# Patient Record
Sex: Male | Born: 1945 | Race: White | Hispanic: No | Marital: Married | Smoking: Never smoker
Health system: Southern US, Community
[De-identification: ages and names within clinical notes are randomized; demographics above are authoritative.]

## PROBLEM LIST (undated history)

## (undated) DIAGNOSIS — E119 Type 2 diabetes mellitus without complications: Secondary | ICD-10-CM

## (undated) DIAGNOSIS — I10 Essential (primary) hypertension: Secondary | ICD-10-CM

## (undated) DIAGNOSIS — E785 Hyperlipidemia, unspecified: Secondary | ICD-10-CM

---

## 2013-01-03 ENCOUNTER — Inpatient Hospital Stay: Payer: Self-pay | Admitting: Internal Medicine

## 2013-01-03 LAB — CK TOTAL AND CKMB (NOT AT ARMC): CK-MB: 2.5 ng/mL (ref 0.5–3.6)

## 2013-01-03 LAB — COMPREHENSIVE METABOLIC PANEL
Alkaline Phosphatase: 123 U/L (ref 50–136)
Bilirubin,Total: 0.4 mg/dL (ref 0.2–1.0)
Calcium, Total: 9.3 mg/dL (ref 8.5–10.1)
Chloride: 102 mmol/L (ref 98–107)
Co2: 26 mmol/L (ref 21–32)
EGFR (Non-African Amer.): >60
Osmolality: 295 (ref 275–301)
SGOT(AST): 15 U/L (ref 15–37)
SGPT (ALT): 22 U/L (ref 12–78)

## 2013-01-03 LAB — CBC
HCT: 49.2 % (ref 40.0–52.0)
HGB: 16.3 g/dL (ref 13.0–18.0)
MCH: 29.5 pg (ref 26.0–34.0)
MCHC: 33.1 g/dL (ref 32.0–36.0)
RBC: 5.53 10*6/uL (ref 4.40–5.90)
RDW: 13.7 % (ref 11.5–14.5)
WBC: 13.1 10*3/uL — ABNORMAL HIGH (ref 3.8–10.6)

## 2013-01-03 LAB — APTT: Activated PTT: <23 secs — ABNORMAL LOW (ref 23.6–35.9)

## 2013-01-03 LAB — LIPASE, BLOOD: Lipase: 116 U/L (ref 73–393)

## 2013-01-03 LAB — TROPONIN I: Troponin-I: <0.02 ng/mL

## 2013-01-03 LAB — MAGNESIUM: Magnesium: 2 mg/dL

## 2013-01-03 LAB — PROTIME-INR: INR: 0.9

## 2013-01-08 LAB — CULTURE, BLOOD (SINGLE)

## 2015-04-06 NOTE — Discharge Summary (Signed)
PATIENT NAME:  Jacob White, Jacob White MR#:  [id number] DATE OF BIRTH:  1946-05-19  DATE OF ADMISSION:  01/03/2013  DATE OF DISCHARGE:  01/03/2013  PRESENTING COMPLAINT: Shortness of breath.   DISCHARGE DIAGNOSES: 1.  Pleuritic chest pain suspected due to bibasilar early pneumonia.  2.  Hypertension.  3.  Diabetes.   SATURATIONS ON DISCHARGE:  96% on room air.   MEDICATIONS:   1.  Metformin 1000 mg b.i.d.  2.  Hydrochlorothiazide/losartan 12.5/50, 1 tablet daily.  3.  Aspirin 325 mg p.o. daily.  4.  Levaquin 750 mg p.o. daily for 5 more days.   DIET: Low sodium, carbohydrate controlled diet.   DISCHARGE INSTRUCTIONS:  Follow up with your primary care physician in [place] in 1 week.   LABORATORY DATA AT DISCHARGE:   1.  Blood cultures negative in 18 to 24 hours.  2.  Troponin x 3 negative.  3.  White count is 13.3, platelet count 146, H and H is normal. D-dimer is 1.86. Lipase is 116. Magnesium 2.0. PT-INR within normal limits. Electrolytes within normal limits except glucose of 386.   IMAGING STUDIES AT DISCHARGE: CT of the chest shows bibasilar atelectasis and/or pneumonia. No PE coronary or CAD disease.   BRIEF SUMMARY OF HOSPITAL COURSE: The patient is a pleasant 69 year old Caucasian gentleman with past medical history of hypertension and diabetes, comes to the Emergency Room with:  1.  Pleuritic chest pain and shortness of breath. The patient was worked up in the Emergency Room and was found to have bibasilar atelectasis versus early pneumonia. His white count was 13K.  He had low-grade fever at home. The patient's blood cultures remained negative. His sats were 96% on room air. He was started on Levaquin, which was changed to p.o. 750 mg. The patient will take 5 more days of Levaquin. He ambulated without any difficulty. Did have some mild shortness of breath.  2.  Hypertension. Continue losartan/hydrochlorothiazide.  3.  Type 2 diabetes. The patient was kept on his metformin. The  patient felt later on in the day well other than mild weakness. He ambulated without any difficulty. He is being discharged to home with followup as outpatient with his primary care physician in [place].    TIME SPENT: 40 minutes.     ____________________________ Wylie HailSona A. Allena KatzPatel, MD sap:dm D: 01/04/2013 07:53:00 ET T: 01/04/2013 09:47:37 ET JOB#: [id number]  cc: Advika Mclelland A. Allena KatzPatel, MD, <Dictator> Willow OraSONA A Marius Betts MD ELECTRONICALLY SIGNED 01/07/2013 21:32

## 2015-04-06 NOTE — H&P (Signed)
PATIENT NAME:  Jacob White, Jacob White MR#:  [id number] DATE OF BIRTH:  [birth year]  DATE OF ADMISSION:  01/03/2013  PRIMARY CARE PHYSICIAN: Does not recall her name, but the office is in [place], 6 miles from here.   REFERRING PHYSICIAN: Dr. Malachy Moan.   CHIEF COMPLAINT: Midsternal chest pain and shortness of breath.   HISTORY OF PRESENT ILLNESS: Jacob White is a 69 year old Caucasian male with a history of hypertension and diabetes mellitus. He was in his usual state of health until last evening around 9:00 p.m. he complained of shortness of breath associated with a sharp stabbing pain on the midsternal area, pleuritic in nature. It comes for just a second or two, and then goes away. Exacerbated by taking a deep breath. The pain radiates to the left neck and left shoulder. During the initial evaluation, he was found to have decreased O2 saturation reaching 87 on room air. The patient was transported by EMS to the hospital. En route, he received nitroglycerin and aspirin; however, the nitroglycerin did not change his chest pain pattern. Evaluation here in the Emergency Department with chest x-ray failed to show pneumonia. However, CTA of the chest was done and that showed no evidence of pulmonary emboli; however, there was some bibasilar nonspecific consolidation, either pneumonia or atelectasis. Blood cultures were taken and the patient was started on intravenous Levaquin.   REVIEW OF SYSTEMS:  CONSTITUTIONAL: Denies having any fever. No chills. No fatigue.  EYES: No blurring of vision. No double vision.  ENT: No hearing impairment. No sore throat. No dysphagia.  CARDIOVASCULAR: Reports sharp, brief chest pains at the mid chest area as above and shortness of breath, especially upon deep breaths. No syncope. No edema.  RESPIRATORY: Reports no cough and no hemoptysis. No sputum production. Reports chest pain and shortness of breath as above.  GASTROINTESTINAL: No abdominal pain, no vomiting and no  diarrhea.  GENITOURINARY: No dysuria. No frequency of urination.  MUSCULOSKELETAL: No joint pain or swelling. No muscular pain or swelling.  INTEGUMENTARY: No skin rash. No ulcers.  NEUROLOGY: No focal weakness. No seizure activity. No headache.  PSYCHIATRY: No anxiety. No depression,  ENDOCRINE: No polyuria or polydipsia. No heat or cold intolerance.  HEMATOLOGY: No easy bruisability. No lymph node enlargement.   PAST MEDICAL HISTORY:  1.  Diabetes mellitus, type 2.  2.  Systemic hypertension.   PAST SURGICAL HISTORY: None.   FAMILY HISTORY: His father died from lung cancer at age of 56. His mother died from a heart attack at the age of 73.   SOCIAL HABITS: Nonsmoker. No history of alcohol abuse. He may drink beer over the weekends.   SOCIAL HISTORY: He is married, living with his wife. He is still walking,  building couches for living rooms.   ADMISSION MEDICATIONS: Metformin 1000 mg twice a day and losartan with hydrochlorothiazide 50/12.5 mg once a day.   ALLERGIES: No known drug allergies.   PHYSICAL EXAMINATION:  VITAL SIGNS: Blood pressure 170/110, respiratory rate 16, pulse 73, temperature 98.2, oxygen saturation 98%.  GENERAL APPEARANCE: Elderly male lying in bed in no acute distress, healthy looking.  HEAD AND NECK: No pallor. No icterus. No cyanosis.  ENT: Ear examination revealed normal hearing, no discharge, no lesions. Nasal examination revealed no ulcers, no lesions, no discharge. Oropharyngeal area was normal without oral thrush, no ulcers, no exudates. He is edentulous and wearing dentures. Eye examination revealed normal eyelids and conjunctivae. Pupils are constricted about 3 to 4 mm, equal in size. I  could not see reactivity to light.  NECK: Supple. Trachea at midline. No thyromegaly. No cervical lymphadenopathy. No masses.  HEART: Normal S1, S2. No S3, S4. No murmur. No gallop. No carotid bruits.  LUNGS: Normal breathing pattern without use of accessory muscles.  No rales. No wheezing. No friction rub.  ABDOMEN: Soft without tenderness. No hepatosplenomegaly. No masses. No hernias.  SKIN: No ulcers. No subcutaneous nodules.  MUSCULOSKELETAL: No joint swelling. No clubbing.  NEUROLOGIC: Cranial nerves II through XII are intact. No focal motor deficit.  PSYCHIATRIC: The patient is alert and oriented x 3. Mood and affect normal.   LABORATORY FINDINGS: Chest x-ray showed no consolidation, no effusion. CTA of the chest showed no evidence of pulmonary emboli. Thoracic aortic is normal. Heart is enlarged. There is bibasilar nonspecific consolidation consistent with pneumonia or atelectasis or edema. No mediastinal or hilar lymphadenopathy. EKG showed normal sinus rhythm at rate of 67 per minute. Premature atrial complex. Poor progression of R waves in the anterior chest leads. Nonspecific ST elevation in the anterior leads, otherwise unremarkable EKG. His serum CPK was normal at 61 with normal CK-MB fraction at 2.5 and normal troponin less than 0.02. Serum glucose 386, BUN 26, creatinine 1.1, sodium 137, potassium 3.8. Lipase 116. Magnesium 2. Normal liver function tests and liver transaminases. CBC showed white count of 13,000, hemoglobin 16, hematocrit 49, platelet count 146. Prothrombin time 12, INR 0.9, APTT 23. D-dimer elevated at 1.8. ABG showed pH of 7.39, pCO2 40, pO2 at 264 and this is on FiO2 of 100%.   ASSESSMENT:  1.  Pleuritic chest pain, brief in nature, but exacerbated with deep breathing with negative  pulmonary embolism by CT of the chest. 2.  Bibasilar consolidation suspected to have pneumonia.  3.  Elevated white blood cell count or leukocytosis, likely from the pneumonia.  4.  Diabetes mellitus, type 2.  5.  Systemic hypertension.   PLAN: Will admit the patient medical floor on telemetry monitoring given his chest pain. Even though the first troponin is normal, I will continue followup on his cardiac enzymes q. 8 hours x 2. Blood cultures x 2  were taken. Intravenous Levaquin is started. For the time being, I will add aspirin and beta blocker using Coreg for additional control of his blood pressure. Continue losartan along with hydrochlorothiazide for his hypertension. Hold metformin for the next 24 to 36 hours due to receiving IV contrast for his CT scan. Place on Accu-Cheks and sliding scale. Oxygen supplementation. The patient's CODE STATUS is full code.   TIME SPENT EVALUATING THIS PATIENT: Took more than 55 minutes.    ____________________________ Carney CornersAmir M. Rudene Rearwish, MD amd:aw D: 01/03/2013 04:37:53 ET T: 01/03/2013 05:39:51 ET JOB#: [id number]  cc: Carney CornersAmir M. Rudene Rearwish, MD, <Dictator> Zollie ScaleAMIR M Adanely Reynoso MD ELECTRONICALLY SIGNED 01/03/2013 7:04

## 2015-05-25 ENCOUNTER — Encounter: Payer: Self-pay | Admitting: Internal Medicine

## 2015-06-08 ENCOUNTER — Emergency Department (HOSPITAL_COMMUNITY): Payer: Medicare Other

## 2015-06-08 ENCOUNTER — Encounter (HOSPITAL_COMMUNITY): Payer: Self-pay | Admitting: Emergency Medicine

## 2015-06-08 ENCOUNTER — Inpatient Hospital Stay (HOSPITAL_COMMUNITY)
Admission: EM | Admit: 2015-06-08 | Discharge: 2015-06-13 | DRG: 065 | Disposition: A | Discharge status: Rehabilitation Facility | Payer: Medicare Other | Attending: Internal Medicine | Admitting: Internal Medicine

## 2015-06-08 DIAGNOSIS — R410 Disorientation, unspecified: Secondary | ICD-10-CM | POA: Diagnosis not present

## 2015-06-08 DIAGNOSIS — I739 Peripheral vascular disease, unspecified: Secondary | ICD-10-CM | POA: Diagnosis present

## 2015-06-08 DIAGNOSIS — R7881 Bacteremia: Secondary | ICD-10-CM | POA: Diagnosis present

## 2015-06-08 DIAGNOSIS — M25511 Pain in right shoulder: Secondary | ICD-10-CM | POA: Diagnosis not present

## 2015-06-08 DIAGNOSIS — I6330 Cerebral infarction due to thrombosis of unspecified cerebral artery: Secondary | ICD-10-CM | POA: Diagnosis present

## 2015-06-08 DIAGNOSIS — G8194 Hemiplegia, unspecified affecting left nondominant side: Secondary | ICD-10-CM | POA: Diagnosis present

## 2015-06-08 DIAGNOSIS — E860 Dehydration: Secondary | ICD-10-CM

## 2015-06-08 DIAGNOSIS — B9562 Methicillin resistant Staphylococcus aureus infection as the cause of diseases classified elsewhere: Secondary | ICD-10-CM | POA: Diagnosis present

## 2015-06-08 DIAGNOSIS — I639 Cerebral infarction, unspecified: Secondary | ICD-10-CM | POA: Diagnosis not present

## 2015-06-08 DIAGNOSIS — G8190 Hemiplegia, unspecified affecting unspecified side: Secondary | ICD-10-CM | POA: Diagnosis not present

## 2015-06-08 DIAGNOSIS — R509 Fever, unspecified: Secondary | ICD-10-CM | POA: Diagnosis not present

## 2015-06-08 DIAGNOSIS — E785 Hyperlipidemia, unspecified: Secondary | ICD-10-CM | POA: Diagnosis present

## 2015-06-08 DIAGNOSIS — E118 Type 2 diabetes mellitus with unspecified complications: Secondary | ICD-10-CM | POA: Diagnosis present

## 2015-06-08 DIAGNOSIS — M79601 Pain in right arm: Secondary | ICD-10-CM | POA: Diagnosis not present

## 2015-06-08 DIAGNOSIS — R1310 Dysphagia, unspecified: Secondary | ICD-10-CM | POA: Diagnosis present

## 2015-06-08 DIAGNOSIS — I10 Essential (primary) hypertension: Secondary | ICD-10-CM | POA: Diagnosis present

## 2015-06-08 DIAGNOSIS — E119 Type 2 diabetes mellitus without complications: Secondary | ICD-10-CM | POA: Diagnosis not present

## 2015-06-08 DIAGNOSIS — Z79899 Other long term (current) drug therapy: Secondary | ICD-10-CM | POA: Diagnosis not present

## 2015-06-08 DIAGNOSIS — R0602 Shortness of breath: Secondary | ICD-10-CM

## 2015-06-08 DIAGNOSIS — E876 Hypokalemia: Secondary | ICD-10-CM | POA: Diagnosis not present

## 2015-06-08 DIAGNOSIS — B9561 Methicillin susceptible Staphylococcus aureus infection as the cause of diseases classified elsewhere: Secondary | ICD-10-CM | POA: Diagnosis not present

## 2015-06-08 DIAGNOSIS — N179 Acute kidney failure, unspecified: Secondary | ICD-10-CM | POA: Diagnosis present

## 2015-06-08 DIAGNOSIS — R0781 Pleurodynia: Secondary | ICD-10-CM | POA: Diagnosis not present

## 2015-06-08 DIAGNOSIS — E871 Hypo-osmolality and hyponatremia: Secondary | ICD-10-CM | POA: Diagnosis present

## 2015-06-08 DIAGNOSIS — R531 Weakness: Secondary | ICD-10-CM

## 2015-06-08 HISTORY — DX: Type 2 diabetes mellitus without complications: E11.9

## 2015-06-08 HISTORY — DX: Essential (primary) hypertension: I10

## 2015-06-08 HISTORY — DX: Hyperlipidemia, unspecified: E78.5

## 2015-06-08 LAB — COMPREHENSIVE METABOLIC PANEL
ALBUMIN: 2.8 g/dL — AB (ref 3.5–5.0)
ALT: 19 U/L (ref 17–63)
AST: 21 U/L (ref 15–41)
Alkaline Phosphatase: 107 U/L (ref 38–126)
Anion gap: 13 (ref 5–15)
BUN: 40 mg/dL — ABNORMAL HIGH (ref 6–20)
CALCIUM: 8.8 mg/dL — AB (ref 8.9–10.3)
CO2: 26 mmol/L (ref 22–32)
Chloride: 91 mmol/L — ABNORMAL LOW (ref 101–111)
Creatinine, Ser: 1.31 mg/dL — ABNORMAL HIGH (ref 0.61–1.24)
GFR calc Af Amer: >60 mL/min (ref >60)
GFR, EST NON AFRICAN AMERICAN: 54 mL/min — AB (ref >60)
GLUCOSE: 240 mg/dL — AB (ref 65–99)
Potassium: 3.7 mmol/L (ref 3.5–5.1)
Sodium: 130 mmol/L — ABNORMAL LOW (ref 135–145)
Total Bilirubin: 0.8 mg/dL (ref 0.3–1.2)
Total Protein: 7.4 g/dL (ref 6.5–8.1)

## 2015-06-08 LAB — CBC WITH DIFFERENTIAL/PLATELET
BASOS PCT: 0 % (ref 0–1)
Basophils Absolute: 0 10*3/uL (ref 0.0–0.1)
EOS ABS: 0 10*3/uL (ref 0.0–0.7)
Eosinophils Relative: 0 % (ref 0–5)
HCT: 39.3 % (ref 39.0–52.0)
Hemoglobin: 13.7 g/dL (ref 13.0–17.0)
Lymphocytes Relative: 5 % — ABNORMAL LOW (ref 12–46)
Lymphs Abs: 0.5 10*3/uL — ABNORMAL LOW (ref 0.7–4.0)
MCH: 29.6 pg (ref 26.0–34.0)
MCHC: 34.9 g/dL (ref 30.0–36.0)
MCV: 84.9 fL (ref 78.0–100.0)
Monocytes Absolute: 0.7 10*3/uL (ref 0.1–1.0)
Monocytes Relative: 8 % (ref 3–12)
Neutro Abs: 8.3 10*3/uL — ABNORMAL HIGH (ref 1.7–7.7)
Neutrophils Relative %: 87 % — ABNORMAL HIGH (ref 43–77)
PLATELETS: 184 10*3/uL (ref 150–400)
RBC: 4.63 MIL/uL (ref 4.22–5.81)
RDW: 13.3 % (ref 11.5–15.5)
WBC: 9.6 10*3/uL (ref 4.0–10.5)

## 2015-06-08 LAB — I-STAT TROPONIN, ED: Troponin i, poc: 0.01 ng/mL (ref 0.00–0.08)

## 2015-06-08 LAB — GLUCOSE, CAPILLARY: Glucose-Capillary: 223 mg/dL — ABNORMAL HIGH (ref 65–99)

## 2015-06-08 LAB — POC OCCULT BLOOD, ED: FECAL OCCULT BLD: NEGATIVE

## 2015-06-08 MED ORDER — SENNOSIDES-DOCUSATE SODIUM 8.6-50 MG PO TABS
1.0000 | ORAL_TABLET | Freq: Every evening | ORAL | Status: DC | PRN
  Administered 2015-06-11: 1 via ORAL
  Filled 2015-06-08: qty 1

## 2015-06-08 MED ORDER — SODIUM CHLORIDE 0.9 % IV SOLN: INTRAVENOUS | Status: AC

## 2015-06-08 MED ORDER — ENSURE ENLIVE PO LIQD
237.0000 mL | Freq: Two times a day (BID) | ORAL | Status: DC
  Administered 2015-06-09 – 2015-06-13 (×5): 237 mL via ORAL

## 2015-06-08 MED ORDER — INSULIN ASPART 100 UNIT/ML ~~LOC~~ SOLN
0.0000 [IU] | Freq: Every day | SUBCUTANEOUS | Status: DC
  Administered 2015-06-08: 2 [IU] via SUBCUTANEOUS

## 2015-06-08 MED ORDER — ONDANSETRON HCL 4 MG PO TABS: 4.0000 mg | ORAL_TABLET | Freq: Four times a day (QID) | ORAL | Status: DC | PRN

## 2015-06-08 MED ORDER — LISINOPRIL 20 MG PO TABS
20.0000 mg | ORAL_TABLET | Freq: Every day | ORAL | Status: DC
  Administered 2015-06-09 – 2015-06-13 (×5): 20 mg via ORAL
  Filled 2015-06-08 (×5): qty 1

## 2015-06-08 MED ORDER — HYDROCHLOROTHIAZIDE 25 MG PO TABS
25.0000 mg | ORAL_TABLET | Freq: Every day | ORAL | Status: DC
  Administered 2015-06-09 – 2015-06-13 (×5): 25 mg via ORAL
  Filled 2015-06-08 (×5): qty 1

## 2015-06-08 MED ORDER — ACETAMINOPHEN 650 MG RE SUPP: 650.0000 mg | Freq: Four times a day (QID) | RECTAL | Status: DC | PRN

## 2015-06-08 MED ORDER — SODIUM CHLORIDE 0.9 % IV SOLN
INTRAVENOUS | Status: DC
  Administered 2015-06-08: via INTRAVENOUS
  Administered 2015-06-09: 75 mL/h via INTRAVENOUS
  Administered 2015-06-09: 06:00:00 via INTRAVENOUS
  Administered 2015-06-10: 75 mL/h via INTRAVENOUS

## 2015-06-08 MED ORDER — METFORMIN HCL 500 MG PO TABS
1000.0000 mg | ORAL_TABLET | Freq: Two times a day (BID) | ORAL | Status: DC
  Administered 2015-06-09 – 2015-06-11 (×5): 1000 mg via ORAL
  Filled 2015-06-08 (×7): qty 2

## 2015-06-08 MED ORDER — OXYCODONE HCL 5 MG PO TABS
5.0000 mg | ORAL_TABLET | ORAL | Status: DC | PRN
  Administered 2015-06-08 – 2015-06-09 (×2): 5 mg via ORAL
  Filled 2015-06-08 (×2): qty 1

## 2015-06-08 MED ORDER — ONDANSETRON HCL 4 MG/2ML IJ SOLN: 4.0000 mg | Freq: Four times a day (QID) | INTRAMUSCULAR | Status: DC | PRN

## 2015-06-08 MED ORDER — ACETAMINOPHEN 325 MG PO TABS
650.0000 mg | ORAL_TABLET | Freq: Four times a day (QID) | ORAL | Status: DC | PRN
  Administered 2015-06-10 – 2015-06-11 (×2): 650 mg via ORAL
  Filled 2015-06-08 (×2): qty 2

## 2015-06-08 MED ORDER — SODIUM CHLORIDE 0.9 % IV BOLUS (SEPSIS)
500.0000 mL | Freq: Once | INTRAVENOUS | Status: AC
  Administered 2015-06-08: 500 mL via INTRAVENOUS

## 2015-06-08 MED ORDER — PRAVASTATIN SODIUM 40 MG PO TABS
40.0000 mg | ORAL_TABLET | Freq: Every day | ORAL | Status: DC
  Administered 2015-06-08 – 2015-06-12 (×5): 40 mg via ORAL
  Filled 2015-06-08 (×6): qty 1

## 2015-06-08 MED ORDER — ACETAMINOPHEN 325 MG PO TABS
650.0000 mg | ORAL_TABLET | Freq: Once | ORAL | Status: AC
  Administered 2015-06-08: 650 mg via ORAL
  Filled 2015-06-08: qty 2

## 2015-06-08 MED ORDER — ALUM & MAG HYDROXIDE-SIMETH 200-200-20 MG/5ML PO SUSP
30.0000 mL | Freq: Four times a day (QID) | ORAL | Status: DC | PRN
Start: 2015-06-08 — End: 2015-06-13

## 2015-06-08 MED ORDER — SODIUM CHLORIDE 0.9 % IJ SOLN
3.0000 mL | Freq: Two times a day (BID) | INTRAMUSCULAR | Status: DC
Start: 2015-06-08 — End: 2015-06-13
  Administered 2015-06-10 – 2015-06-12 (×3): 3 mL via INTRAVENOUS

## 2015-06-08 MED ORDER — ENOXAPARIN SODIUM 40 MG/0.4ML ~~LOC~~ SOLN
40.0000 mg | SUBCUTANEOUS | Status: DC
  Administered 2015-06-08 – 2015-06-12 (×5): 40 mg via SUBCUTANEOUS
  Filled 2015-06-08 (×6): qty 0.4

## 2015-06-08 MED ORDER — LISINOPRIL-HYDROCHLOROTHIAZIDE 20-25 MG PO TABS: 1.0000 | ORAL_TABLET | Freq: Every day | ORAL | Status: DC

## 2015-06-08 MED ORDER — INSULIN ASPART 100 UNIT/ML ~~LOC~~ SOLN
0.0000 [IU] | Freq: Three times a day (TID) | SUBCUTANEOUS | Status: DC
  Administered 2015-06-09: 3 [IU] via SUBCUTANEOUS
  Administered 2015-06-09: 11 [IU] via SUBCUTANEOUS
  Administered 2015-06-09: 5 [IU] via SUBCUTANEOUS
  Administered 2015-06-10 (×2): 3 [IU] via SUBCUTANEOUS
  Administered 2015-06-10: 2 [IU] via SUBCUTANEOUS
  Administered 2015-06-11: 3 [IU] via SUBCUTANEOUS
  Administered 2015-06-11 – 2015-06-12 (×4): 2 [IU] via SUBCUTANEOUS
  Administered 2015-06-13: 5 [IU] via SUBCUTANEOUS
  Administered 2015-06-13: 2 [IU] via SUBCUTANEOUS

## 2015-06-08 NOTE — ED Notes (Signed)
Per [employer], doctors office called out ems for possible stroke, upon ems arrival, pt is AAOX4, equal grip strengths. Pt states last week he injured his arm while mowing the lawn. Pt has had neck pain and R shoulder pain since then. Pt unable to life his head up. Pt also states he feels somewhat disoriented. Pt answers all orientation questions correctly.

## 2015-06-08 NOTE — ED Notes (Signed)
Pt still unable to use the restroom.

## 2015-06-08 NOTE — ED Provider Notes (Signed)
CSN: [id number]     Arrival date & time 06/08/15  1550 History   First MD Initiated Contact with Patient 06/08/15 1552     Chief Complaint  Patient presents with  . Neck Pain  . Shoulder Pain  . Weakness     (Consider location/radiation/quality/duration/timing/severity/associated sxs/prior Treatment) Patient is a 69 y.o. male presenting with arm injury. The history is provided by the patient.  Arm Injury Location:  Shoulder Time since incident:  1 week Injury: yes   Mechanism of injury comment:  Tugged his arm while pulling lawn mower Shoulder location:  R shoulder Pain details:    Quality:  Unable to specify   Radiates to: right lateral neck, and right upper arm.   Severity:  Moderate   Onset quality:  Sudden   Duration:  1 week   Timing:  Constant   Progression:  Unchanged Chronicity:  New Dislocation: no   Prior injury to area:  No Relieved by:  Nothing Worsened by:  Movement Ineffective treatments: narcotics. Associated symptoms: no fever and no neck pain     Past Medical History  Diagnosis Date  . Diabetes mellitus without complication   . Hypertension   . Hyperlipemia    History reviewed. No pertinent past surgical history. No family history on file. History  Substance Use Topics  . Smoking status: Never Smoker   . Smokeless tobacco: Not on file  . Alcohol Use: No    Review of Systems  Constitutional: Negative for fever.  HENT: Negative for drooling and rhinorrhea.   Eyes: Negative for pain.  Respiratory: Positive for shortness of breath. Negative for cough.   Cardiovascular: Negative for leg swelling.  Gastrointestinal: Negative for nausea, vomiting, abdominal pain and diarrhea.  Genitourinary: Negative for dysuria and hematuria.  Musculoskeletal: Negative for gait problem and neck pain.       Right shoulder pain  Skin: Negative for color change.  Neurological: Negative for numbness and headaches.  Hematological: Negative for adenopathy.   Psychiatric/Behavioral: Negative for behavioral problems.  All other systems reviewed and are negative.     Allergies  Review of patient's allergies indicates no known allergies.  Home Medications   Prior to Admission medications   Not on File   BP 137/109 mmHg  Pulse 96  Temp(Src) 101.2 F (38.4 C) (Oral)  Resp 18  Ht  (1.676 m)  Wt 135 lb (61.236 kg)  BMI 21.80 kg/m2  SpO2 94% Physical Exam  Constitutional: He appears well-developed and well-nourished.  HENT:  Head: Normocephalic and atraumatic.  Right Ear: External ear normal.  Left Ear: External ear normal.  Nose: Nose normal.  Mouth/Throat: Oropharynx is clear and moist. No oropharyngeal exudate.  Mild abrasion of the antihelix of the left ear.  Eyes: Conjunctivae and EOM are normal. Pupils are equal, round, and reactive to light.  Neck: Normal range of motion. Neck supple.  Cardiovascular: Normal rate, regular rhythm, normal heart sounds and intact distal pulses.  Exam reveals no gallop and no friction rub.   No murmur heard. Pulmonary/Chest: Effort normal and breath sounds normal. No respiratory distress. He has no wheezes. He exhibits tenderness (mild tenderness to palpation of the right upper axillary area.).  Abdominal: Soft. Bowel sounds are normal. He exhibits no distension. There is no tenderness. There is no rebound and no guarding.  Genitourinary: Penis normal.  Mild prominent lymph node in the left inguinal area with mild tenderness.  Normal appearing external rectum. Brown stool, no gross blood noted.  Musculoskeletal: Normal range of motion. He exhibits edema and tenderness.  Trace edema in distal bilateral lower extremities.  Mild tenderness to palpation of the right posterior shoulder and right upper arm. Pain seems to travel up to the right trapezius and right paracervical area. No significant vertebral tenderness is noted.  Normal range of motion of the neck.    Neurological: He is alert.   alert, oriented x2, doesn't know year but does know month speech: normal in context and clarity memory: intact grossly cranial nerves II-XII: intact motor strength: full proximally and distally  sensation: intact to light touch diffusely  cerebellar: finger-to-nose and heel-to-shin intact Gait: Unstable when standing, unable to ambulate   Skin: Skin is warm and dry.  Psychiatric: He has a normal mood and affect. His behavior is normal.  Nursing note and vitals reviewed.   ED Course  Procedures (including critical care time) Labs Review Labs Reviewed  CBC WITH DIFFERENTIAL/PLATELET - Abnormal; Notable for the following:    Neutrophils Relative % 87 (*)    Neutro Abs 8.3 (*)    Lymphocytes Relative 5 (*)    Lymphs Abs 0.5 (*)    All other components within normal limits  COMPREHENSIVE METABOLIC PANEL - Abnormal; Notable for the following:    Sodium 130 (*)    Chloride 91 (*)    Glucose, Bld 240 (*)    BUN 40 (*)    Creatinine, Ser 1.31 (*)    Calcium 8.8 (*)    Albumin 2.8 (*)    GFR calc non Af Amer 54 (*)    All other components within normal limits  CULTURE, BLOOD (ROUTINE X 2)  CULTURE, BLOOD (ROUTINE X 2)  URINALYSIS, ROUTINE W REFLEX MICROSCOPIC (NOT AT [facility])  OCCULT BLOOD X 1 CARD TO LAB, STOOL  I-STAT TROPOININ, ED  POC OCCULT BLOOD, ED    Imaging Review Dg Chest 2 View  06/08/2015   CLINICAL DATA:  Patient was moving picnic table. Felt sharp pain on the right shoulder. Pain under the breast on the anterior sign. Shortness of breath. History of hypertension, diabetes, smoking.  EXAM: CHEST  2 VIEW  COMPARISON:  None.  FINDINGS: The heart size and mediastinal contours are within normal limits. Both lungs are clear. The visualized skeletal structures are unremarkable.  IMPRESSION: No active cardiopulmonary disease.   Electronically Signed   By: Norva Pavlov M.D.   On: 06/08/2015 17:02   Dg Ribs Unilateral Right  06/08/2015   CLINICAL DATA:  Right chest pain  while moving picnic table, initial encounter  EXAM: RIGHT RIBS - 2 VIEW  COMPARISON:  None.  FINDINGS: No fracture or other bone lesions are seen involving the ribs.  IMPRESSION: No acute abnormality noted.   Electronically Signed   By: Alcide Clever M.D.   On: 06/08/2015 17:02   Dg Shoulder Right  06/08/2015   CLINICAL DATA:  69 year old male with acute onset right shoulder pain with heavy lifting. Initial encounter.  EXAM: RIGHT SHOULDER - 2+ VIEW  COMPARISON:  [facility] Portable chest radiograph 01/03/2013  FINDINGS: Two views of the right shoulder. No glenohumeral joint dislocation. Proximal right humerus intact. Stable smooth exostosis at the midshaft which might be sequelae of remote trauma or related to muscle insertion. No right clavicle or scapula fracture. Visible right ribs and lung parenchyma within normal limits.  IMPRESSION: No acute osseous abnormality identified at the right shoulder.   Electronically Signed   By: Althea Grimmer.D.  On: 06/08/2015 17:02   Ct Head Wo Contrast  06/08/2015   CLINICAL DATA:  Confusion.  EXAM: CT HEAD WITHOUT CONTRAST  TECHNIQUE: Contiguous axial images were obtained from the base of the skull through the vertex without intravenous contrast.  COMPARISON:  None.  FINDINGS: No acute intracranial abnormality. Specifically, no hemorrhage, hydrocephalus, mass lesion, acute infarction, or significant intracranial injury. No acute calvarial abnormality. Mild volume loss, compatible with patient's age. Visualized paranasal sinuses and mastoids clear. Orbital soft tissues unremarkable.  IMPRESSION: No acute intracranial abnormality.   Electronically Signed   By: Charlett Nose M.D.   On: 06/08/2015 17:10     EKG Interpretation   Date/Time:  Friday June 08 2015 16:02:53 EDT Ventricular Rate:  99 PR Interval:  170 QRS Duration: 109 QT Interval:  346 QTC Calculation: 444 R Axis:   56 Text Interpretation:  Age not entered, assumed to be  69 years old  for  purpose of ECG interpretation Sinus rhythm Baseline wander in lead(s) I V6  Confirmed by Kyan Giannone  MD, Muadh Creasy ([id number]) on 06/08/2015 4:12:32 PM      MDM   Final diagnoses:  SOB (shortness of breath)  Other specified fever  Right shoulder pain  Weakness    4:12 PM 69 y.o. male w hx of DM, HTN, HLP who presents from his doctor's office with generalized weakness and confusion. The patient is complaining of right upper arm and shoulder pain which he has had for about one week after tugging a machine while he was mowing. He was seen in his doctor's office today and found to have generalized weakness and some confusion. He was sent here for evaluation. On exam he complains of some right arm and shoulder pain but denies any headaches or chest pain. He states that he has had some mild shortness of breath for the last 1-2 weeks. He was incidentally found to be febrile here. Vital signs otherwise unremarkable. Will give Tylenol get screening lab work and imaging. He denies HA.   5:41 PM family now here. They state that he is also having chills and decreased oral intake. He is also now reporting some intermittent light red blood in his stool over the last 2-3 weeks. Will hemeoccult.   Still awaiting UA, but given fever and inability to ambulate pt will require admission. Hospitalist to admit.   Purvis Sheffield, MD 06/09/15 1159

## 2015-06-08 NOTE — ED Notes (Signed)
Pt returned from radiology.

## 2015-06-08 NOTE — H&P (Signed)
History and Physical  Jacob White [facility]:[id number] DOB: [birth year] DOA: 06/08/2015  Referring physician: Dr Romeo Apple, ED physician PCP: No primary care provider on file.   Chief Complaint: Weakness, confusion  HPI: Jacob White is a 69 y.o. male  With a history of type 2 diabetes on oral medication, hypertension, hyperlipidemia. He presents to the hospital after seeing his primary care physician earlier today this morning due to right upper extremity pain, weakness, confusion. The patient injured his right arm while mowing the lawn and has had right neck pain and shoulder pain since that time and diagnosed with right extremity musculoskeletal pain by the emergency department here. However, upon history the patient has described weakness and difficulty ambulating over the past week which has been worsening over the past several days. Additionally, his wife states that he has been "talking out of his head". His weakness is worse with ambulation and improved with rest. There are no focal deficits according to his wife. Additionally, they deny slurred speech. He was also found to be febrile in the emergency department   Review of Systems:   Pt complains of strong foul urine, mild dysuria, mild chills.  Pt denies any shortness of breath, cough, chest pain, palpitations, abdominal pain, nausea, vomiting, decreased appetite, rash, insect bite, tick bites, headache, blurred vision, vision changes.  Review of systems are otherwise negative  Past Medical History  Diagnosis Date  . Diabetes mellitus without complication   . Hypertension   . Hyperlipemia    History reviewed. No pertinent past surgical history. Social History:  reports that he has never smoked. He does not have any smokeless tobacco history on file. He reports that he does not drink alcohol. His drug history is not on file. Patient lives at home & is able to participate in activities of daily living   No Known Allergies  History  reviewed. No pertinent family history. per the patient, there is a family history of diabetes and hypertension  Prior to Admission medications   Medication Sig Start Date End Date Taking? Authorizing Provider  lisinopril-hydrochlorothiazide (PRINZIDE,ZESTORETIC) 20-25 MG per tablet Take 1 tablet by mouth daily.   Yes Historical Provider, MD  meloxicam (MOBIC) 15 MG tablet Take 15 mg by mouth daily as needed for pain.  06/03/15 06/17/15 Yes Historical Provider, MD  metFORMIN (GLUCOPHAGE) 1000 MG tablet Take 1,000 mg by mouth 2 (two) times daily with a meal.   Yes Historical Provider, MD  Omega-3 Fatty Acids (OMEGA 3 PO) Take 1 tablet by mouth daily.   Yes Historical Provider, MD  oxyCODONE-acetaminophen (PERCOCET/ROXICET) 5-325 MG per tablet Take 1 tablet by mouth daily as needed for moderate pain or severe pain.  06/03/15  Yes Historical Provider, MD  pravastatin (PRAVACHOL) 40 MG tablet Take 40 mg by mouth daily.   Yes Historical Provider, MD    Physical Exam: BP 127/74 mmHg  Pulse 88  Temp(Src) 99 F (37.2 C) (Oral)  Resp 20  Ht 5\' 6"  (1.676 m)  Wt 61.236 kg (135 lb)  BMI 21.80 kg/m2  SpO2 94%  General: Older Caucasian male. Awake and alert and oriented x3. No acute cardiopulmonary distress.  Eyes: Pupils equal, round, reactive to light. Extraocular muscles are intact. Sclerae anicteric and noninjected.  ENT:  Dry mucosal membranes. No mucosal lesions. Neck: Neck supple without lymphadenopathy. No carotid bruits. No masses palpated.  Cardiovascular: Regular rate with normal S1-S2 sounds. No murmurs, rubs, gallops auscultated. No JVD.  Respiratory: Good respiratory effort with no wheezes,  rales, rhonchi. Lungs clear to auscultation bilaterally.  Abdomen: Soft, nontender, nondistended. Active bowel sounds. No masses or hepatosplenomegaly. The patient does have suprapubic tenderness.  Skin: Dry, warm to touch. 2+ dorsalis pedis and radial pulses. Musculoskeletal: No calf or leg pain. All  major joints not erythematous nontender. Patient is tender to palpation in the right trapezoid, rhomboids.  Psychiatric: Intact judgment and insight.  Neurologic: No focal neurological deficits. Cranial nerves II through XII are grossly intact.           Labs on Admission:  Basic Metabolic Panel:  Recent Labs Lab 06/08/15 1718  NA 130*  K 3.7  CL 91*  CO2 26  GLUCOSE 240*  BUN 40*  CREATININE 1.31*  CALCIUM 8.8*   Liver Function Tests:  Recent Labs Lab 06/08/15 1718  AST 21  ALT 19  ALKPHOS 107  BILITOT 0.8  PROT 7.4  ALBUMIN 2.8*   No results for input(s): LIPASE, AMYLASE in the last 168 hours. No results for input(s): AMMONIA in the last 168 hours. CBC:  Recent Labs Lab 06/08/15 1718  WBC 9.6  NEUTROABS 8.3*  HGB 13.7  HCT 39.3  MCV 84.9  PLT 184   Cardiac Enzymes: No results for input(s): CKTOTAL, CKMB, CKMBINDEX, TROPONINI in the last 168 hours.  BNP (last 3 results) No results for input(s): BNP in the last 8760 hours.  ProBNP (last 3 results) No results for input(s): PROBNP in the last 8760 hours.  CBG: No results for input(s): GLUCAP in the last 168 hours.  Radiological Exams on Admission: Dg Chest 2 View  06/08/2015   CLINICAL DATA:  Patient was moving picnic table. Felt sharp pain on the right shoulder. Pain under the breast on the anterior sign. Shortness of breath. History of hypertension, diabetes, smoking.  EXAM: CHEST  2 VIEW  COMPARISON:  None.  FINDINGS: The heart size and mediastinal contours are within normal limits. Both lungs are clear. The visualized skeletal structures are unremarkable.  IMPRESSION: No active cardiopulmonary disease.   Electronically Signed   By: Norva Pavlov M.D.   On: 06/08/2015 17:02   Dg Ribs Unilateral Right  06/08/2015   CLINICAL DATA:  Right chest pain while moving picnic table, initial encounter  EXAM: RIGHT RIBS - 2 VIEW  COMPARISON:  None.  FINDINGS: No fracture or other bone lesions are seen  involving the ribs.  IMPRESSION: No acute abnormality noted.   Electronically Signed   By: Alcide Clever M.D.   On: 06/08/2015 17:02   Dg Shoulder Right  06/08/2015   CLINICAL DATA:  69 year old male with acute onset right shoulder pain with heavy lifting. Initial encounter.  EXAM: RIGHT SHOULDER - 2+ VIEW  COMPARISON:  [facility] Portable chest radiograph 01/03/2013  FINDINGS: Two views of the right shoulder. No glenohumeral joint dislocation. Proximal right humerus intact. Stable smooth exostosis at the midshaft which might be sequelae of remote trauma or related to muscle insertion. No right clavicle or scapula fracture. Visible right ribs and lung parenchyma within normal limits.  IMPRESSION: No acute osseous abnormality identified at the right shoulder.   Electronically Signed   By: Odessa Fleming M.D.   On: 06/08/2015 17:02   Ct Head Wo Contrast  06/08/2015   CLINICAL DATA:  Confusion.  EXAM: CT HEAD WITHOUT CONTRAST  TECHNIQUE: Contiguous axial images were obtained from the base of the skull through the vertex without intravenous contrast.  COMPARISON:  None.  FINDINGS: No acute intracranial abnormality. Specifically, no  hemorrhage, hydrocephalus, mass lesion, acute infarction, or significant intracranial injury. No acute calvarial abnormality. Mild volume loss, compatible with patient's age. Visualized paranasal sinuses and mastoids clear. Orbital soft tissues unremarkable.  IMPRESSION: No acute intracranial abnormality.   Electronically Signed   By: Charlett Nose M.D.   On: 06/08/2015 17:10    EKG: Independently reviewed. Sinus rhythm with a rate of 99. Normal intervals. EDC noted. No acute ST changes.  Assessment/Plan Present on Admission:  . Confusion . Dehydration  This patient was discussed with the ED physician, including pertinent vitals, physical exam findings, labs, and imaging.  We also discussed care given by the ED provider.  #1 confusion #2 dehydration #3  generalized weakness #4 right upper extremity pain #5 diabetes type 2 #6 hypertension  Admit to telemetry The patient may have a urinary tract infection, which may explain his confusion, dehydration, generalized weakness. Patient has not been able to urinate, therefore we will give him an additional liter of fluid given over 4 hours. Urinalysis to be done when the patient able to urinate. Blood cultures obtained in the emergency department Patient is stable, therefore will hold off on treating the patient with antibiotics, but treat the patient symptomatically with Tylenol Physical therapy for his right upper extremity pain Continue IV fluids for dehydration Recheck metabolic panel in the morning Sign scale insulin for elevated blood sugars  DVT prophylaxis: Lovenox  Consultants: None  Code Status: Full code  Family Communication: Wife in the room   Disposition Plan: Pending   Levie Heritage, DO [facility] Pager [phone]

## 2015-06-08 NOTE — ED Notes (Signed)
Pt given urinal and pt is going to try and use the restroom.

## 2015-06-09 ENCOUNTER — Inpatient Hospital Stay (HOSPITAL_COMMUNITY): Payer: Medicare Other

## 2015-06-09 DIAGNOSIS — N179 Acute kidney failure, unspecified: Secondary | ICD-10-CM

## 2015-06-09 DIAGNOSIS — R7881 Bacteremia: Secondary | ICD-10-CM

## 2015-06-09 LAB — GLUCOSE, CAPILLARY
Glucose-Capillary: 190 mg/dL — ABNORMAL HIGH (ref 65–99)
Glucose-Capillary: 317 mg/dL — ABNORMAL HIGH (ref 65–99)
Glucose-Capillary: 229 mg/dL — ABNORMAL HIGH (ref 65–99)
Glucose-Capillary: 134 mg/dL — ABNORMAL HIGH (ref 65–99)

## 2015-06-09 LAB — URINALYSIS, ROUTINE W REFLEX MICROSCOPIC
Bilirubin Urine: NEGATIVE
Glucose, UA: >1000 mg/dL — AB
Ketones, ur: 15 mg/dL — AB
Nitrite: NEGATIVE
Protein, ur: 100 mg/dL — AB
SPECIFIC GRAVITY, URINE: 1.021 (ref 1.005–1.030)
UROBILINOGEN UA: 1 mg/dL (ref 0.0–1.0)
pH: 5.5 (ref 5.0–8.0)

## 2015-06-09 LAB — BASIC METABOLIC PANEL
Anion gap: 12 (ref 5–15)
BUN: 29 mg/dL — AB (ref 6–20)
CO2: 21 mmol/L — AB (ref 22–32)
Calcium: 8 mg/dL — ABNORMAL LOW (ref 8.9–10.3)
Chloride: 98 mmol/L — ABNORMAL LOW (ref 101–111)
Creatinine, Ser: 1.1 mg/dL (ref 0.61–1.24)
GFR calc Af Amer: >60 mL/min (ref >60)
GFR calc non Af Amer: >60 mL/min (ref >60)
GLUCOSE: 173 mg/dL — AB (ref 65–99)
Potassium: 3.5 mmol/L (ref 3.5–5.1)
Sodium: 131 mmol/L — ABNORMAL LOW (ref 135–145)

## 2015-06-09 LAB — URINE MICROSCOPIC-ADD ON

## 2015-06-09 MED ORDER — SODIUM CHLORIDE 0.9 % IV BOLUS (SEPSIS)
500.0000 mL | Freq: Once | INTRAVENOUS | Status: AC
  Administered 2015-06-09: 500 mL via INTRAVENOUS

## 2015-06-09 MED ORDER — VANCOMYCIN HCL IN DEXTROSE 1-5 GM/200ML-% IV SOLN
1000.0000 mg | Freq: Once | INTRAVENOUS | Status: AC
  Administered 2015-06-09: 1000 mg via INTRAVENOUS
  Filled 2015-06-09: qty 200

## 2015-06-09 MED ORDER — FLUCONAZOLE 200 MG PO TABS
200.0000 mg | ORAL_TABLET | Freq: Every day | ORAL | Status: DC
  Administered 2015-06-09 – 2015-06-13 (×5): 200 mg via ORAL
  Filled 2015-06-09 (×5): qty 1

## 2015-06-09 MED ORDER — ACETAMINOPHEN 160 MG/5ML PO SOLN
500.0000 mg | Freq: Four times a day (QID) | ORAL | Status: DC | PRN
  Filled 2015-06-09: qty 20.3

## 2015-06-09 MED ORDER — VANCOMYCIN HCL 500 MG IV SOLR
500.0000 mg | Freq: Two times a day (BID) | INTRAVENOUS | Status: DC
  Administered 2015-06-10 – 2015-06-12 (×5): 500 mg via INTRAVENOUS
  Filled 2015-06-09 (×7): qty 500

## 2015-06-09 NOTE — Progress Notes (Signed)
CRITICAL VALUE ALERT  Critical value received:  Gram + cocci in anerobic tube  Date of notification:  6/25  Time of notification:  1050  Critical value read back:Yes.    Nurse who received alert:  Greta Doom RN   MD notified (1st page):  MD Benjamine Mola  Time of first page:  1055  MD notified (2nd page):  Time of second page:  Responding MD:  MD Benjamine Mola  Time MD responded:  [phone]

## 2015-06-09 NOTE — Evaluation (Signed)
Physical Therapy Evaluation Patient Details Name: Jacob White MRN: [id number] DOB: [birth year] Today's Date: 06/09/2015   History of Present Illness  With a history of type 2 diabetes on oral medication, hypertension, hyperlipidemia. He presents to the hospital after seeing his primary care physician earlier today this morning due to right upper extremity pain, weakness, confusion.   Clinical Impression  Pt admitted with above diagnosis. Pt currently with functional limitations due to the deficits listed below (see PT Problem List). Pt will benefit from skilled PT to increase their independence and safety with mobility to allow discharge to the venue listed below.  Pt with general unsteadiness with posterior sway at times.  Pt ambulated with chair follow and had to sit after 40' due to lightheadedness and BP 99/62.  Family can provide 24 hour A.  Recommend RW and HHPT.     Follow Up Recommendations Home health PT;Supervision for mobility/OOB;Supervision/Assistance - 24 hour    Equipment Recommendations  Rolling walker with 5" wheels    Recommendations for Other Services       Precautions / Restrictions Precautions Precautions: Fall Restrictions Weight Bearing Restrictions: No      Mobility  Bed Mobility Overal bed mobility: Needs Assistance Bed Mobility: Supine to Sit     Supine to sit: Min assist;HOB elevated     General bed mobility comments: MIN A for trunk  Transfers Overall transfer level: Needs assistance Equipment used: Rolling walker (2 wheeled) Transfers: Sit to/from Stand Sit to Stand: Min assist         General transfer comment: Cues for safety.  IN standing, took of pt's underwear which were urine soaked (he was unaware) and cleaned him.  he was able to clean fornt with MIN/guard for balance.  Ambulation/Gait Ambulation/Gait assistance: Min assist;+2 safety/equipment Ambulation Distance (Feet): 40 Feet Assistive device: Rolling walker (2 wheeled) Gait  Pattern/deviations: Decreased step length - right;Decreased step length - left;Shuffle;Leaning posteriorly     General Gait Details: Pt with general unsteadiness with posterior sway at times. Cues to increase step length.  Pt having a "spacy" look on face and c/o some lightheadedness and had him sit down.  BP taken 99/62.  Stairs            Wheelchair Mobility    Modified Rankin (Stroke Patients Only)       Balance Overall balance assessment: Needs assistance   Sitting balance-Leahy Scale: Good       Standing balance-Leahy Scale: Poor Standing balance comment: requires RW for safety                             Pertinent Vitals/Pain Pain Assessment: 0-10 Pain Score: 6  Pain Location: R axilla Pain Descriptors / Indicators: Other (Comment) ("like a pulled muscle") Pain Intervention(s): Monitored during session;Limited activity within patient's tolerance    Home Living Family/patient expects to be discharged to:: Private residence Living Arrangements: Spouse/significant other;Other relatives (grand-daughter and 86 year old great-granddaughter) Available Help at Discharge: Available 24 hours/day Type of Home: Mobile home Home Access: Stairs to enter Entrance Stairs-Rails: Left;Right;Can reach both Entrance Stairs-Number of Steps: 4 Home Layout: One level Home Equipment: Cane - single point      Prior Function Level of Independence: Independent               Hand Dominance   Dominant Hand: Right    Extremity/Trunk Assessment   Upper Extremity Assessment: Generalized weakness  Lower Extremity Assessment: Generalized weakness      Cervical / Trunk Assessment: Kyphotic  Communication      Cognition Arousal/Alertness: Awake/alert Behavior During Therapy: WFL for tasks assessed/performed Overall Cognitive Status: Within Functional Limits for tasks assessed                      General Comments General comments (skin  integrity, edema, etc.): Son in at end of session and reports that there is a lot of family involvement and they can provide 24 hour A.    Exercises        Assessment/Plan    PT Assessment Patient needs continued PT services  PT Diagnosis Difficulty walking;Generalized weakness   PT Problem List Decreased strength;Decreased activity tolerance;Decreased mobility;Decreased knowledge of precautions;Cardiopulmonary status limiting activity  PT Treatment Interventions Gait training;Stair training;Functional mobility training;Balance training;Therapeutic exercise;Therapeutic activities;DME instruction   PT Goals (Current goals can be found in the Care Plan section) Acute Rehab PT Goals Patient Stated Goal: Return to PLOF PT Goal Formulation: With patient Time For Goal Achievement: 06/23/15 Potential to Achieve Goals: Good    Frequency Min 3X/week   Barriers to discharge        Co-evaluation               End of Session Equipment Utilized During Treatment: Gait belt Activity Tolerance: Patient limited by fatigue Patient left: in chair;with chair alarm set;with call bell/phone within reach;with family/visitor present Nurse Communication: Mobility status;Other (comment) (BP)         Time: 9562-1308 PT Time Calculation (min) (ACUTE ONLY): 33 min   Charges:   PT Evaluation $Initial PT Evaluation Tier I: 1 Procedure PT Treatments $Gait Training: 8-22 mins   PT G Codes:        Jacob White 06/09/2015, 11:10 AM

## 2015-06-09 NOTE — Progress Notes (Signed)
CRITICAL VALUE ALERT  Critical value received:  Gram + cocci in second tube set  Date of notification:  6/25  Time of notification:  1156  Critical value read back:Yes.    Nurse who received alert:  Greta Doom  MD notified (1st page):  MD Benjamine Mola  Time of first page:  1156-sent text page via amion  MD notified (2nd page):   Time of second page:  Responding MD:  MD Benjamine Mola  Time MD responded:  [phone]

## 2015-06-09 NOTE — Progress Notes (Addendum)
ANTIBIOTIC CONSULT NOTE - INITIAL  Pharmacy Consult for vancomycin Indication: bacteremia  No Known Allergies  Patient Measurements: Height: 5\' 6"  (167.6 cm) Weight: 134 lb 8 oz (61.009 kg) IBW/kg (Calculated) : 63.8   Vital Signs: Temp: 98 F (36.7 C) (06/25 0440) Temp Source: Oral (06/25 0440) BP: 99/62 mmHg (06/25 1030) Pulse Rate: 95 (06/25 0440) Intake/Output from previous day: 06/24 0701 - 06/25 0700 In: 1592.9 [P.O.:720; I.V.:872.9] Out: 675 [Urine:675] Intake/Output from this shift: Total I/O In: 200 [P.O.:200] Out: -   Labs:  Recent Labs  06/08/15 1718 06/09/15 0640  WBC 9.6  --   HGB 13.7  --   PLT 184  --   CREATININE 1.31* 1.10   Estimated Creatinine Clearance: 53.9 mL/min (by C-G formula based on Cr of 1.1). No results for input(s): VANCOTROUGH, VANCOPEAK, VANCORANDOM, GENTTROUGH, GENTPEAK, GENTRANDOM, TOBRATROUGH, TOBRAPEAK, TOBRARND, AMIKACINPEAK, AMIKACINTROU, AMIKACIN in the last 72 hours.   Microbiology: Recent Results (from the past 720 hour(s))  Blood culture (routine x 2)     Status: None (Preliminary result)   Collection Time: 06/08/15  8:10 PM  Result Value Ref Range Status   Specimen Description BLOOD RIGHT HAND  Final   Special Requests BOTTLES DRAWN AEROBIC AND ANAEROBIC 5CC  Final   Culture  Setup Time   Final    GRAM POSITIVE COCCI IN PAIRS ANAEROBIC BOTTLE ONLY CRITICAL RESULT CALLED TO, READ BACK BY AND VERIFIED WITH: AROYAL,RN AT 1053 06/09/15 BY LBENFIELD    Culture GRAM POSITIVE COCCI  Final   Report Status PENDING  Incomplete  Blood culture (routine x 2)     Status: None (Preliminary result)   Collection Time: 06/08/15  8:15 PM  Result Value Ref Range Status   Specimen Description BLOOD LEFT HAND  Final   Special Requests BOTTLES DRAWN AEROBIC AND ANAEROBIC 5CC  Final   Culture  Setup Time   Final    GRAM POSITIVE COCCI IN CLUSTERS ANAEROBIC BOTTLE ONLY CRITICAL RESULT CALLED TO, READ BACK BY AND VERIFIED WITH: AROYAL,RN  AT 1153 06/09/15 BY LBENFIELD    Culture PENDING  Incomplete   Report Status PENDING  Incomplete    Medical History: Past Medical History  Diagnosis Date  . Diabetes mellitus without complication   . Hypertension   . Hyperlipemia     Medications:  Prescriptions prior to admission  Medication Sig Dispense Refill Last Dose  . lisinopril-hydrochlorothiazide (PRINZIDE,ZESTORETIC) 20-25 MG per tablet Take 1 tablet by mouth daily.   06/07/2015 at Unknown time  . meloxicam (MOBIC) 15 MG tablet Take 15 mg by mouth daily as needed for pain.    06/07/2015 at Unknown time  . metFORMIN (GLUCOPHAGE) 1000 MG tablet Take 1,000 mg by mouth 2 (two) times daily with a meal.   06/07/2015 at Unknown time  . Omega-3 Fatty Acids (OMEGA 3 PO) Take 1 tablet by mouth daily.   06/07/2015 at Unknown time  . oxyCODONE-acetaminophen (PERCOCET/ROXICET) 5-325 MG per tablet Take 1 tablet by mouth daily as needed for moderate pain or severe pain.    06/07/2015 at Unknown time  . pravastatin (PRAVACHOL) 40 MG tablet Take 40 mg by mouth daily.   06/07/2015 at Unknown time   Assessment: 69 yo man admitted with weakness and confusion.  He has gpc in 2/2 BC and vancomycin will be started.  Goal of Therapy:  Vancomycin trough level 15-20 mcg/ml  Plan:  Vancomycin 1 gm IV X 1 now then 500 mg IV q12 hours F/u renal function, cultures  and clinical course  Thanks for allowing pharmacy to be a part of this patient's care.  Talbert Cage, PharmD Clinical Pharmacist, [phone]  06/09/2015,12:08 PM

## 2015-06-09 NOTE — Progress Notes (Signed)
Initial Nutrition Assessment  DOCUMENTATION CODES: Not applicable  INTERVENTION: Ensure Enlive BID (each supplement provides 350kcal and 20 grams of protein)  Recommend MVI-reports not taking one at home.  NUTRITION DIAGNOSIS: Increased nutrient needs related to acute illness (uti?) as evidenced by reported weight loss  GOAL: Patient will meet greater than or equal to 90% of their needs  MONITOR: Supplement acceptance, PO intake, Labs, I & O's  REASON FOR ASSESSMENT: Malnutrition Screening Tool    ASSESSMENT: 69 y.o. male PMHx: DM2, HTN, HLD. Presents to the hospital after seeing his PCP earlier today this morning due to right upper extremity pain, weakness, confusion. Believed to have UTI.  Spoke with pt. Per notes, wife said he is extremely confused. Hard to determine accuracy of what he said. nursing note stated A&O x4. He presented well when I talked to him.   Pt states he has no appetite, but still has been eating well (3 meals a day)? Still he has lost weight and he does not know why. He did not take any vitamin or minerals at home. He had constipation but has since resolved  He says his normal weight is 150-152 and he has lost 15 lbs this last month-No wt history documented to confirm this. He did look to have some mild muscle loss, but unsure if this is recent or just how he presents normally.   Pt reports he ate well this morning and is "stuffed right now". Declined snacks/supps.  Height: Ht Readings from Last 1 Encounters:  06/08/15 5\' 6"  (1.676 m)    Weight: Wt Readings from Last 1 Encounters:  06/09/15 134 lb 8 oz (61.009 kg)    Ideal Body Weight:  64.54 kg  Wt Readings from Last 10 Encounters:  06/09/15 134 lb 8 oz (61.009 kg)    BMI:  Body mass index is 21.72 kg/(m^2).  Estimated Nutritional Needs: Kcal:  1600-1800 (26-29 kcal/kg) Protein:  67-79 g (1.1-1.3 g/kg) Fluid:  1.6-1.8 liters  Skin:  Reviewed, no issues  Diet Order:  Diet Carb  Modified Fluid consistency:: Thin; Room service appropriate?: Yes  EDUCATION NEEDS: No education needs identified at this time   Intake/Output Summary (Last 24 hours) at 06/09/15 1200 Last data filed at 06/09/15 0932  Gross per 24 hour  Intake 1792.92 ml  Output    675 ml  Net 1117.92 ml    Last BM:  6/19- constipation  Christophe Louis RD, LDN Nutrition Pager: [phone] 06/09/2015 12:07 PM

## 2015-06-09 NOTE — Progress Notes (Signed)
Dr. Adrian Blackwater gave telephone order with readback to give a 500 cc bolus of normal saline. Orders placed.

## 2015-06-09 NOTE — Progress Notes (Signed)
Pt arrived to unit alert and oriented x4. Oriented to room, unit, and staff.  Bed in lowest position and call bell is within reach. Will continue to monitor. 

## 2015-06-09 NOTE — Progress Notes (Signed)
PROGRESS NOTE  Jacob White:[id number] DOB: [birth year] DOA: 06/08/2015 PCP: No primary care provider on file.  Assessment/Plan: 2/2 gram + cocci bacteremia -vanc -await culture results -? Source -echo  HTN -continue home meds  Generalized weakness -PT - DME walker -repeat x ray now that patient has had VF  AKI due to Dehydration -IVF   Code Status: full Family Communication: patient/family at bedside Disposition Plan:    Consultants:    Procedures:     HPI/Subjective: Good urine output  Objective: Filed Vitals:   06/09/15 1030  BP: 99/62  Pulse:   Temp:   Resp:     Intake/Output Summary (Last 24 hours) at 06/09/15 1200 Last data filed at 06/09/15 0932  Gross per 24 hour  Intake 1792.92 ml  Output    675 ml  Net 1117.92 ml   Filed Weights   06/08/15 1558 06/08/15 2038 06/09/15 0440  Weight: 61.236 kg (135 lb) 60.555 kg (133 lb 8 oz) 61.009 kg (134 lb 8 oz)    Exam:   General:  A+Ox3, NAD  Cardiovascular: rrr  Respiratory: rhonchi on right lung  Abdomen: +BS, soft  Musculoskeletal: no edema   Data Reviewed: Basic Metabolic Panel:  Recent Labs Lab 06/08/15 1718 06/09/15 0640  NA 130* 131*  K 3.7 3.5  CL 91* 98*  CO2 26 21*  GLUCOSE 240* 173*  BUN 40* 29*  CREATININE 1.31* 1.10  CALCIUM 8.8* 8.0*   Liver Function Tests:  Recent Labs Lab 06/08/15 1718  AST 21  ALT 19  ALKPHOS 107  BILITOT 0.8  PROT 7.4  ALBUMIN 2.8*   No results for input(s): LIPASE, AMYLASE in the last 168 hours. No results for input(s): AMMONIA in the last 168 hours. CBC:  Recent Labs Lab 06/08/15 1718  WBC 9.6  NEUTROABS 8.3*  HGB 13.7  HCT 39.3  MCV 84.9  PLT 184   Cardiac Enzymes: No results for input(s): CKTOTAL, CKMB, CKMBINDEX, TROPONINI in the last 168 hours. BNP (last 3 results) No results for input(s): BNP in the last 8760 hours.  ProBNP (last 3 results) No results for input(s): PROBNP in the last 8760  hours.  CBG:  Recent Labs Lab 06/08/15 2205 06/09/15 0748  GLUCAP 223* 190*    Recent Results (from the past 240 hour(s))  Blood culture (routine x 2)     Status: None (Preliminary result)   Collection Time: 06/08/15  8:10 PM  Result Value Ref Range Status   Specimen Description BLOOD RIGHT HAND  Final   Special Requests BOTTLES DRAWN AEROBIC AND ANAEROBIC 5CC  Final   Culture  Setup Time   Final    GRAM POSITIVE COCCI IN PAIRS ANAEROBIC BOTTLE ONLY CRITICAL RESULT CALLED TO, READ BACK BY AND VERIFIED WITH: AROYAL,RN AT 1053 06/09/15 BY LBENFIELD    Culture GRAM POSITIVE COCCI  Final   Report Status PENDING  Incomplete  Blood culture (routine x 2)     Status: None (Preliminary result)   Collection Time: 06/08/15  8:15 PM  Result Value Ref Range Status   Specimen Description BLOOD LEFT HAND  Final   Special Requests BOTTLES DRAWN AEROBIC AND ANAEROBIC 5CC  Final   Culture  Setup Time   Final    GRAM POSITIVE COCCI IN CLUSTERS ANAEROBIC BOTTLE ONLY CRITICAL RESULT CALLED TO, READ BACK BY AND VERIFIED WITH: AROYAL,RN AT 1153 06/09/15 BY LBENFIELD    Culture PENDING  Incomplete   Report Status PENDING  Incomplete  Studies: Dg Chest 2 View  06/08/2015   CLINICAL DATA:  Patient was moving picnic table. Felt sharp pain on the right shoulder. Pain under the breast on the anterior sign. Shortness of breath. History of hypertension, diabetes, smoking.  EXAM: CHEST  2 VIEW  COMPARISON:  None.  FINDINGS: The heart size and mediastinal contours are within normal limits. Both lungs are clear. The visualized skeletal structures are unremarkable.  IMPRESSION: No active cardiopulmonary disease.   Electronically Signed   By: Norva Pavlov M.D.   On: 06/08/2015 17:02   Dg Ribs Unilateral Right  06/08/2015   CLINICAL DATA:  Right chest pain while moving picnic table, initial encounter  EXAM: RIGHT RIBS - 2 VIEW  COMPARISON:  None.  FINDINGS: No fracture or other bone lesions are seen  involving the ribs.  IMPRESSION: No acute abnormality noted.   Electronically Signed   By: Alcide Clever M.D.   On: 06/08/2015 17:02   Dg Shoulder Right  06/08/2015   CLINICAL DATA:  69 year old male with acute onset right shoulder pain with heavy lifting. Initial encounter.  EXAM: RIGHT SHOULDER - 2+ VIEW  COMPARISON:  [facility] Portable chest radiograph 01/03/2013  FINDINGS: Two views of the right shoulder. No glenohumeral joint dislocation. Proximal right humerus intact. Stable smooth exostosis at the midshaft which might be sequelae of remote trauma or related to muscle insertion. No right clavicle or scapula fracture. Visible right ribs and lung parenchyma within normal limits.  IMPRESSION: No acute osseous abnormality identified at the right shoulder.   Electronically Signed   By: Odessa Fleming M.D.   On: 06/08/2015 17:02   Ct Head Wo Contrast  06/08/2015   CLINICAL DATA:  Confusion.  EXAM: CT HEAD WITHOUT CONTRAST  TECHNIQUE: Contiguous axial images were obtained from the base of the skull through the vertex without intravenous contrast.  COMPARISON:  None.  FINDINGS: No acute intracranial abnormality. Specifically, no hemorrhage, hydrocephalus, mass lesion, acute infarction, or significant intracranial injury. No acute calvarial abnormality. Mild volume loss, compatible with patient's age. Visualized paranasal sinuses and mastoids clear. Orbital soft tissues unremarkable.  IMPRESSION: No acute intracranial abnormality.   Electronically Signed   By: Charlett Nose M.D.   On: 06/08/2015 17:10    Scheduled Meds: . enoxaparin (LOVENOX) injection  40 mg Subcutaneous Q24H  . feeding supplement (ENSURE ENLIVE)  237 mL Oral BID BM  . fluconazole  200 mg Oral Daily  . lisinopril  20 mg Oral Daily   And  . hydrochlorothiazide  25 mg Oral Daily  . insulin aspart  0-15 Units Subcutaneous TID WC  . insulin aspart  0-5 Units Subcutaneous QHS  . metFORMIN  1,000 mg Oral BID WC  .  pravastatin  40 mg Oral QHS  . sodium chloride  3 mL Intravenous Q12H   Continuous Infusions: . sodium chloride 125 mL/hr at 06/09/15 9604   Antibiotics Given (last 72 hours)    None      Active Problems:   Weakness generalized   Confusion   Dehydration   Diabetes mellitus without complication   Pain of right upper extremity    Time spent: 25 min    VANN, JESSICA  [facility] Pager [phone]. If 7PM-7AM, please contact night-coverage at [email or web address], password [facility] 06/09/2015, 12:00 PM  LOS: 1 day

## 2015-06-09 NOTE — Progress Notes (Signed)
Received report from Solana, RN in ED for transfer of pt to [phone].

## 2015-06-10 ENCOUNTER — Inpatient Hospital Stay (HOSPITAL_COMMUNITY): Payer: Medicare Other

## 2015-06-10 DIAGNOSIS — M25511 Pain in right shoulder: Secondary | ICD-10-CM

## 2015-06-10 DIAGNOSIS — R509 Fever, unspecified: Secondary | ICD-10-CM

## 2015-06-10 DIAGNOSIS — B9561 Methicillin susceptible Staphylococcus aureus infection as the cause of diseases classified elsewhere: Secondary | ICD-10-CM

## 2015-06-10 DIAGNOSIS — R0781 Pleurodynia: Secondary | ICD-10-CM

## 2015-06-10 LAB — GLUCOSE, CAPILLARY
GLUCOSE-CAPILLARY: 139 mg/dL — AB (ref 65–99)
Glucose-Capillary: 153 mg/dL — ABNORMAL HIGH (ref 65–99)
Glucose-Capillary: 170 mg/dL — ABNORMAL HIGH (ref 65–99)
Glucose-Capillary: 119 mg/dL — ABNORMAL HIGH (ref 65–99)

## 2015-06-10 MED ORDER — CEFAZOLIN SODIUM 1-5 GM-% IV SOLN
1.0000 g | Freq: Three times a day (TID) | INTRAVENOUS | Status: DC
  Administered 2015-06-10 – 2015-06-11 (×3): 1 g via INTRAVENOUS
  Filled 2015-06-10 (×6): qty 50

## 2015-06-10 NOTE — Progress Notes (Signed)
MRI of brain positive- will get carotid, FLP, HgbA1C and consult neuro Renato Gails updated by phone per family request Marlin Canary

## 2015-06-10 NOTE — Consult Note (Signed)
[facility]     Reason for Consult: Staph aureus bacteremia    Referring Physician: Dr. Benjamine Mola  Active Problems:   Weakness generalized   Confusion   Dehydration   Diabetes mellitus without complication   Pain of right upper extremity   Bacteremia   . enoxaparin (LOVENOX) injection  40 mg Subcutaneous Q24H  . feeding supplement (ENSURE ENLIVE)  237 mL Oral BID BM  . fluconazole  200 mg Oral Daily  . lisinopril  20 mg Oral Daily   And  . hydrochlorothiazide  25 mg Oral Daily  . insulin aspart  0-15 Units Subcutaneous TID WC  . insulin aspart  0-5 Units Subcutaneous QHS  . metFORMIN  1,000 mg Oral BID WC  . pravastatin  40 mg Oral QHS  . sodium chloride  3 mL Intravenous Q12H  . vancomycin  500 mg Intravenous Q12H    Recommendations: Vancomycin Cefazolin pending sensitivities MRI right shoulder, spine as tolerated  Repeat blood cultures tomorrow (I will order) TTE -done, results pending  Assessment: He has SA bacteremia and source could be shoulder, spine.     Antibiotics: vancomycin  HPI: Jacob White is a 69 y.o. male with DM who has had progressive confusion, weakness with shuffling gait and fever.  Had a right shoulder injury 2 weeks ago with lawnmower and told it was c/w rotator cuff injury.  Shoulder pain persisted and also noted right rib pain.  Fever to 101 on admission.  Is otherwise active at home.  Currently he is not confused.     Review of Systems: A comprehensive review of systems was negative.  Past Medical History  Diagnosis Date  . Diabetes mellitus without complication   . Hypertension   . Hyperlipemia     History  Substance Use Topics  . Smoking status: Never Smoker   . Smokeless tobacco: Not on file  . Alcohol Use: No    History reviewed. No pertinent family history. No Known Allergies  OBJECTIVE: Blood pressure 131/76, pulse 94, temperature 99.5 F (37.5 C), temperature source Oral, resp. rate 18,  height  (1.676 m), weight 134 lb 8 oz (61.009 kg), SpO2 90 %. General: awake, alert, nad Skin: no rashes Lungs: CTA B Cor: RRR without m Abdomen: soft, nt, nd Ext: no edema, no Oslernodes  Microbiology: Recent Results (from the past 240 hour(s))  Blood culture (routine x 2)     Status: None (Preliminary result)   Collection Time: 06/08/15  8:10 PM  Result Value Ref Range Status   Specimen Description BLOOD RIGHT HAND  Final   Special Requests BOTTLES DRAWN AEROBIC AND ANAEROBIC 5CC  Final   Culture  Setup Time   Final    GRAM POSITIVE COCCI IN PAIRS IN BOTH AEROBIC AND ANAEROBIC BOTTLES CRITICAL RESULT CALLED TO, READ BACK BY AND VERIFIED WITH: AROYAL,RN AT 1053 06/09/15 BY LBENFIELD    Culture STAPHYLOCOCCUS AUREUS  Final   Report Status PENDING  Incomplete  Blood culture (routine x 2)     Status: None (Preliminary result)   Collection Time: 06/08/15  8:15 PM  Result Value Ref Range Status   Specimen Description BLOOD LEFT HAND  Final   Special Requests BOTTLES DRAWN AEROBIC AND ANAEROBIC 5CC  Final   Culture  Setup Time   Final    GRAM POSITIVE COCCI IN CLUSTERS IN BOTH AEROBIC AND ANAEROBIC BOTTLES CRITICAL RESULT CALLED TO, READ BACK BY AND VERIFIED WITH: AROYAL,RN AT 1153 06/09/15  BY LBENFIELD    Culture STAPHYLOCOCCUS AUREUS  Final   Report Status PENDING  Incomplete    Staci Righter, MD [facility] [email or web address] [phone] pager  [phone] cell 06/10/2015, 12:04 PM

## 2015-06-10 NOTE — Consult Note (Signed)
Referring Physician: Dr Benjamine Mola    Chief Complaint: Confusion and lethargy  HPI: Jacob White is a 69 y.o. male with a history of diabetes mellitus, hypertension, and hyperlipidemia who was admitted to [facility] on 06/08/2015 after seeing his primary care physician for right shoulder pain. The patient injured his shoulder while trying to move a picnic table while sitting on his riding mower. During that visit the patient's wife reported that the patient has been very lethargic recently and has been increasingly confused over the past 3-4 weeks. She had never known the patient to be confused in the past. He is usually very active with little projects around the house but now seems to sleep all of the time.The onset of these symptoms has been gradual but he seems to be getting worse. He has had poor PO intake of both liquids and solids. He apparently has had some difficulty swallowing solid foods. The patient was admitted for further evaluation. 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 The patient denies any history of irregular heartbeats or palpitations. He was not taking aspirin prior to admission. He was not good about seeing his physician on a regular basis and he seldom complained of any health issues. He had a fever at time of admission with foul-smelling urine and blood cultures that came back positive for Staphylococcus aureus. He is currently on IV Ancef, vancomycin, and Diflucan. All of the history was obtained from the patient's wife.   Date last known well: Unable to determine Time last known well: Unable to determine tPA Given: No: Unknown time of onset.  Past Medical History  Diagnosis Date  . Diabetes mellitus without complication   . Hypertension   . Hyperlipemia     Surgical history: No pertinent past surgical history.   Family history:  No pertinent family  history.   Social History:  reports that he has never smoked. He does not have any smokeless tobacco history on file. He reports that he does not drink alcohol. His drug history is not on file.   Allergies: No Known Allergies  Medications:  Scheduled: .  ceFAZolin (ANCEF) IV  1 g Intravenous Q8H  . enoxaparin (LOVENOX) injection  40 mg Subcutaneous Q24H  . feeding supplement (ENSURE ENLIVE)  237 mL Oral BID BM  . fluconazole  200 mg Oral Daily  . lisinopril  20 mg Oral Daily   And  . hydrochlorothiazide  25 mg Oral Daily  . insulin aspart  0-15 Units Subcutaneous TID WC  . insulin aspart  0-5 Units Subcutaneous QHS  . metFORMIN  1,000 mg Oral BID WC  . pravastatin  40 mg Oral QHS  . sodium chloride  3 mL Intravenous Q12H  . vancomycin  500 mg Intravenous Q12H    ROS: History obtained from The patient's wife  General ROS: negative for - chills, night sweats, weight gain or weight loss. Positive for lethargy, fever, and poor PO intake. Psychological ROS: negative for - behavioral disorder, hallucinations, memory difficulties, mood swings or suicidal ideation Ophthalmic ROS: negative for - blurry vision, double vision, eye pain or loss of vision ENT ROS: negative for - epistaxis, nasal discharge, oral lesions, sore throat, tinnitus or vertigo Allergy and Immunology ROS: negative for - hives or itchy/watery eyes Hematological and Lymphatic ROS: negative for - bleeding problems, bruising or swollen lymph nodes Endocrine ROS: negative for - galactorrhea, hair  pattern changes, polydipsia/polyuria or temperature intolerance Respiratory ROS: negative for - cough, hemoptysis, or wheezing. Recent shortness of breath with activity which is new. Cardiovascular ROS: negative for - chest pain, dyspnea on exertion, edema or irregular heartbeat Gastrointestinal ROS: negative for - diarrhea, hematemesis, nausea/vomiting or stool incontinence. Epigastric pain (possible hernia) and difficulty  swallowing solids. Genito-Urinary ROS: negative for - dysuria, hematuria, incontinence or urinary frequency/urgency Musculoskeletal ROS: negative for - joint swelling or muscular weakness. Recent shuffling gait and difficulty with ambulation. Neurological ROS: as noted in HPI Dermatological ROS: negative for rash and skin lesion changes   Physical Examination: Blood pressure 130/71, pulse 120, temperature 99.6 F (37.6 C), temperature source Oral, resp. rate 18, height 5\' 6"  (1.676 m), weight 61.009 kg (134 lb 8 oz), SpO2 79 %.  General - pleasant 69 year old thin male just woke up and is mildly confused. Heart - Regular rate and rhythm - no murmer Lungs - Clear to auscultation Abdomen - Soft - mild epigastric pain to palpation. Extremities - Distal pulses intact - no edema Skin - Warm and dry  Mental Status: Fairly alert, oriented to place and situation but not to date. Difficulty with simple calculations. Unable to remember 3 objects after 30 seconds. Unable to name more than 24 leg animals.  Speech fluent without evidence of aphasia.  Able to follow simple commands. Naming intact. Cranial Nerves: II: Discs not visualized; Visual fields grossly normal, pupils equal, round, reactive to light and accommodation III,IV, VI: ptosis not present, extra-ocular motions intact bilaterally V,VII: smile symmetric, facial light touch sensation normal bilaterally VIII: hearing normal bilaterally IX,X: gag reflex present XI: bilateral shoulder shrug XII: midline tongue extension Motor: Right : Upper extremity   5/5    Left:     Upper extremity   5/5  Lower extremity   5/5     Lower extremity   5/5 Tone and bulk:normal tone throughout; no atrophy noted Sensory: Light touch intact throughout, bilaterally Deep Tendon Reflexes: 1+ and symmetric throughout Plantars: Right: Equivocal   Left: Equivocal Cerebellar: Some difficulty with finger to nose testing on the left, difficulty with rapid  alternating movements and normal heel-to-shin test Gait: Deferred for safety reasons CV: pulses palpable throughout    Laboratory Studies:  Basic Metabolic Panel:  Recent Labs Lab 06/08/15 1718 06/09/15 0640  NA 130* 131*  K 3.7 3.5  CL 91* 98*  CO2 26 21*  GLUCOSE 240* 173*  BUN 40* 29*  CREATININE 1.31* 1.10  CALCIUM 8.8* 8.0*    Liver Function Tests:  Recent Labs Lab 06/08/15 1718  AST 21  ALT 19  ALKPHOS 107  BILITOT 0.8  PROT 7.4  ALBUMIN 2.8*   No results for input(s): LIPASE, AMYLASE in the last 168 hours. No results for input(s): AMMONIA in the last 168 hours.  CBC:  Recent Labs Lab 06/08/15 1718  WBC 9.6  NEUTROABS 8.3*  HGB 13.7  HCT 39.3  MCV 84.9  PLT 184    Cardiac Enzymes: No results for input(s): CKTOTAL, CKMB, CKMBINDEX, TROPONINI in the last 168 hours.  BNP: Invalid input(s): POCBNP  CBG:  Recent Labs Lab 06/09/15 1210 06/09/15 1703 06/09/15 2200 06/10/15 0739 06/10/15 1233  GLUCAP 317* 229* 134* 139* 153*    Microbiology: Results for orders placed or performed during the hospital encounter of 06/08/15  Blood culture (routine x 2)     Status: None (Preliminary result)   Collection Time: 06/08/15  8:10 PM  Result Value Ref Range Status  Specimen Description BLOOD RIGHT HAND  Final   Special Requests BOTTLES DRAWN AEROBIC AND ANAEROBIC 5CC  Final   Culture  Setup Time   Final    GRAM POSITIVE COCCI IN PAIRS IN BOTH AEROBIC AND ANAEROBIC BOTTLES CRITICAL RESULT CALLED TO, READ BACK BY AND VERIFIED WITH: AROYAL,RN AT 1053 06/09/15 BY LBENFIELD    Culture STAPHYLOCOCCUS AUREUS  Final   Report Status PENDING  Incomplete  Blood culture (routine x 2)     Status: None (Preliminary result)   Collection Time: 06/08/15  8:15 PM  Result Value Ref Range Status   Specimen Description BLOOD LEFT HAND  Final   Special Requests BOTTLES DRAWN AEROBIC AND ANAEROBIC 5CC  Final   Culture  Setup Time   Final    GRAM POSITIVE COCCI IN  CLUSTERS IN BOTH AEROBIC AND ANAEROBIC BOTTLES CRITICAL RESULT CALLED TO, READ BACK BY AND VERIFIED WITH: AROYAL,RN AT 1153 06/09/15 BY LBENFIELD    Culture STAPHYLOCOCCUS AUREUS  Final   Report Status PENDING  Incomplete    Coagulation Studies: No results for input(s): LABPROT, INR in the last 72 hours.  Urinalysis:  Recent Labs Lab 06/09/15 0306  COLORURINE YELLOW  LABSPEC 1.021  PHURINE 5.5  GLUCOSEU >1000*  HGBUR MODERATE*  BILIRUBINUR NEGATIVE  KETONESUR 15*  PROTEINUR 100*  UROBILINOGEN 1.0  NITRITE NEGATIVE  LEUKOCYTESUR SMALL*    Lipid Panel: No results found for: CHOL, TRIG, HDL, CHOLHDL, VLDL, LDLCALC  HgbA1C: No results found for: HGBA1C  Urine Drug Screen:  No results found for: LABOPIA, COCAINSCRNUR, LABBENZ, AMPHETMU, THCU, LABBARB  Alcohol Level: No results for input(s): ETH in the last 168 hours.  Other results: EKG: Sinus rhythm rate 99 bpm. Refer to the formal cardiology reading for complete details of the study.  Imaging:   Dg Chest 2 View 06/08/2015    No active cardiopulmonary disease.     Dg Ribs Unilateral Right 06/08/2015    No acute abnormality noted.    Dg Shoulder Right 06/08/2015    No acute osseous abnormality identified at the right shoulder.     Ct Head Wo Contrast 06/08/2015    No acute intracranial abnormality.    Mr Brain Wo Contrast 06/10/2015    1. Punctate, acute to early subacute right frontal and right parietal cortical infarcts.  2. Small, subacute to chronic posterior right frontal cortical/subcortical infarct.  3. Chronic cerebellar infarcts.  4. 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       Dg Chest Port 1 View 06/09/2015    1. Bibasilar atelectasis noted.     Assessment: 69 y.o. male with gradually increasing lethargy and confusion over the past 3-4 weeks. 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  T Minimal deficits on exam. Some of his confusion may be due to infection currently being treated with IV anti-probiotics. Difficulty swallowing may be secondary to esophageal stricture. The patient was also noted to be  dehydrated with mild hyponatremia at time of admission.  Stroke Risk Factors - diabetes mellitus, hyperlipidemia and hypertension  Plan:  HgbA1c, fasting lipid panel - pending  Consider MRA of the brain without contrast.  PT consult, OT consult, Speech consult  Echocardiogram - pending  Carotid dopplers - pending  Prophylactic therapy-Antiplatelet med: Aspirin - dose 81 mg daily  Telemetry monitoring  Frequent neuro checks  NPO until swallowing evaluation by RN stroke.  Patient seen and examined  together with physician assistant and I concur with the assessment and plan.  Wyatt Portela, MD   Delton See PA-C [facility] Pager [phone] 06/10/2015, 3:33 PM

## 2015-06-10 NOTE — Progress Notes (Addendum)
PROGRESS NOTE  Jacob White OZH:[id number] DOB: [birth year] DOA: 06/08/2015 PCP: No primary care provider on file.  Assessment/Plan: 2/2 gram + cocci bacteremia- await species -vanc -? Source -echo -ID consult  HTN -continue home meds  Generalized weakness -PT - DME walker -repeat x ray now that patient has had VF  AKI due to Dehydration -IVF  difficulty ambulating -MRI brain today- family worried about CVA -would get MRI of spine as tolerated   Code Status: full Family Communication: patient/family at bedside Disposition Plan:    Consultants:    Procedures:     HPI/Subjective: No pain in spine  Objective: Filed Vitals:   06/10/15 0623  BP: 131/76  Pulse: 94  Temp: 99.5 F (37.5 C)  Resp: 18    Intake/Output Summary (Last 24 hours) at 06/10/15 1106 Last data filed at 06/10/15 0905  Gross per 24 hour  Intake    940 ml  Output    750 ml  Net    190 ml   Filed Weights   06/08/15 1558 06/08/15 2038 06/09/15 0440  Weight: 61.236 kg (135 lb) 60.555 kg (133 lb 8 oz) 61.009 kg (134 lb 8 oz)    Exam:   General:  A+Ox3, NAD  Cardiovascular: rrr  Respiratory: no wheezing  Abdomen: +BS, soft  Musculoskeletal: no edema   Data Reviewed: Basic Metabolic Panel:  Recent Labs Lab 06/08/15 1718 06/09/15 0640  NA 130* 131*  K 3.7 3.5  CL 91* 98*  CO2 26 21*  GLUCOSE 240* 173*  BUN 40* 29*  CREATININE 1.31* 1.10  CALCIUM 8.8* 8.0*   Liver Function Tests:  Recent Labs Lab 06/08/15 1718  AST 21  ALT 19  ALKPHOS 107  BILITOT 0.8  PROT 7.4  ALBUMIN 2.8*   No results for input(s): LIPASE, AMYLASE in the last 168 hours. No results for input(s): AMMONIA in the last 168 hours. CBC:  Recent Labs Lab 06/08/15 1718  WBC 9.6  NEUTROABS 8.3*  HGB 13.7  HCT 39.3  MCV 84.9  PLT 184   Cardiac Enzymes: No results for input(s): CKTOTAL, CKMB, CKMBINDEX, TROPONINI in the last 168 hours. BNP (last 3 results) No results for input(s):  BNP in the last 8760 hours.  ProBNP (last 3 results) No results for input(s): PROBNP in the last 8760 hours.  CBG:  Recent Labs Lab 06/09/15 0748 06/09/15 1210 06/09/15 1703 06/09/15 2200 06/10/15 0739  GLUCAP 190* 317* 229* 134* 139*    Recent Results (from the past 240 hour(s))  Blood culture (routine x 2)     Status: None (Preliminary result)   Collection Time: 06/08/15  8:10 PM  Result Value Ref Range Status   Specimen Description BLOOD RIGHT HAND  Final   Special Requests BOTTLES DRAWN AEROBIC AND ANAEROBIC 5CC  Final   Culture  Setup Time   Final    GRAM POSITIVE COCCI IN PAIRS IN BOTH AEROBIC AND ANAEROBIC BOTTLES CRITICAL RESULT CALLED TO, READ BACK BY AND VERIFIED WITH: AROYAL,RN AT 1053 06/09/15 BY LBENFIELD    Culture GRAM POSITIVE COCCI  Final   Report Status PENDING  Incomplete  Blood culture (routine x 2)     Status: None (Preliminary result)   Collection Time: 06/08/15  8:15 PM  Result Value Ref Range Status   Specimen Description BLOOD LEFT HAND  Final   Special Requests BOTTLES DRAWN AEROBIC AND ANAEROBIC 5CC  Final   Culture  Setup Time   Final    GRAM POSITIVE COCCI IN  CLUSTERS IN BOTH AEROBIC AND ANAEROBIC BOTTLES CRITICAL RESULT CALLED TO, READ BACK BY AND VERIFIED WITH: AROYAL,RN AT 1153 06/09/15 BY LBENFIELD    Culture GRAM POSITIVE COCCI  Final   Report Status PENDING  Incomplete     Studies: Dg Chest 2 View  06/08/2015   CLINICAL DATA:  Patient was moving picnic table. Felt sharp pain on the right shoulder. Pain under the breast on the anterior sign. Shortness of breath. History of hypertension, diabetes, smoking.  EXAM: CHEST  2 VIEW  COMPARISON:  None.  FINDINGS: The heart size and mediastinal contours are within normal limits. Both lungs are clear. The visualized skeletal structures are unremarkable.  IMPRESSION: No active cardiopulmonary disease.   Electronically Signed   By: Norva Pavlov M.D.   On: 06/08/2015 17:02   Dg Ribs Unilateral  Right  06/08/2015   CLINICAL DATA:  Right chest pain while moving picnic table, initial encounter  EXAM: RIGHT RIBS - 2 VIEW  COMPARISON:  None.  FINDINGS: No fracture or other bone lesions are seen involving the ribs.  IMPRESSION: No acute abnormality noted.   Electronically Signed   By: Alcide Clever M.D.   On: 06/08/2015 17:02   Dg Shoulder Right  06/08/2015   CLINICAL DATA:  69 year old male with acute onset right shoulder pain with heavy lifting. Initial encounter.  EXAM: RIGHT SHOULDER - 2+ VIEW  COMPARISON:  [facility] Portable chest radiograph 01/03/2013  FINDINGS: Two views of the right shoulder. No glenohumeral joint dislocation. Proximal right humerus intact. Stable smooth exostosis at the midshaft which might be sequelae of remote trauma or related to muscle insertion. No right clavicle or scapula fracture. Visible right ribs and lung parenchyma within normal limits.  IMPRESSION: No acute osseous abnormality identified at the right shoulder.   Electronically Signed   By: Odessa Fleming M.D.   On: 06/08/2015 17:02   Ct Head Wo Contrast  06/08/2015   CLINICAL DATA:  Confusion.  EXAM: CT HEAD WITHOUT CONTRAST  TECHNIQUE: Contiguous axial images were obtained from the base of the skull through the vertex without intravenous contrast.  COMPARISON:  None.  FINDINGS: No acute intracranial abnormality. Specifically, no hemorrhage, hydrocephalus, mass lesion, acute infarction, or significant intracranial injury. No acute calvarial abnormality. Mild volume loss, compatible with patient's age. Visualized paranasal sinuses and mastoids clear. Orbital soft tissues unremarkable.  IMPRESSION: No acute intracranial abnormality.   Electronically Signed   By: Charlett Nose M.D.   On: 06/08/2015 17:10   Dg Chest Port 1 View  06/09/2015   CLINICAL DATA:  Weakness.  EXAM: PORTABLE CHEST - 1 VIEW  COMPARISON:  06/08/2015  FINDINGS: Normal heart size. No pleural effusion identified. Lung bases stress  that mild bibasilar atelectasis noted. No airspace consolidation.  IMPRESSION: 1. Bibasilar atelectasis noted.   Electronically Signed   By: Signa Kell M.D.   On: 06/09/2015 13:31    Scheduled Meds: . enoxaparin (LOVENOX) injection  40 mg Subcutaneous Q24H  . feeding supplement (ENSURE ENLIVE)  237 mL Oral BID BM  . fluconazole  200 mg Oral Daily  . lisinopril  20 mg Oral Daily   And  . hydrochlorothiazide  25 mg Oral Daily  . insulin aspart  0-15 Units Subcutaneous TID WC  . insulin aspart  0-5 Units Subcutaneous QHS  . metFORMIN  1,000 mg Oral BID WC  . pravastatin  40 mg Oral QHS  . sodium chloride  3 mL Intravenous Q12H  . vancomycin  500 mg Intravenous Q12H   Continuous Infusions: . sodium chloride 75 mL/hr (06/10/15 0935)   Antibiotics Given (last 72 hours)    Date/Time Action Medication Dose Rate   06/09/15 1301 Given   vancomycin (VANCOCIN) IVPB 1000 mg/200 mL premix 1,000 mg 200 mL/hr   06/10/15 0040 Given   vancomycin (VANCOCIN) 500 mg in sodium chloride 0.9 % 100 mL IVPB 500 mg 100 mL/hr      Active Problems:   Weakness generalized   Confusion   Dehydration   Diabetes mellitus without complication   Pain of right upper extremity   Bacteremia    Time spent: 25 min    Altheria Shadoan  [facility] Pager [phone]. If 7PM-7AM, please contact night-coverage at [email or web address], password [facility] 06/10/2015, 11:06 AM  LOS: 2 days

## 2015-06-10 NOTE — Progress Notes (Signed)
ANTIBIOTIC CONSULT NOTE   Pharmacy Consult for cefazolin Indication: bacteremia  No Known Allergies  Patient Measurements: Height:  (167.6 cm) Weight: 134 lb 8 oz (61.009 kg) IBW/kg (Calculated) : 63.8   Vital Signs: Temp: 99.5 F (37.5 C) (06/26 0623) Temp Source: Oral (06/26 0623) BP: 131/76 mmHg (06/26 0623) Pulse Rate: 94 (06/26 0623) Intake/Output from previous day: 06/25 0701 - 06/26 0700 In: 3109.6 [P.O.:840; I.V.:2169.6; IV Piggyback:100] Out: 750 [Urine:750] Intake/Output from this shift: Total I/O In: 393.8 [P.O.:200; I.V.:193.8] Out: -   Labs:  Recent Labs  06/08/15 1718 06/09/15 0640  WBC 9.6  --   HGB 13.7  --   PLT 184  --   CREATININE 1.31* 1.10   Estimated Creatinine Clearance: 53.9 mL/min (by C-G formula based on Cr of 1.1). No results for input(s): VANCOTROUGH, VANCOPEAK, VANCORANDOM, GENTTROUGH, GENTPEAK, GENTRANDOM, TOBRATROUGH, TOBRAPEAK, TOBRARND, AMIKACINPEAK, AMIKACINTROU, AMIKACIN in the last 72 hours.   Microbiology: Recent Results (from the past 720 hour(s))  Blood culture (routine x 2)     Status: None (Preliminary result)   Collection Time: 06/08/15  8:10 PM  Result Value Ref Range Status   Specimen Description BLOOD RIGHT HAND  Final   Special Requests BOTTLES DRAWN AEROBIC AND ANAEROBIC 5CC  Final   Culture  Setup Time   Final    GRAM POSITIVE COCCI IN PAIRS IN BOTH AEROBIC AND ANAEROBIC BOTTLES CRITICAL RESULT CALLED TO, READ BACK BY AND VERIFIED WITH: AROYAL,RN AT 1053 06/09/15 BY LBENFIELD    Culture STAPHYLOCOCCUS AUREUS  Final   Report Status PENDING  Incomplete  Blood culture (routine x 2)     Status: None (Preliminary result)   Collection Time: 06/08/15  8:15 PM  Result Value Ref Range Status   Specimen Description BLOOD LEFT HAND  Final   Special Requests BOTTLES DRAWN AEROBIC AND ANAEROBIC 5CC  Final   Culture  Setup Time   Final    GRAM POSITIVE COCCI IN CLUSTERS IN BOTH AEROBIC AND ANAEROBIC BOTTLES CRITICAL  RESULT CALLED TO, READ BACK BY AND VERIFIED WITH: AROYAL,RN AT 1153 06/09/15 BY LBENFIELD    Culture STAPHYLOCOCCUS AUREUS  Final   Report Status PENDING  Incomplete    Medical History: Past Medical History  Diagnosis Date  . Diabetes mellitus without complication   . Hypertension   . Hyperlipemia     Medications:  Prescriptions prior to admission  Medication Sig Dispense Refill Last Dose  . lisinopril-hydrochlorothiazide (PRINZIDE,ZESTORETIC) 20-25 MG per tablet Take 1 tablet by mouth daily.   06/07/2015 at Unknown time  . meloxicam (MOBIC) 15 MG tablet Take 15 mg by mouth daily as needed for pain.    06/07/2015 at Unknown time  . metFORMIN (GLUCOPHAGE) 1000 MG tablet Take 1,000 mg by mouth 2 (two) times daily with a meal.   06/07/2015 at Unknown time  . Omega-3 Fatty Acids (OMEGA 3 PO) Take 1 tablet by mouth daily.   06/07/2015 at Unknown time  . oxyCODONE-acetaminophen (PERCOCET/ROXICET) 5-325 MG per tablet Take 1 tablet by mouth daily as needed for moderate pain or severe pain.    06/07/2015 at Unknown time  . pravastatin (PRAVACHOL) 40 MG tablet Take 40 mg by mouth daily.   06/07/2015 at Unknown time   Assessment: 69 yo man admitted with weakness and confusion.  He has gpc in 2/2 BC and vancomycin initiated.  Will add ancef until sensitivities are back.  Goal of Therapy:  Vancomycin trough level 15-20 mcg/ml  Plan:  Vancomycin 500 mg  IV q12 hours Ancef 1 gm IV q8 hours F/u renal function, cultures and clinical course  Thanks for allowing pharmacy to be a part of this patient's care.  Talbert Cage, PharmD Clinical Pharmacist, [phone]  06/10/2015,1:00 PM

## 2015-06-10 NOTE — Progress Notes (Signed)
Pt has temp of 101.0. Given PO tylenol 650 mg. Claiborne Billings, NP notified. Will continue to monitor pt.

## 2015-06-11 ENCOUNTER — Inpatient Hospital Stay (HOSPITAL_COMMUNITY): Payer: Medicare Other

## 2015-06-11 DIAGNOSIS — R7881 Bacteremia: Secondary | ICD-10-CM

## 2015-06-11 DIAGNOSIS — I6330 Cerebral infarction due to thrombosis of unspecified cerebral artery: Principal | ICD-10-CM

## 2015-06-11 DIAGNOSIS — B9562 Methicillin resistant Staphylococcus aureus infection as the cause of diseases classified elsewhere: Secondary | ICD-10-CM

## 2015-06-11 DIAGNOSIS — R531 Weakness: Secondary | ICD-10-CM

## 2015-06-11 DIAGNOSIS — G8190 Hemiplegia, unspecified affecting unspecified side: Secondary | ICD-10-CM

## 2015-06-11 DIAGNOSIS — I639 Cerebral infarction, unspecified: Secondary | ICD-10-CM

## 2015-06-11 LAB — LIPID PANEL
Cholesterol: 79 mg/dL (ref 0–200)
HDL: 6 mg/dL — AB (ref >40)
LDL CALC: 42 mg/dL (ref 0–99)
Total CHOL/HDL Ratio: 13.2 RATIO
Triglycerides: 156 mg/dL — ABNORMAL HIGH (ref <150)
VLDL: 31 mg/dL (ref 0–40)

## 2015-06-11 LAB — GLUCOSE, CAPILLARY
GLUCOSE-CAPILLARY: 162 mg/dL — AB (ref 65–99)
Glucose-Capillary: 136 mg/dL — ABNORMAL HIGH (ref 65–99)
Glucose-Capillary: 129 mg/dL — ABNORMAL HIGH (ref 65–99)
Glucose-Capillary: 124 mg/dL — ABNORMAL HIGH (ref 65–99)

## 2015-06-11 LAB — BASIC METABOLIC PANEL
ANION GAP: 11 (ref 5–15)
BUN: 18 mg/dL (ref 6–20)
CALCIUM: 7.7 mg/dL — AB (ref 8.9–10.3)
CO2: 24 mmol/L (ref 22–32)
CREATININE: 0.99 mg/dL (ref 0.61–1.24)
Chloride: 95 mmol/L — ABNORMAL LOW (ref 101–111)
GFR calc Af Amer: >60 mL/min (ref >60)
GLUCOSE: 141 mg/dL — AB (ref 65–99)
Potassium: 2.8 mmol/L — ABNORMAL LOW (ref 3.5–5.1)
Sodium: 130 mmol/L — ABNORMAL LOW (ref 135–145)

## 2015-06-11 LAB — CULTURE, BLOOD (ROUTINE X 2)

## 2015-06-11 LAB — CBC
HEMATOCRIT: 33.9 % — AB (ref 39.0–52.0)
Hemoglobin: 11.8 g/dL — ABNORMAL LOW (ref 13.0–17.0)
MCH: 29 pg (ref 26.0–34.0)
MCHC: 34.8 g/dL (ref 30.0–36.0)
MCV: 83.3 fL (ref 78.0–100.0)
PLATELETS: 125 10*3/uL — AB (ref 150–400)
RBC: 4.07 MIL/uL — ABNORMAL LOW (ref 4.22–5.81)
RDW: 13.3 % (ref 11.5–15.5)
WBC: 7.1 10*3/uL (ref 4.0–10.5)

## 2015-06-11 LAB — MAGNESIUM: MAGNESIUM: 1.5 mg/dL — AB (ref 1.7–2.4)

## 2015-06-11 MED ORDER — POTASSIUM CHLORIDE 10 MEQ/100ML IV SOLN
10.0000 meq | INTRAVENOUS | Status: AC
Start: 2015-06-11 — End: 2015-06-11
  Administered 2015-06-11 (×4): 10 meq via INTRAVENOUS
  Filled 2015-06-11 (×4): qty 100

## 2015-06-11 MED ORDER — ASPIRIN EC 325 MG PO TBEC
325.0000 mg | DELAYED_RELEASE_TABLET | Freq: Every day | ORAL | Status: DC
  Administered 2015-06-11 – 2015-06-13 (×3): 325 mg via ORAL
  Filled 2015-06-11 (×3): qty 1

## 2015-06-11 MED ORDER — MAGNESIUM SULFATE 2 GM/50ML IV SOLN
2.0000 g | Freq: Once | INTRAVENOUS | Status: AC
  Administered 2015-06-11: 2 g via INTRAVENOUS
  Filled 2015-06-11: qty 50

## 2015-06-11 NOTE — Progress Notes (Signed)
STROKE TEAM PROGRESS NOTE   HISTORY Jacob White is a 69 y.o. male with a history of diabetes mellitus, hypertension, and hyperlipidemia who was admitted to [facility] on 06/08/2015 after seeing his primary care physician for right shoulder pain. The patient injured his shoulder while trying to move a picnic table while sitting on his riding mower. During that visit the patient's wife reported that the patient has been very lethargic recently and has been increasingly confused over the past 3-4 weeks. She had never known the patient to be confused in the past. He is usually very active with little projects around the house but now seems to sleep all of the time.The onset of these symptoms has been gradual but he seems to be getting worse. He has had poor PO intake of both liquids and solids. He apparently has had some difficulty swallowing solid foods. The patient was admitted for further evaluation. 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 The patient denies any history of irregular heartbeats or palpitations. He was not taking aspirin prior to admission. He was not good about seeing his physician on a regular basis and he seldom complained of any health issues. He had a fever at time of admission with foul-smelling urine and blood cultures that came back positive for Staphylococcus aureus. He is currently on IV Ancef, vancomycin, and Diflucan. All of the history was obtained from the patient's wife. Unable to determine last known well. Patient was not administered TPA secondary to unknown time of onset. He was admitted for further evaluation and treatment.   SUBJECTIVE (INTERVAL HISTORY) Patients states he is doing better with improving left sided strength   OBJECTIVE Temp:  [98.2 F (36.8 C)-101 F (38.3 C)] 98.6 F (37 C) (06/27 1548) Pulse Rate:  [91-98] 96 (06/27  1548) Cardiac Rhythm:  [-] Normal sinus rhythm (06/26 2041) Resp:  [18-26] 18 (06/27 1548) BP: (109-143)/(68-85) 123/78 mmHg (06/27 1548) SpO2:  [91 %-93 %] 93 % (06/27 1548)   Recent Labs Lab 06/10/15 1233 06/10/15 1633 06/10/15 2210 06/11/15 0805 06/11/15 1211  GLUCAP 153* 170* 119* 136* 129*    Recent Labs Lab 06/08/15 1718 06/09/15 0640 06/11/15 0629  NA 130* 131* 130*  K 3.7 3.5 2.8*  CL 91* 98* 95*  CO2 26 21* 24  GLUCOSE 240* 173* 141*  BUN 40* 29* 18  CREATININE 1.31* 1.10 0.99  CALCIUM 8.8* 8.0* 7.7*  MG  --   --  1.5*    Recent Labs Lab 06/08/15 1718  AST 21  ALT 19  ALKPHOS 107  BILITOT 0.8  PROT 7.4  ALBUMIN 2.8*    Recent Labs Lab 06/08/15 1718 06/11/15 0629  WBC 9.6 7.1  NEUTROABS 8.3*  --   HGB 13.7 11.8*  HCT 39.3 33.9*  MCV 84.9 83.3  PLT 184 125*   No results for input(s): CKTOTAL, CKMB, CKMBINDEX, TROPONINI in the last 168 hours. No results for input(s): LABPROT, INR in the last 72 hours.  Recent Labs  06/09/15 0306  COLORURINE YELLOW  LABSPEC 1.021  PHURINE 5.5  GLUCOSEU >1000*  HGBUR MODERATE*  BILIRUBINUR NEGATIVE  KETONESUR 15*  PROTEINUR 100*  UROBILINOGEN 1.0  NITRITE NEGATIVE  LEUKOCYTESUR SMALL*       Component Value Date/Time   CHOL 79 06/11/2015 0629   TRIG 156* 06/11/2015 0629   HDL 6* 06/11/2015 0629   CHOLHDL 13.2 06/11/2015  1610   VLDL 31 06/11/2015 0629   LDLCALC 42 06/11/2015 0629   No results found for: HGBA1C No results found for: LABOPIA, COCAINSCRNUR, LABBENZ, AMPHETMU, THCU, LABBARB  No results for input(s): ETH in the last 168 hours.  Dg Chest 2 View  06/08/2015   CLINICAL DATA:  Patient was moving picnic table. Felt sharp pain on the right shoulder. Pain under the breast on the anterior sign. Shortness of breath. History of hypertension, diabetes, smoking.  EXAM: CHEST  2 VIEW  COMPARISON:  None.  FINDINGS: The heart size and mediastinal contours are within normal limits. Both lungs are  clear. The visualized skeletal structures are unremarkable.  IMPRESSION: No active cardiopulmonary disease.   Electronically Signed   By: Norva Pavlov M.D.   On: 06/08/2015 17:02   Dg Ribs Unilateral Right  06/08/2015   CLINICAL DATA:  Right chest pain while moving picnic table, initial encounter  EXAM: RIGHT RIBS - 2 VIEW  COMPARISON:  None.  FINDINGS: No fracture or other bone lesions are seen involving the ribs.  IMPRESSION: No acute abnormality noted.   Electronically Signed   By: Alcide Clever M.D.   On: 06/08/2015 17:02   Dg Shoulder Right  06/08/2015   CLINICAL DATA:  69 year old male with acute onset right shoulder pain with heavy lifting. Initial encounter.  EXAM: RIGHT SHOULDER - 2+ VIEW  COMPARISON:  [facility] Portable chest radiograph 01/03/2013  FINDINGS: Two views of the right shoulder. No glenohumeral joint dislocation. Proximal right humerus intact. Stable smooth exostosis at the midshaft which might be sequelae of remote trauma or related to muscle insertion. No right clavicle or scapula fracture. Visible right ribs and lung parenchyma within normal limits.  IMPRESSION: No acute osseous abnormality identified at the right shoulder.   Electronically Signed   By: Odessa Fleming M.D.   On: 06/08/2015 17:02   Ct Head Wo Contrast  06/08/2015   CLINICAL DATA:  Confusion.  EXAM: CT HEAD WITHOUT CONTRAST  TECHNIQUE: Contiguous axial images were obtained from the base of the skull through the vertex without intravenous contrast.  COMPARISON:  None.  FINDINGS: No acute intracranial abnormality. Specifically, no hemorrhage, hydrocephalus, mass lesion, acute infarction, or significant intracranial injury. No acute calvarial abnormality. Mild volume loss, compatible with patient's age. Visualized paranasal sinuses and mastoids clear. Orbital soft tissues unremarkable.  IMPRESSION: No acute intracranial abnormality.   Electronically Signed   By: Charlett Nose M.D.   On: 06/08/2015 17:10    Mr Brain Wo Contrast  06/10/2015   CLINICAL DATA:  Progressive confusion, weakness with shift wing gait, and fever.  EXAM: MRI HEAD WITHOUT CONTRAST  TECHNIQUE: Multiplanar, multiecho pulse sequences of the brain and surrounding structures were obtained without intravenous contrast.  COMPARISON:  Head CT 06/08/2015  FINDINGS: There are small foci of abnormal hyperintense diffusion weighted signal measuring 6 mm in right frontal cortex and 4 mm in right parietal cortex with normal to slightly restricted diffusion on the ADC map compatible with acute to early subacute infarcts. There is a small cortical and subcortical infarct in the posterior right frontal lobe which appears subacute to chronic with some T2 shine through on diffusion-weighted imaging. A small amount of chronic blood products are noted associated with this infarct, and there is also a small amount of microhemorrhage more posteriorly in the right frontal lobe, also likely related to prior ischemia.  There is no evidence of mass, midline shift, or extra-axial fluid collection. There is mild  cerebral atrophy. Patchy T2 hyperintensities in the periventricular greater than subcortical cerebral white matter bilaterally and in the pons are nonspecific but compatible with mild chronic small vessel ischemic disease. Small, chronic infarcts are noted in the left cerebellar hemisphere and superior vermis.  Orbits are unremarkable. Paranasal sinuses and left mastoid air cells are clear. There is a small right mastoid effusion. Major intracranial vascular flow voids are preserved.  IMPRESSION: 1. Punctate, acute to early subacute right frontal and right parietal cortical infarcts. 2. Small, subacute to chronic posterior right frontal cortical/subcortical infarct. 3. Chronic cerebellar infarcts. 4. 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   Electronically Signed   By: Sebastian Ache   On: 06/10/2015 14:42   Dg Chest Port 1  View  06/09/2015   CLINICAL DATA:  Weakness.  EXAM: PORTABLE CHEST - 1 VIEW  COMPARISON:  06/08/2015  FINDINGS: Normal heart size. No pleural effusion identified. Lung bases stress that mild bibasilar atelectasis noted. No airspace consolidation.  IMPRESSION: 1. Bibasilar atelectasis noted.   Electronically Signed   By: Signa Kell M.D.   On: 06/09/2015 13:31   Carotid Doppler  There is 1-39% bilateral ICA stenosis. Vertebral artery flow is antegrade.     PHYSICAL EXAM Pleasant elderly Caucasian male not in distress. . Afebrile. Head is nontraumatic. Neck is supple without bruit.    Cardiac exam no murmur or gallop. Lungs are clear to auscultation. Distal pulses are well felt. Neurological Exam : Awake alert oriented x 3 normal speech and language. Mild left lower face asymmetry. Tongue midline. No drift. Mild diminished fine finger movements on left. Orbits right over left upper extremity. Mild left grip weak.. Normal sensation . Normal coordination. ASSESSMENT/PLAN Jacob White is a 69 y.o. male with history of diabetes mellitus, hypertension, and hyperlipidemia presenting with gradual onset of confusion and lethargy. Workup MRI showed stroke in the setting of infection. He did not receive IV t-PA due to unknown time last known well.   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 Small subacute R frontal cortical/subcortical infarct. Old cerebellar infarcts. Infarcts like secondary to chronic small vessel disease. ? Rule out infectious source  Resultant  Very mild left hemiparesis. Left facial.  MRI  Punctate R frontal and parietal cortical infarcts. Small subacute R frontal cortical/subcortical infarct. Old cerebellar infarcts.  Carotid Doppler  No significant stenosis   2D Echo  pending   LDL 42  HgbA1c pending  Lovenox 40 mg sq daily for VTE prophylaxis  DIET SOFT Room service appropriate?: Yes; Fluid consistency:: Thin  no antithrombotic prior to admission,  now on no antithrombotic. We will go ahead and add aspirin 325 mg daily  Ongoing aggressive stroke risk factor management  Therapy recommendations:  Inpatient rehabilitation. Consult in place.  Disposition:  pending   Hypertension  Home meds:   Lisinopril/hydrochlorothiazide  Stable  Hyperlipidemia  Home meds:  pravachol 40, resumed in hospital. Also omega-3  LDL 42, goal < 70  Continue statin at discharge  Diabetes  HgbA1c pending , goal < 7.0  Other Stroke Risk Factors  Advanced age  Other Active Problems  MRSA bacteremia. On  Antithrombotic  Reason right shoulder injury  Generalized weakness  Acute kidney injury due to dehydration  Hypokalemia  Hospital day # 3  Rhoderick Moody [facility] See Amion for Pager information 06/11/2015 4:43 PM  I have personally examined this patient, reviewed notes, independently viewed imaging studies, participated in medical decision  making and plan of care. I have made any additions or clarifications directly to the above note. Agree with note above.He presented with left hemiparesis in setting of dehydration and sepsis and is at risk for recurrent stroke, TIA,neurological worsening and needs ongoing stroke evaluation and aggressive risk stratification.Continue aspirin,. D/W Dr Izola Price Delia Heady, MD Medical Director [facility] Pager: [phone] 06/11/2015 5:14 PM    To contact Stroke Continuity provider, please refer to [email or web address]. After hours, contact General Neurology

## 2015-06-11 NOTE — Progress Notes (Signed)
[facility] PHYSICAL MEDICINE AND REHABILITATION  CONSULT SERVICE NOTE  Chart reviewed. Pt admitted for small right fontal-parietal infarct on 06/08/15. Was min assist with PT on initial evaluation 06/10/15. Don't anticipate inpatient rehab needs unless there is further neurological deterioration. Will follow along for functional and medical progress for now and proceed with a formal consult if indicated.   Ranelle OysterZachary T. Brodrick Curran, MD, [facility] CenterFAAPMR [facility] 06/11/2015

## 2015-06-11 NOTE — Progress Notes (Signed)
PROGRESS NOTE  Jacob White ZOX:[id number] DOB: 12/12/1946 DOA: 06/08/2015 PCP: No primary care provider on file.  Assessment/Plan: 2/2 MRSA bacteremia -vanc/ancef -? Source -echo -ID consult  CVA -MRI + -carotids ok -echo pending -neuro consult -FLP: LDL 42 -HgbA1C  DM -SSI  Recent shoulder injury -MRI right shoulder  HTN -continue home meds  Generalized weakness -PT - DME walker -MRI lumbar spine  AKI due to Dehydration -IVF  Hypokalemia -replete -check Mg   Code Status: full Family Communication: patient/family at bedside Disposition Plan:    Consultants:    Procedures:     HPI/Subjective: Appetite poor   Objective: Filed Vitals:   06/11/15 1000  BP: 123/84  Pulse: 98  Temp: 98.7 F (37.1 C)  Resp: 20    Intake/Output Summary (Last 24 hours) at 06/11/15 1101 Last data filed at 06/11/15 0916  Gross per 24 hour  Intake   1390 ml  Output      0 ml  Net   1390 ml   Filed Weights   06/08/15 1558 06/08/15 2038 06/09/15 0440  Weight: 61.236 kg (135 lb) 60.555 kg (133 lb 8 oz) 61.009 kg (134 lb 8 oz)    Exam:   General:  A+Ox3, NAD  Cardiovascular: rrr  Respiratory: no wheezing  Abdomen: +BS, soft  Musculoskeletal: no edema   Data Reviewed: Basic Metabolic Panel:  Recent Labs Lab 06/08/15 1718 06/09/15 0640 06/11/15 0629  NA 130* 131* 130*  K 3.7 3.5 2.8*  CL 91* 98* 95*  CO2 26 21* 24  GLUCOSE 240* 173* 141*  BUN 40* 29* 18  CREATININE 1.31* 1.10 0.99  CALCIUM 8.8* 8.0* 7.7*   Liver Function Tests:  Recent Labs Lab 06/08/15 1718  AST 21  ALT 19  ALKPHOS 107  BILITOT 0.8  PROT 7.4  ALBUMIN 2.8*   No results for input(s): LIPASE, AMYLASE in the last 168 hours. No results for input(s): AMMONIA in the last 168 hours. CBC:  Recent Labs Lab 06/08/15 1718 06/11/15 0629  WBC 9.6 7.1  NEUTROABS 8.3*  --   HGB 13.7 11.8*  HCT 39.3 33.9*  MCV 84.9 83.3  PLT 184 125*   Cardiac Enzymes: No  results for input(s): CKTOTAL, CKMB, CKMBINDEX, TROPONINI in the last 168 hours. BNP (last 3 results) No results for input(s): BNP in the last 8760 hours.  ProBNP (last 3 results) No results for input(s): PROBNP in the last 8760 hours.  CBG:  Recent Labs Lab 06/10/15 0739 06/10/15 1233 06/10/15 1633 06/10/15 2210 06/11/15 0805  GLUCAP 139* 153* 170* 119* 136*    Recent Results (from the past 240 hour(s))  Blood culture (routine x 2)     Status: None   Collection Time: 06/08/15  8:10 PM  Result Value Ref Range Status   Specimen Description BLOOD RIGHT HAND  Final   Special Requests BOTTLES DRAWN AEROBIC AND ANAEROBIC 5CC  Final   Culture  Setup Time   Final    GRAM POSITIVE COCCI IN PAIRS IN BOTH AEROBIC AND ANAEROBIC BOTTLES CRITICAL RESULT CALLED TO, READ BACK BY AND VERIFIED WITH: AROYAL,RN AT 1053 06/09/15 BY LBENFIELD    Culture METHICILLIN RESISTANT STAPHYLOCOCCUS AUREUS  Final   Report Status 06/11/2015 FINAL  Final   Organism ID, Bacteria METHICILLIN RESISTANT STAPHYLOCOCCUS AUREUS  Final      Susceptibility   Methicillin resistant staphylococcus aureus - MIC*    CIPROFLOXACIN >=8 RESISTANT Resistant     ERYTHROMYCIN <=0.25 SENSITIVE Sensitive  GENTAMICIN <=0.5 SENSITIVE Sensitive     OXACILLIN >=4 RESISTANT Resistant     TETRACYCLINE <=1 SENSITIVE Sensitive     VANCOMYCIN <=0.5 SENSITIVE Sensitive     TRIMETH/SULFA <=10 SENSITIVE Sensitive     CLINDAMYCIN <=0.25 SENSITIVE Sensitive     RIFAMPIN <=0.5 SENSITIVE Sensitive     Inducible Clindamycin NEGATIVE Sensitive     * METHICILLIN RESISTANT STAPHYLOCOCCUS AUREUS  Blood culture (routine x 2)     Status: None   Collection Time: 06/08/15  8:15 PM  Result Value Ref Range Status   Specimen Description BLOOD LEFT HAND  Final   Special Requests BOTTLES DRAWN AEROBIC AND ANAEROBIC 5CC  Final   Culture  Setup Time   Final    GRAM POSITIVE COCCI IN CLUSTERS IN BOTH AEROBIC AND ANAEROBIC BOTTLES CRITICAL RESULT  CALLED TO, READ BACK BY AND VERIFIED WITH: AROYAL,RN AT 1153 06/09/15 BY LBENFIELD    Culture   Final    METHICILLIN RESISTANT STAPHYLOCOCCUS AUREUS SUSCEPTIBILITIES PERFORMED ON PREVIOUS CULTURE WITHIN THE LAST 5 DAYS.    Report Status 06/11/2015 FINAL  Final     Studies: Mr Brain Wo Contrast  06/10/2015   CLINICAL DATA:  Progressive confusion, weakness with shift wing gait, and fever.  EXAM: MRI HEAD WITHOUT CONTRAST  TECHNIQUE: Multiplanar, multiecho pulse sequences of the brain and surrounding structures were obtained without intravenous contrast.  COMPARISON:  Head CT 06/08/2015  FINDINGS: There are small foci of abnormal hyperintense diffusion weighted signal measuring 6 mm in right frontal cortex and 4 mm in right parietal cortex with normal to slightly restricted diffusion on the ADC map compatible with acute to early subacute infarcts. There is a small cortical and subcortical infarct in the posterior right frontal lobe which appears subacute to chronic with some T2 shine through on diffusion-weighted imaging. A small amount of chronic blood products are noted associated with this infarct, and there is also a small amount of microhemorrhage more posteriorly in the right frontal lobe, also likely related to prior ischemia.  There is no evidence of mass, midline shift, or extra-axial fluid collection. There is mild cerebral atrophy. Patchy T2 hyperintensities in the periventricular greater than subcortical cerebral white matter bilaterally and in the pons are nonspecific but compatible with mild chronic small vessel ischemic disease. Small, chronic infarcts are noted in the left cerebellar hemisphere and superior vermis.  Orbits are unremarkable. Paranasal sinuses and left mastoid air cells are clear. There is a small right mastoid effusion. Major intracranial vascular flow voids are preserved.  IMPRESSION: 1. Punctate, acute to early subacute right frontal and right parietal cortical infarcts. 2.  Small, subacute to chronic posterior right frontal cortical/subcortical infarct. 3. Chronic cerebellar infarcts. 4. 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   Electronically Signed   By: Sebastian Ache   On: 06/10/2015 14:42   Dg Chest Port 1 View  06/09/2015   CLINICAL DATA:  Weakness.  EXAM: PORTABLE CHEST - 1 VIEW  COMPARISON:  06/08/2015  FINDINGS: Normal heart size. No pleural effusion identified. Lung bases stress that mild bibasilar atelectasis noted. No airspace consolidation.  IMPRESSION: 1. Bibasilar atelectasis noted.   Electronically Signed   By: Signa Kell M.D.   On: 06/09/2015 13:31    Scheduled Meds: .  ceFAZolin (ANCEF) IV  1 g Intravenous Q8H  . enoxaparin (LOVENOX) injection  40 mg Subcutaneous Q24H  . feeding supplement (ENSURE ENLIVE)  237 mL Oral BID BM  . fluconazole  200 mg Oral Daily  . lisinopril  20 mg Oral Daily   And  . hydrochlorothiazide  25 mg Oral Daily  . insulin aspart  0-15 Units Subcutaneous TID WC  . insulin aspart  0-5 Units Subcutaneous QHS  . metFORMIN  1,000 mg Oral BID WC  . potassium chloride  10 mEq Intravenous Q1 Hr x 4  . pravastatin  40 mg Oral QHS  . sodium chloride  3 mL Intravenous Q12H  . vancomycin  500 mg Intravenous Q12H   Continuous Infusions:   Antibiotics Given (last 72 hours)    Date/Time Action Medication Dose Rate   06/09/15 1301 Given   vancomycin (VANCOCIN) IVPB 1000 mg/200 mL premix 1,000 mg 200 mL/hr   06/10/15 0040 Given   vancomycin (VANCOCIN) 500 mg in sodium chloride 0.9 % 100 mL IVPB 500 mg 100 mL/hr   06/10/15 1356 Given   vancomycin (VANCOCIN) 500 mg in sodium chloride 0.9 % 100 mL IVPB 500 mg 100 mL/hr   06/10/15 1415 Given   ceFAZolin (ANCEF) IVPB 1 g/50 mL premix 1 g 100 mL/hr   06/10/15 2234 Given   ceFAZolin (ANCEF) IVPB 1 g/50 mL premix 1 g 100 mL/hr   06/11/15 0015 Given   vancomycin (VANCOCIN) 500 mg in sodium chloride 0.9 % 100 mL IVPB 500 mg 100 mL/hr   06/11/15  0521 Given   ceFAZolin (ANCEF) IVPB 1 g/50 mL premix 1 g 100 mL/hr      Active Problems:   Weakness generalized   Confusion   Dehydration   Diabetes mellitus without complication   Pain of right upper extremity   Bacteremia   Cerebral thrombosis with cerebral infarction    Time spent: 25 min    VANN, JESSICA  [facility] Pager [phone]. If 7PM-7AM, please contact night-coverage at [email or web address], password [facility] 06/11/2015, 11:01 AM  LOS: 3 days

## 2015-06-11 NOTE — Care Management (Signed)
Important Message  Patient Details  Name: Jacob White MRN: [id number] Date of Birth: 09/15/1946   Medicare Important Message Given:  Yes-second notification given    Kyla BalzarineShealy, Maja Mccaffery Abena 06/11/2015, 4:29 PM

## 2015-06-11 NOTE — Progress Notes (Signed)
Rehab Admissions Coordinator Note:  Patient was screened by Trish MageLogue, Hermila Millis M for appropriateness for an Inpatient Acute Rehab Consult.  At this time, an inpatient rehab consult has been ordered.  Will follow up once consult is completed.  Trish MageLogue, Nashon Erbes M 06/11/2015, 3:29 PM  I can be reached at [phone].

## 2015-06-11 NOTE — Evaluation (Signed)
Clinical/Bedside Swallow Evaluation Patient Details  Name: COLTEN HUTCHINGS MRN: [id number] Date of Birth: [birth year]  Today's Date: 06/11/2015 Time: SLP Start Time (ACUTE ONLY): 1406 SLP Stop Time (ACUTE ONLY): 1419 SLP Time Calculation (min) (ACUTE ONLY): 13 min  Past Medical History:  Past Medical History  Diagnosis Date  . Diabetes mellitus without complication   . Hypertension   . Hyperlipemia    Past Surgical History: History reviewed. No pertinent past surgical history. HPI:  Dereke KARMAN ZIMAN is a 69 y.o. male with a history of diabetes mellitus, hypertension, and hyperlipidemia who was admitted to [facility] on 06/08/2015 after seeing his primary care physician for right shoulder pain. Wife also reported increasing lethargy and confusion. MRI showed acute to subacute infarcts in the right frontal and parietal lobes, as well as older infarcts.   Assessment / Plan / Recommendation Clinical Impression  Pt has good mastication of regular solids, but with reduced posterior propulsion resulting in prolonged bolus formation. With Min cues for use of liquid wash, boluses are cleared well. Recommend to continue with soft solids but with chopped meats. Would favor giving medications whole in puree due to reduced oral transit. SLP to follow for tolerance and possible readiness to advance. MD may wish to consider cognitive-linguistic evaluation given acute CVA.    Aspiration Risk  Mild    Diet Recommendation Dysphagia 3 (Mech soft);Thin   Medication Administration: Whole meds with puree Compensations: Slow rate;Small sips/bites;Check for pocketing;Follow solids with liquid    Other  Recommendations Oral Care Recommendations: Oral care BID   Follow Up Recommendations   tba    Frequency and Duration min 2x/week  1 week   Pertinent Vitals/Pain n/a    SLP Swallow Goals     Swallow Study Prior Functional Status       General Other Pertinent Information: Romelo DONIELLE HECKEL is a 69  y.o. male with a history of diabetes mellitus, hypertension, and hyperlipidemia who was admitted to [facility] on 06/08/2015 after seeing his primary care physician for right shoulder pain. Wife also reported increasing lethargy and confusion. MRI showed acute to subacute infarcts in the right frontal and parietal lobes, as well as older infarcts. Type of Study: Bedside swallow evaluation Previous Swallow Assessment: none in chart Diet Prior to this Study: Dysphagia 3 (soft);Thin liquids Temperature Spikes Noted: Yes (101) Respiratory Status: Room air History of Recent Intubation: No Behavior/Cognition: Alert;Cooperative;Pleasant mood;Requires cueing Self-Feeding Abilities: Able to feed self Patient Positioning: Upright in chair/Tumbleform Baseline Vocal Quality: Normal    Oral/Motor/Sensory Function Overall Oral Motor/Sensory Function: Appears within functional limits for tasks assessed   Ice Chips Ice chips: Not tested   Thin Liquid Thin Liquid: Within functional limits Presentation: Self Fed;Straw    Nectar Thick Nectar Thick Liquid: Not tested   Honey Thick Honey Thick Liquid: Not tested   Puree Puree: Within functional limits Presentation: Self Fed;Spoon   Solid    Solid: Impaired Presentation: Self Fed Oral Phase Impairments: Impaired anterior to posterior transit      Maxcine Ham, M.A. CCC-SLP [phone]  Maxcine Ham 06/11/2015,2:33 PM

## 2015-06-11 NOTE — Progress Notes (Signed)
[facility]  Date of Admission:  06/08/2015  Antibiotics: vancomycin  Subjective: Does not feel well  Objective: Temp:  [98.2 F (36.8 C)-101 F (38.3 C)] 98.7 F (37.1 C) (06/27 1000) Pulse Rate:  [91-98] 98 (06/27 1000) Resp:  [20-26] 20 (06/27 1000) BP: (109-143)/(68-85) 123/84 mmHg (06/27 1000) SpO2:  [91 %-93 %] 92 % (06/27 1000)  General: awake, in bed, nad Skin: no rashes Lungs: CTA B Cor: RRR without m Abdomen: soft, nt, nd Ext: no edema  Lab Results Lab Results  Component Value Date   WBC 7.1 06/11/2015   HGB 11.8* 06/11/2015   HCT 33.9* 06/11/2015   MCV 83.3 06/11/2015   PLT 125* 06/11/2015    Lab Results  Component Value Date   CREATININE 0.99 06/11/2015   BUN 18 06/11/2015   NA 130* 06/11/2015   K 2.8* 06/11/2015   CL 95* 06/11/2015   CO2 24 06/11/2015    Lab Results  Component Value Date   ALT 19 06/08/2015   AST 21 06/08/2015   ALKPHOS 107 06/08/2015   BILITOT 0.8 06/08/2015      Microbiology: Recent Results (from the past 240 hour(s))  Blood culture (routine x 2)     Status: None   Collection Time: 06/08/15  8:10 PM  Result Value Ref Range Status   Specimen Description BLOOD RIGHT HAND  Final   Special Requests BOTTLES DRAWN AEROBIC AND ANAEROBIC 5CC  Final   Culture  Setup Time   Final    GRAM POSITIVE COCCI IN PAIRS IN BOTH AEROBIC AND ANAEROBIC BOTTLES CRITICAL RESULT CALLED TO, READ BACK BY AND VERIFIED WITH: AROYAL,RN AT 1053 06/09/15 BY LBENFIELD    Culture METHICILLIN RESISTANT STAPHYLOCOCCUS AUREUS  Final   Report Status 06/11/2015 FINAL  Final   Organism ID, Bacteria METHICILLIN RESISTANT STAPHYLOCOCCUS AUREUS  Final      Susceptibility   Methicillin resistant staphylococcus aureus - MIC*    CIPROFLOXACIN >=8 RESISTANT Resistant     ERYTHROMYCIN <=0.25 SENSITIVE Sensitive     GENTAMICIN <=0.5 SENSITIVE Sensitive     OXACILLIN >=4 RESISTANT Resistant     TETRACYCLINE <=1 SENSITIVE Sensitive       VANCOMYCIN <=0.5 SENSITIVE Sensitive     TRIMETH/SULFA <=10 SENSITIVE Sensitive     CLINDAMYCIN <=0.25 SENSITIVE Sensitive     RIFAMPIN <=0.5 SENSITIVE Sensitive     Inducible Clindamycin NEGATIVE Sensitive     * METHICILLIN RESISTANT STAPHYLOCOCCUS AUREUS  Blood culture (routine x 2)     Status: None   Collection Time: 06/08/15  8:15 PM  Result Value Ref Range Status   Specimen Description BLOOD LEFT HAND  Final   Special Requests BOTTLES DRAWN AEROBIC AND ANAEROBIC 5CC  Final   Culture  Setup Time   Final    GRAM POSITIVE COCCI IN CLUSTERS IN BOTH AEROBIC AND ANAEROBIC BOTTLES CRITICAL RESULT CALLED TO, READ BACK BY AND VERIFIED WITH: AROYAL,RN AT 1153 06/09/15 BY LBENFIELD    Culture   Final    METHICILLIN RESISTANT STAPHYLOCOCCUS AUREUS SUSCEPTIBILITIES PERFORMED ON PREVIOUS CULTURE WITHIN THE LAST 5 DAYS.    Report Status 06/11/2015 FINAL  Final    Studies/Results: Mr Brain Wo Contrast  06/10/2015   CLINICAL DATA:  Progressive confusion, weakness with shift wing gait, and fever.  EXAM: MRI HEAD WITHOUT CONTRAST  TECHNIQUE: Multiplanar, multiecho pulse sequences of the brain and surrounding structures were obtained without intravenous contrast.  COMPARISON:  Head CT 06/08/2015  FINDINGS: There are  small foci of abnormal hyperintense diffusion weighted signal measuring 6 mm in right frontal cortex and 4 mm in right parietal cortex with normal to slightly restricted diffusion on the ADC map compatible with acute to early subacute infarcts. There is a small cortical and subcortical infarct in the posterior right frontal lobe which appears subacute to chronic with some T2 shine through on diffusion-weighted imaging. A small amount of chronic blood products are noted associated with this infarct, and there is also a small amount of microhemorrhage more posteriorly in the right frontal lobe, also likely related to prior ischemia.  There is no evidence of mass, midline shift, or extra-axial  fluid collection. There is mild cerebral atrophy. Patchy T2 hyperintensities in the periventricular greater than subcortical cerebral white matter bilaterally and in the pons are nonspecific but compatible with mild chronic small vessel ischemic disease. Small, chronic infarcts are noted in the left cerebellar hemisphere and superior vermis.  Orbits are unremarkable. Paranasal sinuses and left mastoid air cells are clear. There is a small right mastoid effusion. Major intracranial vascular flow voids are preserved.  IMPRESSION: 1. Punctate, acute to early subacute right frontal and right parietal cortical infarcts. 2. Small, subacute to chronic posterior right frontal cortical/subcortical infarct. 3. Chronic cerebellar infarcts. 4. 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   Electronically Signed   By: Sebastian Ache   On: 06/10/2015 14:42    Assessment/Plan:  1) MRSA bacteremia - MRI of head with subacute infarcts.  Has shoulder pain.  Ordered MRI shoulder, back.   TTE ordered Would do TEE Repeat cultures done this am  Staci Righter, MD [facility] Infectious [facility] [email or web address] [phone] pager   [phone] cell 06/11/2015, 2:24 PM

## 2015-06-11 NOTE — Progress Notes (Signed)
Physical Therapy Treatment Patient Details Name: Jacob White MRN: [id number] DOB: 01/25/1946 Today's Date: 06/11/2015    History of Present Illness Pt adm with weakness, confusion, and RUE pain.  MRI + for rt frontal and parietal stroke. Pt also with MRSA bacteremia. PMH - DM, HTN    PT Comments    Pt making slow progress. Pt now found to have CVA and MRSA bacteremia. Feel pt could benefit from CIR prior to return home.  Follow Up Recommendations  CIR     Equipment Recommendations  Rolling walker with 5" wheels    Recommendations for Other Services       Precautions / Restrictions Precautions Precautions: Fall Restrictions Weight Bearing Restrictions: No    Mobility  Bed Mobility Overal bed mobility: Needs Assistance Bed Mobility: Supine to Sit     Supine to sit: HOB elevated;Min assist     General bed mobility comments: Assist to bring trunk up. Pt with posterior lean.  Transfers Overall transfer level: Needs assistance Equipment used: Rolling walker (2 wheeled) Transfers: Sit to/from Stand Sit to Stand: Min assist         General transfer comment: Assist to bring hips up and with posterior lean.  Ambulation/Gait Ambulation/Gait assistance: Mod assist;+2 safety/equipment Ambulation Distance (Feet): 40 Feet (x 2) Assistive device: Rolling walker (2 wheeled) Gait Pattern/deviations: Step-through pattern;Decreased step length - right;Decreased step length - left;Leaning posteriorly;Shuffle Gait velocity: decr Gait velocity interpretation: Below normal speed for age/gender General Gait Details: Pt with posterior lean. Verbal cues to look up.   Stairs            Wheelchair Mobility    Modified Rankin (Stroke Patients Only)       Balance Overall balance assessment: Needs assistance Sitting-balance support: No upper extremity supported;Feet supported Sitting balance-Leahy Scale: Poor   Postural control: Posterior lean Standing balance  support: Bilateral upper extremity supported Standing balance-Leahy Scale: Poor Standing balance comment: rolling walker and min A for posterior lean                    Cognition Arousal/Alertness: Awake/alert Behavior During Therapy: WFL for tasks assessed/performed Overall Cognitive Status: Within Functional Limits for tasks assessed                      Exercises      General Comments        Pertinent Vitals/Pain      Home Living                      Prior Function            PT Goals (current goals can now be found in the care plan section) Acute Rehab PT Goals Patient Stated Goal: Return to PLOF PT Goal Formulation: With patient Time For Goal Achievement: 06/23/15 Potential to Achieve Goals: Good Progress towards PT goals: Progressing toward goals    Frequency  Min 3X/week    PT Plan Discharge plan needs to be updated    Co-evaluation             End of Session   Activity Tolerance: Patient limited by fatigue Patient left: in chair;with chair alarm set;with call bell/phone within reach;with family/visitor present     Time: 1341-1358 PT Time Calculation (min) (ACUTE ONLY): 17 min  Charges:  $Gait Training: 8-22 mins  G Codes:      Jacob White 06/11/2015, 3:14 PM  [facility] PT [phone]

## 2015-06-11 NOTE — Consult Note (Signed)
Physical Medicine and Rehabilitation Consult Reason for Consult: weakness Referring Physician: Benjamine Mola   HPI: Jacob White is a 69 y.o. male admitted to [facility] on 06/08/2015 after seeing his primary care physician for right shoulder pain. The patient injured his shoulder while trying to move a picnic table while sitting on his riding mower. During that visit the patient's wife reported that the patient has been very lethargic recently and has been increasingly confused over the past 3-4 weeks. 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 Hospital course complicated by MRSA bacteremia. MRI of the lumbar spine and right shoulder have been ordered to assess source of bacteremia/pain. Pt is currently on IV vancomycin.  Pt has regressed from a functional standpoint since admission when he was min assist with PT. Therefore a formal rehab consult is being completed.    Review of Systems  Constitutional: Negative for chills.  Eyes: Negative for blurred vision.  Respiratory: Negative for cough.   Cardiovascular: Negative for chest pain and palpitations.  Gastrointestinal: Negative for nausea and vomiting.  Genitourinary: Negative for dysuria.  Musculoskeletal: Positive for myalgias, back pain and joint pain.  Skin: Negative for itching.  Neurological: Positive for dizziness and focal weakness. Negative for headaches.  Psychiatric/Behavioral: Negative for depression.   Past Medical History  Diagnosis Date  . Diabetes mellitus without complication   . Hypertension   . Hyperlipemia    History reviewed. No pertinent past surgical history. History reviewed. No pertinent family history. Social History:  reports that he has never smoked. He does not have any smokeless tobacco history on file. He reports that he does not drink alcohol. His drug history is not on file. Allergies: No Known  Allergies Medications Prior to Admission  Medication Sig Dispense Refill  . lisinopril-hydrochlorothiazide (PRINZIDE,ZESTORETIC) 20-25 MG per tablet Take 1 tablet by mouth daily.    . meloxicam (MOBIC) 15 MG tablet Take 15 mg by mouth daily as needed for pain.     . metFORMIN (GLUCOPHAGE) 1000 MG tablet Take 1,000 mg by mouth 2 (two) times daily with a meal.    . Omega-3 Fatty Acids (OMEGA 3 PO) Take 1 tablet by mouth daily.    Marland Kitchen oxyCODONE-acetaminophen (PERCOCET/ROXICET) 5-325 MG per tablet Take 1 tablet by mouth daily as needed for moderate pain or severe pain.     . pravastatin (PRAVACHOL) 40 MG tablet Take 40 mg by mouth daily.      Home: Home Living Family/patient expects to be discharged to:: Private residence Living Arrangements: Spouse/significant other, Other relatives (grand-daughter and 47 year old great-granddaughter) Available Help at Discharge: Available 24 hours/day Type of Home: Mobile home Home Access: Stairs to enter [employer] of Steps: 4 Entrance Stairs-Rails: Left, Right, Can reach both Home Layout: One level Home Equipment: Cane - single point  Functional History: Prior Function Level of Independence: Independent Functional Status:  Mobility: Bed Mobility Overal bed mobility: Needs Assistance Bed Mobility: Supine to Sit Supine to sit: HOB elevated, Min assist General bed mobility comments: Assist to bring trunk up. Pt with posterior lean. Transfers Overall transfer level: Needs assistance Equipment used: Rolling walker (2 wheeled) Transfers: Sit to/from Stand Sit to Stand: Min assist General transfer comment: Assist to bring hips up and with posterior lean. Ambulation/Gait Ambulation/Gait assistance: Mod assist, +2 safety/equipment Ambulation Distance (Feet): 40 Feet (x 2) Assistive device: Rolling walker (2 wheeled) General Gait Details: Pt  with posterior lean. Verbal cues to look up. Gait Pattern/deviations: Step-through pattern, Decreased  step length - right, Decreased step length - left, Leaning posteriorly, Shuffle Gait velocity: decr Gait velocity interpretation: Below normal speed for age/gender    ADL:    Cognition: Cognition Overall Cognitive Status: Within Functional Limits for tasks assessed Orientation Level: Oriented to person, Oriented to time, Oriented to situation Cognition Arousal/Alertness: Awake/alert Behavior During Therapy: WFL for tasks assessed/performed Overall Cognitive Status: Within Functional Limits for tasks assessed  Blood pressure 123/84, pulse 98, temperature 98.7 F (37.1 C), temperature source Oral, resp. rate 20, height  (1.676 m), weight 61.009 kg (134 lb 8 oz), SpO2 92 %. Physical Exam  Constitutional: He appears well-developed.  HENT:  Head: Normocephalic and atraumatic.  Eyes: Pupils are equal, round, and reactive to light.  Neck: Neck supple.  Cardiovascular: Normal rate.   Respiratory: No respiratory distress.  GI: He exhibits no distension.  Neurological: He is alert.  RUE slightly limited due to pain in shoulder otherwise nearly 4 to 4+/5. LUE is 4/5 delt,bicep,tricep, wrist, hand. LE: 3/5 HF bilaterally. 4/5 RKE, 4- LKE, ADF/APF nearly 4/5 both limbs. No sensory changes in either left or right arm/leg. Speech generally clear. Has mild left facial droop. Cognitively seems to display reasonable, basic insight and awareness. Language normal. Conversationally appropriate.    Results for orders placed or performed during the hospital encounter of 06/08/15 (from the past 24 hour(s))  Glucose, capillary     Status: Abnormal   Collection Time: 06/10/15  4:33 PM  Result Value Ref Range   Glucose-Capillary 170 (H) 65 - 99 mg/dL  Glucose, capillary     Status: Abnormal   Collection Time: 06/10/15 10:10 PM  Result Value Ref Range   Glucose-Capillary 119 (H) 65 - 99 mg/dL   Comment 1 Notify RN    Comment 2 Document in Chart   CBC     Status: Abnormal   Collection Time:  06/11/15  6:29 AM  Result Value Ref Range   WBC 7.1 4.0 - 10.5 K/uL   RBC 4.07 (L) 4.22 - 5.81 MIL/uL   Hemoglobin 11.8 (L) 13.0 - 17.0 g/dL   HCT 16.1 (L) 09.6 - 04.5 %   MCV 83.3 78.0 - 100.0 fL   MCH 29.0 26.0 - 34.0 pg   MCHC 34.8 30.0 - 36.0 g/dL   RDW 40.9 81.1 - 91.4 %   Platelets 125 (L) 150 - 400 K/uL  Basic metabolic panel     Status: Abnormal   Collection Time: 06/11/15  6:29 AM  Result Value Ref Range   Sodium 130 (L) 135 - 145 mmol/L   Potassium 2.8 (L) 3.5 - 5.1 mmol/L   Chloride 95 (L) 101 - 111 mmol/L   CO2 24 22 - 32 mmol/L   Glucose, Bld 141 (H) 65 - 99 mg/dL   BUN 18 6 - 20 mg/dL   Creatinine, Ser 7.82 0.61 - 1.24 mg/dL   Calcium 7.7 (L) 8.9 - 10.3 mg/dL   GFR calc non Af Amer >60 >60 mL/min   GFR calc Af Amer >60 >60 mL/min   Anion gap 11 5 - 15  Lipid panel     Status: Abnormal   Collection Time: 06/11/15  6:29 AM  Result Value Ref Range   Cholesterol 79 0 - 200 mg/dL   Triglycerides 956 (H) <150 mg/dL   HDL 6 (L) >21 mg/dL   Total CHOL/HDL Ratio 13.2 RATIO   VLDL 31 0 -  40 mg/dL   LDL Cholesterol 42 0 - 99 mg/dL  Magnesium     Status: Abnormal   Collection Time: 06/11/15  6:29 AM  Result Value Ref Range   Magnesium 1.5 (L) 1.7 - 2.4 mg/dL  Glucose, capillary     Status: Abnormal   Collection Time: 06/11/15  8:05 AM  Result Value Ref Range   Glucose-Capillary 136 (H) 65 - 99 mg/dL  Glucose, capillary     Status: Abnormal   Collection Time: 06/11/15 12:11 PM  Result Value Ref Range   Glucose-Capillary 129 (H) 65 - 99 mg/dL   Mr Brain Wo Contrast  06/10/2015   CLINICAL DATA:  Progressive confusion, weakness with shift wing gait, and fever.  EXAM: MRI HEAD WITHOUT CONTRAST  TECHNIQUE: Multiplanar, multiecho pulse sequences of the brain and surrounding structures were obtained without intravenous contrast.  COMPARISON:  Head CT 06/08/2015  FINDINGS: There are small foci of abnormal hyperintense diffusion weighted signal measuring 6 mm in right frontal  cortex and 4 mm in right parietal cortex with normal to slightly restricted diffusion on the ADC map compatible with acute to early subacute infarcts. There is a small cortical and subcortical infarct in the posterior right frontal lobe which appears subacute to chronic with some T2 shine through on diffusion-weighted imaging. A small amount of chronic blood products are noted associated with this infarct, and there is also a small amount of microhemorrhage more posteriorly in the right frontal lobe, also likely related to prior ischemia.  There is no evidence of mass, midline shift, or extra-axial fluid collection. There is mild cerebral atrophy. Patchy T2 hyperintensities in the periventricular greater than subcortical cerebral white matter bilaterally and in the pons are nonspecific but compatible with mild chronic small vessel ischemic disease. Small, chronic infarcts are noted in the left cerebellar hemisphere and superior vermis.  Orbits are unremarkable. Paranasal sinuses and left mastoid air cells are clear. There is a small right mastoid effusion. Major intracranial vascular flow voids are preserved.  IMPRESSION: 1. Punctate, acute to early subacute right frontal and right parietal cortical infarcts. 2. Small, subacute to chronic posterior right frontal cortical/subcortical infarct. 3. Chronic cerebellar infarcts. 4. 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   Electronically Signed   By: Sebastian Ache   On: 06/10/2015 14:42    Assessment/Plan: Diagnosis: right fontal-parietal infarct complicated by MRSA bacteremia (?source) 1. Does the need for close, 24 hr/day medical supervision in concert with the patient's rehab needs make it unreasonable for this patient to be served in a less intensive setting? Yes and Potentially 2. Co-Morbidities requiring supervision/potential complications: DM, 3. Due to bladder management, bowel management, safety, skin/wound care, disease  management, medication administration, pain management and patient education, does the patient require 24 hr/day rehab nursing? Yes 4. Does the patient require coordinated care of a physician, rehab nurse, PT (1-2 hrs/day, 5 days/week) and OT (1-2 hrs/day, 5 days/week), potentially SLP,  to address physical and functional deficits in the context of the above medical diagnosis(es)? Yes Addressing deficits in the following areas: balance, endurance, locomotion, strength, transferring, bowel/bladder control, bathing, dressing, feeding, grooming, toileting, cognition and psychosocial support 5. Can the patient actively participate in an intensive therapy program of at least 3 hrs of therapy per day at least 5 days per week? Yes 6. The potential for patient to make measurable gains while on inpatient rehab is good 7. Anticipated functional outcomes upon discharge from inpatient rehab are modified independent  with PT, modified independent with OT, modified independent with SLP. 8. Estimated rehab length of stay to reach the above functional goals is: potentially 7-10 days 9. Does the patient have adequate social supports and living environment to accommodate these discharge functional goals? Yes and Potentially 10. Anticipated D/C setting: Home 11. Anticipated post D/C treatments: HH therapy and Outpatient therapy 12. Overall Rehab/Functional Prognosis: excellent  RECOMMENDATIONS: This patient's condition is appropriate for continued rehabilitative care in the following setting: CIR Patient has agreed to participate in recommended program. Yes Note that insurance prior authorization may be required for reimbursement for recommended care.  Comment: Rehab Admissions Coordinator to follow up.  Thanks,  Ranelle OysterZachary T. Zedrick Springsteen, MD, [place] DomFAAPMR     06/11/2015

## 2015-06-11 NOTE — Progress Notes (Signed)
*  PRELIMINARY RESULTS* Vascular Ultrasound Carotid Duplex (Doppler) has been completed.  Preliminary findings: Bilateral:  1-39% ICA stenosis.  Vertebral artery flow is antegrade.      Farrel DemarkJill Eunice, RDMS, RVT  06/11/2015, 10:28 AM

## 2015-06-12 ENCOUNTER — Encounter (HOSPITAL_COMMUNITY): Payer: Self-pay | Admitting: Internal Medicine

## 2015-06-12 ENCOUNTER — Inpatient Hospital Stay (HOSPITAL_COMMUNITY): Payer: Medicare Other

## 2015-06-12 ENCOUNTER — Encounter (HOSPITAL_COMMUNITY)
Admission: EM | Disposition: A | Discharge status: Rehabilitation Facility | Payer: Medicare Other | Source: Home / Self Care | Attending: Internal Medicine

## 2015-06-12 DIAGNOSIS — I639 Cerebral infarction, unspecified: Secondary | ICD-10-CM

## 2015-06-12 DIAGNOSIS — E876 Hypokalemia: Secondary | ICD-10-CM

## 2015-06-12 HISTORY — PX: TEE WITHOUT CARDIOVERSION: SHX5443

## 2015-06-12 LAB — BASIC METABOLIC PANEL
ANION GAP: 9 (ref 5–15)
BUN: 17 mg/dL (ref 6–20)
CO2: 29 mmol/L (ref 22–32)
Calcium: 7.6 mg/dL — ABNORMAL LOW (ref 8.9–10.3)
Chloride: 91 mmol/L — ABNORMAL LOW (ref 101–111)
Creatinine, Ser: 0.96 mg/dL (ref 0.61–1.24)
Glucose, Bld: 150 mg/dL — ABNORMAL HIGH (ref 65–99)
Potassium: 2.9 mmol/L — ABNORMAL LOW (ref 3.5–5.1)
SODIUM: 129 mmol/L — AB (ref 135–145)

## 2015-06-12 LAB — MRSA PCR SCREENING: MRSA by PCR: POSITIVE — AB

## 2015-06-12 LAB — GLUCOSE, CAPILLARY
GLUCOSE-CAPILLARY: 134 mg/dL — AB (ref 65–99)
GLUCOSE-CAPILLARY: 143 mg/dL — AB (ref 65–99)
GLUCOSE-CAPILLARY: 199 mg/dL — AB (ref 65–99)
Glucose-Capillary: 128 mg/dL — ABNORMAL HIGH (ref 65–99)
Glucose-Capillary: 144 mg/dL — ABNORMAL HIGH (ref 65–99)

## 2015-06-12 LAB — HEMOGLOBIN A1C
HEMOGLOBIN A1C: 8.9 % — AB (ref 4.8–5.6)
Mean Plasma Glucose: 209 mg/dL

## 2015-06-12 LAB — CBC
HEMATOCRIT: 37.7 % — AB (ref 39.0–52.0)
HEMOGLOBIN: 13.1 g/dL (ref 13.0–17.0)
MCH: 29.2 pg (ref 26.0–34.0)
MCHC: 34.7 g/dL (ref 30.0–36.0)
MCV: 84.2 fL (ref 78.0–100.0)
Platelets: 145 10*3/uL — ABNORMAL LOW (ref 150–400)
RBC: 4.48 MIL/uL (ref 4.22–5.81)
RDW: 13.4 % (ref 11.5–15.5)
WBC: 9.5 10*3/uL (ref 4.0–10.5)

## 2015-06-12 LAB — URINE CULTURE

## 2015-06-12 LAB — VANCOMYCIN, TROUGH: Vancomycin Tr: 9 ug/mL — ABNORMAL LOW (ref 10.0–20.0)

## 2015-06-12 SURGERY — ECHOCARDIOGRAM, TRANSESOPHAGEAL
Anesthesia: Moderate Sedation

## 2015-06-12 MED ORDER — STROKE: EARLY STAGES OF RECOVERY BOOK
Freq: Once | Status: AC
  Administered 2015-06-12: 1
  Filled 2015-06-12: qty 1

## 2015-06-12 MED ORDER — POTASSIUM CHLORIDE CRYS ER 20 MEQ PO TBCR: 40.0000 meq | EXTENDED_RELEASE_TABLET | ORAL | Status: DC

## 2015-06-12 MED ORDER — MIDAZOLAM HCL 5 MG/ML IJ SOLN
INTRAMUSCULAR | Status: AC
  Filled 2015-06-12: qty 2

## 2015-06-12 MED ORDER — FENTANYL CITRATE (PF) 100 MCG/2ML IJ SOLN
INTRAMUSCULAR | Status: DC | PRN
  Administered 2015-06-12: 25 ug via INTRAVENOUS

## 2015-06-12 MED ORDER — CHLORHEXIDINE GLUCONATE CLOTH 2 % EX PADS
6.0000 | MEDICATED_PAD | Freq: Every day | CUTANEOUS | Status: DC
  Administered 2015-06-13: 6 via TOPICAL

## 2015-06-12 MED ORDER — POTASSIUM CHLORIDE 10 MEQ/100ML IV SOLN
10.0000 meq | INTRAVENOUS | Status: AC
  Administered 2015-06-12 (×2): 10 meq via INTRAVENOUS
  Filled 2015-06-12 (×4): qty 100

## 2015-06-12 MED ORDER — MIDAZOLAM HCL 10 MG/2ML IJ SOLN
INTRAMUSCULAR | Status: DC | PRN
Start: 2015-06-12 — End: 2015-06-12
  Administered 2015-06-12: 1 mg via INTRAVENOUS
  Administered 2015-06-12: 2 mg via INTRAVENOUS
  Administered 2015-06-12: 1 mg via INTRAVENOUS

## 2015-06-12 MED ORDER — MUPIROCIN 2 % EX OINT
1.0000 "application " | TOPICAL_OINTMENT | Freq: Two times a day (BID) | CUTANEOUS | Status: DC
  Administered 2015-06-12 – 2015-06-13 (×3): 1 via NASAL
  Filled 2015-06-12: qty 22

## 2015-06-12 MED ORDER — LIDOCAINE VISCOUS 2 % MT SOLN
OROMUCOSAL | Status: AC
  Filled 2015-06-12: qty 15

## 2015-06-12 MED ORDER — FENTANYL CITRATE (PF) 100 MCG/2ML IJ SOLN
INTRAMUSCULAR | Status: AC
  Filled 2015-06-12: qty 2

## 2015-06-12 MED ORDER — VANCOMYCIN HCL IN DEXTROSE 750-5 MG/150ML-% IV SOLN
750.0000 mg | Freq: Two times a day (BID) | INTRAVENOUS | Status: DC
  Administered 2015-06-12 – 2015-06-13 (×3): 750 mg via INTRAVENOUS
  Filled 2015-06-12 (×4): qty 150

## 2015-06-12 MED ORDER — BUTAMBEN-TETRACAINE-BENZOCAINE 2-2-14 % EX AERO
INHALATION_SPRAY | CUTANEOUS | Status: DC | PRN
Start: 2015-06-12 — End: 2015-06-12
  Administered 2015-06-12: 2 via TOPICAL

## 2015-06-12 MED ORDER — POTASSIUM CHLORIDE CRYS ER 20 MEQ PO TBCR
40.0000 meq | EXTENDED_RELEASE_TABLET | Freq: Once | ORAL | Status: AC
  Administered 2015-06-12: 40 meq via ORAL
  Filled 2015-06-12: qty 2

## 2015-06-12 MED ORDER — INSULIN GLARGINE 100 UNIT/ML ~~LOC~~ SOLN
7.0000 [IU] | Freq: Every day | SUBCUTANEOUS | Status: DC
  Administered 2015-06-12 – 2015-06-13 (×2): 7 [IU] via SUBCUTANEOUS
  Filled 2015-06-12 (×2): qty 0.07

## 2015-06-12 NOTE — Progress Notes (Signed)
PROGRESS NOTE  Jacob White:[id number] DOB: [birth year] DOA: 06/08/2015 PCP: No primary care provider on file.  Assessment/Plan: 2/2 MRSA bacteremia- repeat culture -vanc/ancef -? Source -echo---TEE 6/28 -ID consult  CVA -MRI + -carotids ok -echo pending -neuro consult -FLP: LDL 42 -HgbA1C 8.9  DM -SSI -add lantus  Recent shoulder injury -MRI right shoulder  HTN -continue home meds  Generalized weakness -PT - DME walker -MRI lumbar spine- will need to monitor with follow up imaging  AKI due to Dehydration -IVF  Hypokalemia -replete -check Mg   Code Status: full Family Communication: patient/family at bedside Disposition Plan: CIR 1- 2 days   Consultants:    Procedures:     HPI/Subjective: Appetite poor   Objective: Filed Vitals:   06/12/15 0515  BP: 129/76  Pulse: 94  Temp: 98.6 F (37 C)  Resp: 18    Intake/Output Summary (Last 24 hours) at 06/12/15 1219 Last data filed at 06/12/15 1158  Gross per 24 hour  Intake    150 ml  Output   2550 ml  Net  -2400 ml   Filed Weights   06/08/15 1558 06/08/15 2038 06/09/15 0440  Weight: 61.236 kg (135 lb) 60.555 kg (133 lb 8 oz) 61.009 kg (134 lb 8 oz)    Exam:   General:  A+Ox3, NAD  Cardiovascular: rrr  Respiratory: no wheezing  Abdomen: +BS, soft  Musculoskeletal: no edema   Data Reviewed: Basic Metabolic Panel:  Recent Labs Lab 06/08/15 1718 06/09/15 0640 06/11/15 0629 06/12/15 0642  NA 130* 131* 130* 129*  K 3.7 3.5 2.8* 2.9*  CL 91* 98* 95* 91*  CO2 26 21* 24 29  GLUCOSE 240* 173* 141* 150*  BUN 40* 29* 18 17  CREATININE 1.31* 1.10 0.99 0.96  CALCIUM 8.8* 8.0* 7.7* 7.6*  MG  --   --  1.5*  --    Liver Function Tests:  Recent Labs Lab 06/08/15 1718  AST 21  ALT 19  ALKPHOS 107  BILITOT 0.8  PROT 7.4  ALBUMIN 2.8*   No results for input(s): LIPASE, AMYLASE in the last 168 hours. No results for input(s): AMMONIA in the last 168  hours. CBC:  Recent Labs Lab 06/08/15 1718 06/11/15 0629 06/12/15 0642  WBC 9.6 7.1 9.5  NEUTROABS 8.3*  --   --   HGB 13.7 11.8* 13.1  HCT 39.3 33.9* 37.7*  MCV 84.9 83.3 84.2  PLT 184 125* 145*   Cardiac Enzymes: No results for input(s): CKTOTAL, CKMB, CKMBINDEX, TROPONINI in the last 168 hours. BNP (last 3 results) No results for input(s): BNP in the last 8760 hours.  ProBNP (last 3 results) No results for input(s): PROBNP in the last 8760 hours.  CBG:  Recent Labs Lab 06/11/15 1211 06/11/15 1752 06/11/15 2204 06/12/15 0132 06/12/15 0800  GLUCAP 129* 162* 124* 134* 143*    Recent Results (from the past 240 hour(s))  Blood culture (routine x 2)     Status: None   Collection Time: 06/08/15  8:10 PM  Result Value Ref Range Status   Specimen Description BLOOD RIGHT HAND  Final   Special Requests BOTTLES DRAWN AEROBIC AND ANAEROBIC 5CC  Final   Culture  Setup Time   Final    GRAM POSITIVE COCCI IN PAIRS IN BOTH AEROBIC AND ANAEROBIC BOTTLES CRITICAL RESULT CALLED TO, READ BACK BY AND VERIFIED WITH: AROYAL,RN AT 1053 06/09/15 BY LBENFIELD    Culture METHICILLIN RESISTANT STAPHYLOCOCCUS AUREUS  Final   Report Status 06/11/2015 FINAL  Final   Organism ID, Bacteria METHICILLIN RESISTANT STAPHYLOCOCCUS AUREUS  Final      Susceptibility   Methicillin resistant staphylococcus aureus - MIC*    CIPROFLOXACIN >=8 RESISTANT Resistant     ERYTHROMYCIN <=0.25 SENSITIVE Sensitive     GENTAMICIN <=0.5 SENSITIVE Sensitive     OXACILLIN >=4 RESISTANT Resistant     TETRACYCLINE <=1 SENSITIVE Sensitive     VANCOMYCIN <=0.5 SENSITIVE Sensitive     TRIMETH/SULFA <=10 SENSITIVE Sensitive     CLINDAMYCIN <=0.25 SENSITIVE Sensitive     RIFAMPIN <=0.5 SENSITIVE Sensitive     Inducible Clindamycin NEGATIVE Sensitive     * METHICILLIN RESISTANT STAPHYLOCOCCUS AUREUS  Blood culture (routine x 2)     Status: None   Collection Time: 06/08/15  8:15 PM  Result Value Ref Range Status    Specimen Description BLOOD LEFT HAND  Final   Special Requests BOTTLES DRAWN AEROBIC AND ANAEROBIC 5CC  Final   Culture  Setup Time   Final    GRAM POSITIVE COCCI IN CLUSTERS IN BOTH AEROBIC AND ANAEROBIC BOTTLES CRITICAL RESULT CALLED TO, READ BACK BY AND VERIFIED WITH: AROYAL,RN AT 1153 06/09/15 BY LBENFIELD    Culture   Final    METHICILLIN RESISTANT STAPHYLOCOCCUS AUREUS SUSCEPTIBILITIES PERFORMED ON PREVIOUS CULTURE WITHIN THE LAST 5 DAYS.    Report Status 06/11/2015 FINAL  Final  Urine culture     Status: None   Collection Time: 06/11/15 11:04 AM  Result Value Ref Range Status   Specimen Description URINE, RANDOM  Final   Special Requests NONE  Final   Culture   Final    MULTIPLE SPECIES PRESENT, SUGGEST RECOLLECTION IF CLINICALLY INDICATED   Report Status 06/12/2015 FINAL  Final  MRSA PCR Screening     Status: Abnormal   Collection Time: 06/12/15  8:37 AM  Result Value Ref Range Status   MRSA by PCR POSITIVE (A) NEGATIVE Final    Comment:        The GeneXpert MRSA Assay (FDA approved for NASAL specimens only), is one component of a comprehensive MRSA colonization surveillance program. It is not intended to diagnose MRSA infection nor to guide or monitor treatment for MRSA infections.      Studies: Mr Brain Wo Contrast  06/10/2015   CLINICAL DATA:  Progressive confusion, weakness with shift wing gait, and fever.  EXAM: MRI HEAD WITHOUT CONTRAST  TECHNIQUE: Multiplanar, multiecho pulse sequences of the brain and surrounding structures were obtained without intravenous contrast.  COMPARISON:  Head CT 06/08/2015  FINDINGS: There are small foci of abnormal hyperintense diffusion weighted signal measuring 6 mm in right frontal cortex and 4 mm in right parietal cortex with normal to slightly restricted diffusion on the ADC map compatible with acute to early subacute infarcts. There is a small cortical and subcortical infarct in the posterior right frontal lobe which appears  subacute to chronic with some T2 shine through on diffusion-weighted imaging. A small amount of chronic blood products are noted associated with this infarct, and there is also a small amount of microhemorrhage more posteriorly in the right frontal lobe, also likely related to prior ischemia.  There is no evidence of mass, midline shift, or extra-axial fluid collection. There is mild cerebral atrophy. Patchy T2 hyperintensities in the periventricular greater than subcortical cerebral white matter bilaterally and in the pons are nonspecific but compatible with mild chronic small vessel ischemic disease. Small, chronic infarcts are noted in the left cerebellar hemisphere and superior vermis.  Orbits are  unremarkable. Paranasal sinuses and left mastoid air cells are clear. There is a small right mastoid effusion. Major intracranial vascular flow voids are preserved.  IMPRESSION: 1. Punctate, acute to early subacute right frontal and right parietal cortical infarcts. 2. Small, subacute to chronic posterior right frontal cortical/subcortical infarct. 3. Chronic cerebellar infarcts. 4. 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   Electronically Signed   By: Sebastian Ache   On: 06/10/2015 14:42   Mr Lumbar Spine Wo Contrast  06/11/2015   CLINICAL DATA:  69 year old diabetic hypertensive male with bacteremia. Generalize weakness. Subsequent encounter.  EXAM: MRI LUMBAR SPINE WITHOUT CONTRAST  TECHNIQUE: Multiplanar, multisequence MR imaging of the lumbar spine was performed. No intravenous contrast was administered.  COMPARISON:  No comparison lumbar examination. MR of the shoulder performed the same date and dictated separately. 06/10/2015 MR brain.  FINDINGS: Exam is motion degraded.  Fluid within the L3-4 and L4-5 facet joints typically related to degenerative changes. Septic arthritis cannot be excluded given history of bacteremia.  Mild edema along the psoas muscles and posterior aspect  of the iliacus muscles can be seen with third spacing of fluid, spread of infection cannot be excluded.  Fluid like signal intensity anterior aspect of the L5-S1 disc without secondary findings of discitis.  T12-L1:  Negative.  L1-2:  Mild facet joint degenerative changes.  Minimal bulge.  L2-3:  Mild facet joint degenerative changes.  Minimal bulge.  L3-4: Facet joint degenerative changes containing fluid as noted above. Mild bulge. Very mild spinal stenosis and very mild bilateral foraminal narrowing.  L4-5: Facet joint degenerative changes containing fluid as noted above. Minimal anterior slip L4. Bulge. Mild bilateral foraminal narrowing. Mild bilateral lateral recess stenosis. Very mild thecal sac narrowing.  L5-S1: Small Schmorl's node deformity. Mild facet joint degenerative changes. Mild bulge. Very mild foraminal narrowing.  IMPRESSION: Exam is motion degraded.  Fluid within the L3-4 and L4-5 facet joints typically related to degenerative changes. Septic arthritis cannot be excluded given history of bacteremia.  Mild edema along the psoas muscles and posterior aspect of the iliacus muscles can be seen with third spacing of fluid, spread of infection cannot be excluded.  Fluid like signal intensity anterior aspect of the L5-S1 disc without secondary findings of discitis.  If there were a high clinical concern of infection involving the lumbar spine than close follow-up imaging may be considered.  Mild degenerative changes most notable L4-5 as detailed above.  Decreased signal intensity of bone marrow may be related to patient's habitus. Correlation with CBC to exclude anemia contributing to this appearance may be considered   Electronically Signed   By: Lacy Duverney M.D.   On: 06/11/2015 18:23   Mr Shoulder Right Wo Contrast  06/12/2015   CLINICAL DATA:  RIGHT shoulder pain. Initial encounter. Bacteremia. Evaluate source of bacteremia.  EXAM: MRI OF THE RIGHT SHOULDER WITHOUT CONTRAST  TECHNIQUE:  Multiplanar, multisequence MR imaging of the shoulder was performed. No intravenous contrast was administered.  COMPARISON:  Radiographs 06/08/2015.  Chest CT 01/03/2013.  FINDINGS: Study is degraded by motion artifact. The patient was unable the comply with scanning and remaining motionless. Technical parameters were modified which produces artifact however these modifications do produce a diagnostic study.  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  Muscles: Mild intramuscular edema, with more prominent edema along the interstitial spaces of the rotator cuff musculature. This is probably a reflection of volume status and anasarca.  Biceps long head:  Normal.  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 Subacromial bursal fluid is present however this probably is a reflection of volume status rather than bursitis.  Glenohumeral Joint: No effusion or synovitis. No evidence of septic arthritis.  Labrum:  Grossly normal.  Bones: Cystic lesion is present in the distal RIGHT clavicle. This was evident on prior plain films in retrospect. The lesion measures 17 mm long axis and the appearance is nonspecific. There are geographic well defined sclerotic margins in the appearance is nonaggressive. 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 Osteomyelitis is unlikely and the area appears chronic. Significantly, no periosteal reaction. Furthermore, this area was visible on prior chest CT from 01/03/2013 and appears unchanged.  IMPRESSION: 1. Motion degraded study. Technical parameters were modified to produce a diagnostic study. 2. Diffuse edema more prominent in the interstitial spaces than in the rotator cuff musculature, likely reflection of volume status/anasarca. 3. Normal glenohumeral joint. No evidence of septic arthritis at the [facility] joint or glenohumeral joint. 4. 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   Electronically  Signed   By: Andreas NewportGeoffrey  Lamke M.D.   On: 06/12/2015 08:14    Scheduled Meds: . aspirin EC  325 mg Oral Daily  . enoxaparin (LOVENOX) injection  40 mg Subcutaneous Q24H  . feeding supplement (ENSURE ENLIVE)  237 mL Oral BID BM  . fluconazole  200 mg Oral Daily  . lisinopril  20 mg Oral Daily   And  . hydrochlorothiazide  25 mg Oral Daily  . insulin aspart  0-15 Units Subcutaneous TID WC  . insulin aspart  0-5 Units Subcutaneous QHS  . potassium chloride  10 mEq Intravenous Q1 Hr x 4  . pravastatin  40 mg Oral QHS  . sodium chloride  3 mL Intravenous Q12H  . vancomycin  500 mg Intravenous Q12H   Continuous Infusions:   Antibiotics Given (last 72 hours)    Date/Time Action Medication Dose Rate   06/09/15 1301 Given   vancomycin (VANCOCIN) IVPB 1000 mg/200 mL premix 1,000 mg 200 mL/hr   06/10/15 0040 Given   vancomycin (VANCOCIN) 500 mg in sodium chloride 0.9 % 100 mL IVPB 500 mg 100 mL/hr   06/10/15 1356 Given   vancomycin (VANCOCIN) 500 mg in sodium chloride 0.9 % 100 mL IVPB 500 mg 100 mL/hr   06/10/15 1415 Given   ceFAZolin (ANCEF) IVPB 1 g/50 mL premix 1 g 100 mL/hr   06/10/15 2234 Given   ceFAZolin (ANCEF) IVPB 1 g/50 mL premix 1 g 100 mL/hr   06/11/15 0015 Given   vancomycin (VANCOCIN) 500 mg in sodium chloride 0.9 % 100 mL IVPB 500 mg 100 mL/hr   06/11/15 0521 Given   ceFAZolin (ANCEF) IVPB 1 g/50 mL premix 1 g 100 mL/hr   06/11/15 1421 Given   vancomycin (VANCOCIN) 500 mg in sodium chloride 0.9 % 100 mL IVPB 500 mg 100 mL/hr   06/12/15 0053 Given   vancomycin (VANCOCIN) 500 mg in sodium chloride 0.9 % 100 mL IVPB 500 mg 100 mL/hr      Active Problems:   Weakness generalized   Confusion   Dehydration   Diabetes mellitus without complication   Pain of right upper extremity   Cerebral thrombosis with cerebral infarction   MRSA bacteremia    Time spent: 25 min    Yuki Purves  [facility] Pager [phone]. If 7PM-7AM, please contact night-coverage at  [email or web address], password [email or web address] 06/12/2015, 12:19 PM  LOS: 4 days

## 2015-06-12 NOTE — OR Nursing (Signed)
Patient noted to Jacob White breathing with apnea up to 10-15 sec.  Dr. Rennis GoldenHilty aware.  Patient to remain on oxygen.  Pt responsive to loud verbal stimulation but appears very very sleepy.

## 2015-06-12 NOTE — Progress Notes (Signed)
Jacob White breathing with 10-15 sec apnea

## 2015-06-12 NOTE — CV Procedure (Signed)
TRANSESOPHAGEAL ECHOCARDIOGRAM (TEE) NOTE  INDICATIONS: infective endocarditis and stroke  PROCEDURE:   Informed consent was obtained prior to the procedure. The risks, benefits and alternatives for the procedure were discussed and the patient comprehended these risks.  Risks include, but are not limited to, cough, sore throat, vomiting, nausea, somnolence, esophageal and stomach trauma or perforation, bleeding, low blood pressure, aspiration, pneumonia, infection, trauma to the teeth and death.    After a procedural time-out, the patient was given 4 mg versed and 75 mcg fentanyl for moderate sedation.  The oropharynx was anesthetized 2 cetacaine sprays.  The transesophageal probe was inserted in the esophagus and stomach without difficulty and multiple views were obtained.  The patient was kept under observation until the patient left the procedure room.  The patient left the procedure room in stable condition.   Agitated microbubble saline contrast was administered.  COMPLICATIONS:    There were no immediate complications.  Findings:  1. LEFT VENTRICLE: The left ventricular wall thickness is moderately increased.  The left ventricular cavity is normal in size. Wall motion is normal.  LVEF is 55-60%.  2. RIGHT VENTRICLE:  The right ventricle is normal in structure and function without any thrombus or masses.    3. LEFT ATRIUM:  The left atrium is dilated in size without any thrombus or masses.  There is not spontaneous echo contrast ("smoke") in the left atrium consistent with a low flow state.  4. LEFT ATRIAL APPENDAGE:  The left atrial appendage is free of any thrombus or masses. The appendage has single lobes. Pulse doppler indicates moderate flow in the appendage.  5. ATRIAL SEPTUM:  Aneurysmal. 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 The ostial area is 0.4 cm2. There appears to be an adequate rim of tissue around the ostium for possible  closure.  There is evidence for brisk bidirectional interatrial shunting by color doppler and saline microbubble, with and without provocative maneuvers.  6. RIGHT ATRIUM:  The right atrium is dilated without any thrombus or masses.  7. MITRAL VALVE:  The mitral valve is normal in structure and function with Mild regurgitation.  There were no vegetations or stenosis.  8. AORTIC VALVE:  The aortic valve is trileaflet, normal in structure and function with no regurgitation.  There were no vegetations or stenosis  9. TRICUSPID VALVE:  The tricuspid valve is normal in structure and function with Mild regurgitation.  There were no vegetations or stenosis  10.  PULMONIC VALVE:  The pulmonic valve is normal in structure and function with no regurgitation.  There were no vegetations or stenosis.   11. AORTIC ARCH, ASCENDING AND DESCENDING AORTA:  There was grade 2 Myrtis Ser et. Al, 1992) atherosclerosis of the aortic arch and proximal descending aorta. The aorta was noted to be tortuous. The ascending aorta is mildly dilated at 4.1 cm.  12. PULMONARY VEINS: Anomalous pulmonary venous return was not noted.  13. PERICARDIUM: The pericardium appeared normal and non-thickened.  There is no pericardial effusion.  IMPRESSION:   1. No LAA thrombus 2. 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 There appears to be an adequate "rim" of atrial septal tissue for possible device closure. 3. Moderate concentric LVH 4. LVEF 55-60% 5. No evidence for valvular endocarditis  RECOMMENDATIONS:  1. Consider structural heart team consultation ([facility]oper or [facility]) for possible Amplatzer-device closure of moderate-sized secundum ASD with bidirectional flow. 2. Recommend venous dopplers of the lower extremities, if not  already performed  Time Spent Directly with the Patient:  60 minutes   Chrystie NoseKenneth C. Sundy Houchins, MD, [facility] St[facility]ACC Attending Cardiologist [facility]  06/12/2015, 3:08 PM

## 2015-06-12 NOTE — H&P (Deleted)
     INTERVAL PROCEDURE H&P  History and Physical Interval Note:  06/12/2015 2:14 PM  Jacob White has presented today for their planned procedure. The various methods of treatment have been discussed with the patient and family. After consideration of risks, benefits and other options for treatment, the patient has consented to the procedure.  The patients' outpatient history has been reviewed, patient examined, and no change in status from most recent office note within the past 30 days. I have reviewed the patients' chart and labs and will proceed as planned. Questions were answered to the patient's satisfaction.   Chrystie NoseKenneth C. Hilty, MD, [facility]ACC Attending Cardiologist [facility]  Lisette AbuKenneth C [facility] 06/12/2015, 2:14 PM

## 2015-06-12 NOTE — Progress Notes (Signed)
Frequent PVC with some couplets

## 2015-06-12 NOTE — H&P (Signed)
     INTERVAL PROCEDURE H&P  History and Physical Interval Note:  06/12/2015 1:16 PM  Jacob White has presented today for their planned procedure. The various methods of treatment have been discussed with the patient and family. After consideration of risks, benefits and other options for treatment, the patient has consented to the procedure.  The patients' outpatient history has been reviewed, patient examined, and no change in status from most recent office note within the past 30 days. I have reviewed the patients' chart and labs and will proceed as planned. Questions were answered to the patient's satisfaction.   Jacob NoseKenneth C. Jariana Shumard, MD, [facility]ACC Attending Cardiologist [facility]  Jacob White 06/12/2015, 1:16 PM

## 2015-06-12 NOTE — Progress Notes (Signed)
ANTIBIOTIC CONSULT NOTE - FOLLOW UP  Pharmacy Consult for vancomycin Indication: MRSA bacteremia   No Known Allergies  Patient Measurements: Height: 5\' 6"  (167.6 cm) Weight: 134 lb 8 oz (61.009 kg) IBW/kg (Calculated) : 63.8  Vital Signs: Temp: 99 F (37.2 C) (06/28 1410) Temp Source: Oral (06/28 1410) BP: 121/86 mmHg (06/28 1433) Pulse Rate: 93 (06/28 1433) Intake/Output from previous day: 06/27 0701 - 06/28 0700 In: 480 [P.O.:480] Out: 1400 [Urine:1400] Intake/Output from this shift: Total I/O In: 150 [Other:50; IV Piggyback:100] Out: 1150 [Urine:1150]  Labs:  Recent Labs  06/11/15 0629 06/12/15 0642  WBC 7.1 9.5  HGB 11.8* 13.1  PLT 125* 145*  CREATININE 0.99 0.96   Estimated Creatinine Clearance: 61.8 mL/min (by C-G formula based on Cr of 0.96).  Recent Labs  06/12/15 1334  VANCOTROUGH 9*     Microbiology: Recent Results (from the past 720 hour(s))  Blood culture (routine x 2)     Status: None   Collection Time: 06/08/15  8:10 PM  Result Value Ref Range Status   Specimen Description BLOOD RIGHT HAND  Final   Special Requests BOTTLES DRAWN AEROBIC AND ANAEROBIC 5CC  Final   Culture  Setup Time   Final    GRAM POSITIVE COCCI IN PAIRS IN BOTH AEROBIC AND ANAEROBIC BOTTLES CRITICAL RESULT CALLED TO, READ BACK BY AND VERIFIED WITH: AROYAL,RN AT 1053 06/09/15 BY LBENFIELD    Culture METHICILLIN RESISTANT STAPHYLOCOCCUS AUREUS  Final   Report Status 06/11/2015 FINAL  Final   Organism ID, Bacteria METHICILLIN RESISTANT STAPHYLOCOCCUS AUREUS  Final      Susceptibility   Methicillin resistant staphylococcus aureus - MIC*    CIPROFLOXACIN >=8 RESISTANT Resistant     ERYTHROMYCIN <=0.25 SENSITIVE Sensitive     GENTAMICIN <=0.5 SENSITIVE Sensitive     OXACILLIN >=4 RESISTANT Resistant     TETRACYCLINE <=1 SENSITIVE Sensitive     VANCOMYCIN <=0.5 SENSITIVE Sensitive     TRIMETH/SULFA <=10 SENSITIVE Sensitive     CLINDAMYCIN <=0.25 SENSITIVE Sensitive      RIFAMPIN <=0.5 SENSITIVE Sensitive     Inducible Clindamycin NEGATIVE Sensitive     * METHICILLIN RESISTANT STAPHYLOCOCCUS AUREUS  Blood culture (routine x 2)     Status: None   Collection Time: 06/08/15  8:15 PM  Result Value Ref Range Status   Specimen Description BLOOD LEFT HAND  Final   Special Requests BOTTLES DRAWN AEROBIC AND ANAEROBIC 5CC  Final   Culture  Setup Time   Final    GRAM POSITIVE COCCI IN CLUSTERS IN BOTH AEROBIC AND ANAEROBIC BOTTLES CRITICAL RESULT CALLED TO, READ BACK BY AND VERIFIED WITH: AROYAL,RN AT 1153 06/09/15 BY LBENFIELD    Culture   Final    METHICILLIN RESISTANT STAPHYLOCOCCUS AUREUS SUSCEPTIBILITIES PERFORMED ON PREVIOUS CULTURE WITHIN THE LAST 5 DAYS.    Report Status 06/11/2015 FINAL  Final  Culture, blood (routine x 2)     Status: None (Preliminary result)   Collection Time: 06/11/15  4:50 AM  Result Value Ref Range Status   Specimen Description BLOOD RIGHT ARM  Final   Special Requests BOTTLES DRAWN AEROBIC AND ANAEROBIC 8CC 8CC  Final   Culture NO GROWTH 1 DAY  Final   Report Status PENDING  Incomplete  Culture, blood (routine x 2)     Status: None (Preliminary result)   Collection Time: 06/11/15  5:08 AM  Result Value Ref Range Status   Specimen Description BLOOD RIGHT ARM  Final   Special Requests BOTTLES DRAWN  AEROBIC ONLY 7CC  Final   Culture NO GROWTH 1 DAY  Final   Report Status PENDING  Incomplete  Urine culture     Status: None   Collection Time: 06/11/15 11:04 AM  Result Value Ref Range Status   Specimen Description URINE, RANDOM  Final   Special Requests NONE  Final   Culture   Final    MULTIPLE SPECIES PRESENT, SUGGEST RECOLLECTION IF CLINICALLY INDICATED   Report Status 06/12/2015 FINAL  Final  MRSA PCR Screening     Status: Abnormal   Collection Time: 06/12/15  8:37 AM  Result Value Ref Range Status   MRSA by PCR POSITIVE (A) NEGATIVE Final    Comment:        The GeneXpert MRSA Assay (FDA approved for NASAL  specimens only), is one component of a comprehensive MRSA colonization surveillance program. It is not intended to diagnose MRSA infection nor to guide or monitor treatment for MRSA infections.     Anti-infectives    Start     Dose/Rate Route Frequency Ordered Stop   06/10/15 1315  ceFAZolin (ANCEF) IVPB 1 g/50 mL premix  Status:  Discontinued     1 g 100 mL/hr over 30 Minutes Intravenous Every 8 hours 06/10/15 1257 06/11/15 1314   06/10/15 0100  vancomycin (VANCOCIN) 500 mg in sodium chloride 0.9 % 100 mL IVPB     500 mg 100 mL/hr over 60 Minutes Intravenous Every 12 hours 06/09/15 1212     06/09/15 1215  vancomycin (VANCOCIN) IVPB 1000 mg/200 mL premix     1,000 mg 200 mL/hr over 60 Minutes Intravenous  Once 06/09/15 1208 06/09/15 1401   06/09/15 1000  fluconazole (DIFLUCAN) tablet 200 mg     200 mg Oral Daily 06/09/15 0635        Assessment: 68 YOM on vancomycin for MRSA bacteremia. WBC 9.6 > 7.1, Tmax 101. ID following, TEE (6/28) results pending. Nothing specific noted on MRI shoulder and lumbar, some fluid but non speicific. Vancomycin trough level = 9, subtherapeutic, drawn 1 hr late. Renal function stable, scr 0.96, est. crcl ~ 60 ml/min. Tm 100.6, wbc 9.5  6/24 BC X 2 - MRSA  6/27 Urine - neg  6/27 Blood x 2   Vanc 6/25>>  Diflucan 6/25>>  Ancef 6/26>>6/27   Goal of Therapy:  Vancomycin trough level 15-20 mcg/ml  Plan:  - Increase vancomycin to 750 mg IV Q 12 hrs - Monitor renal function f/u clinical course  Bayard Hugger, PharmD, BCPS  Clinical Pharmacist  Pager: [phone]   06/12/2015,2:40 PM

## 2015-06-12 NOTE — Progress Notes (Signed)
[facility]  Date of Admission:  06/08/2015  Antibiotics: vancomycin  Subjective: Does not feel well  Objective: Temp:  [98.6 F (37 C)-100.6 F (38.1 C)] 98.6 F (37 C) (06/28 0515) Pulse Rate:  [93-96] 94 (06/28 0515) Resp:  [18] 18 (06/28 0515) BP: (114-136)/(76-87) 129/76 mmHg (06/28 0515) SpO2:  [93 %-94 %] 93 % (06/28 0515)  General: awake, in bed, nad Skin: no rashes Lungs: CTA B Cor: RRR without m Abdomen: soft, nt, nd Ext: no edema  Lab Results Lab Results  Component Value Date   WBC 9.5 06/12/2015   HGB 13.1 06/12/2015   HCT 37.7* 06/12/2015   MCV 84.2 06/12/2015   PLT 145* 06/12/2015    Lab Results  Component Value Date   CREATININE 0.96 06/12/2015   BUN 17 06/12/2015   NA 129* 06/12/2015   K 2.9* 06/12/2015   CL 91* 06/12/2015   CO2 29 06/12/2015    Lab Results  Component Value Date   ALT 19 06/08/2015   AST 21 06/08/2015   ALKPHOS 107 06/08/2015   BILITOT 0.8 06/08/2015      Microbiology: Recent Results (from the past 240 hour(s))  Blood culture (routine x 2)     Status: None   Collection Time: 06/08/15  8:10 PM  Result Value Ref Range Status   Specimen Description BLOOD RIGHT HAND  Final   Special Requests BOTTLES DRAWN AEROBIC AND ANAEROBIC 5CC  Final   Culture  Setup Time   Final    GRAM POSITIVE COCCI IN PAIRS IN BOTH AEROBIC AND ANAEROBIC BOTTLES CRITICAL RESULT CALLED TO, READ BACK BY AND VERIFIED WITH: AROYAL,RN AT 1053 06/09/15 BY LBENFIELD    Culture METHICILLIN RESISTANT STAPHYLOCOCCUS AUREUS  Final   Report Status 06/11/2015 FINAL  Final   Organism ID, Bacteria METHICILLIN RESISTANT STAPHYLOCOCCUS AUREUS  Final      Susceptibility   Methicillin resistant staphylococcus aureus - MIC*    CIPROFLOXACIN >=8 RESISTANT Resistant     ERYTHROMYCIN <=0.25 SENSITIVE Sensitive     GENTAMICIN <=0.5 SENSITIVE Sensitive     OXACILLIN >=4 RESISTANT Resistant     TETRACYCLINE <=1 SENSITIVE Sensitive    VANCOMYCIN <=0.5 SENSITIVE Sensitive     TRIMETH/SULFA <=10 SENSITIVE Sensitive     CLINDAMYCIN <=0.25 SENSITIVE Sensitive     RIFAMPIN <=0.5 SENSITIVE Sensitive     Inducible Clindamycin NEGATIVE Sensitive     * METHICILLIN RESISTANT STAPHYLOCOCCUS AUREUS  Blood culture (routine x 2)     Status: None   Collection Time: 06/08/15  8:15 PM  Result Value Ref Range Status   Specimen Description BLOOD LEFT HAND  Final   Special Requests BOTTLES DRAWN AEROBIC AND ANAEROBIC 5CC  Final   Culture  Setup Time   Final    GRAM POSITIVE COCCI IN CLUSTERS IN BOTH AEROBIC AND ANAEROBIC BOTTLES CRITICAL RESULT CALLED TO, READ BACK BY AND VERIFIED WITH: AROYAL,RN AT 1153 06/09/15 BY LBENFIELD    Culture   Final    METHICILLIN RESISTANT STAPHYLOCOCCUS AUREUS SUSCEPTIBILITIES PERFORMED ON PREVIOUS CULTURE WITHIN THE LAST 5 DAYS.    Report Status 06/11/2015 FINAL  Final  Culture, blood (routine x 2)     Status: None (Preliminary result)   Collection Time: 06/11/15  4:50 AM  Result Value Ref Range Status   Specimen Description BLOOD RIGHT ARM  Final   Special Requests BOTTLES DRAWN AEROBIC AND ANAEROBIC 8CC 8CC  Final   Culture NO GROWTH 1 DAY  Final  Report Status PENDING  Incomplete  Culture, blood (routine x 2)     Status: None (Preliminary result)   Collection Time: 06/11/15  5:08 AM  Result Value Ref Range Status   Specimen Description BLOOD RIGHT ARM  Final   Special Requests BOTTLES DRAWN AEROBIC ONLY 7CC  Final   Culture NO GROWTH 1 DAY  Final   Report Status PENDING  Incomplete  Urine culture     Status: None   Collection Time: 06/11/15 11:04 AM  Result Value Ref Range Status   Specimen Description URINE, RANDOM  Final   Special Requests NONE  Final   Culture   Final    MULTIPLE SPECIES PRESENT, SUGGEST RECOLLECTION IF CLINICALLY INDICATED   Report Status 06/12/2015 FINAL  Final  MRSA PCR Screening     Status: Abnormal   Collection Time: 06/12/15  8:37 AM  Result Value Ref Range  Status   MRSA by PCR POSITIVE (A) NEGATIVE Final    Comment:        The GeneXpert MRSA Assay (FDA approved for NASAL specimens only), is one component of a comprehensive MRSA colonization surveillance program. It is not intended to diagnose MRSA infection nor to guide or monitor treatment for MRSA infections.     Studies/Results: Jacob Lumbar Spine Wo Contrast  06/11/2015   CLINICAL DATA:  69 year old diabetic hypertensive male with bacteremia. Generalize weakness. Subsequent encounter.  EXAM: MRI LUMBAR SPINE WITHOUT CONTRAST  TECHNIQUE: Multiplanar, multisequence Jacob imaging of the lumbar spine was performed. No intravenous contrast was administered.  COMPARISON:  No comparison lumbar examination. Jacob of the White performed the same date and dictated separately. 06/10/2015 Jacob brain.  FINDINGS: Exam is motion degraded.  Fluid within the L3-4 and L4-5 facet joints typically related to degenerative changes. Septic arthritis cannot be excluded given history of bacteremia.  Mild edema along the psoas muscles and posterior aspect of the iliacus muscles can be seen with third spacing of fluid, spread of infection cannot be excluded.  Fluid like signal intensity anterior aspect of the L5-S1 disc without secondary findings of discitis.  T12-L1:  Negative.  L1-2:  Mild facet joint degenerative changes.  Minimal bulge.  L2-3:  Mild facet joint degenerative changes.  Minimal bulge.  L3-4: Facet joint degenerative changes containing fluid as noted above. Mild bulge. Very mild spinal stenosis and very mild bilateral foraminal narrowing.  L4-5: Facet joint degenerative changes containing fluid as noted above. Minimal anterior slip L4. Bulge. Mild bilateral foraminal narrowing. Mild bilateral lateral recess stenosis. Very mild thecal sac narrowing.  L5-S1: Small Schmorl's node deformity. Mild facet joint degenerative changes. Mild bulge. Very mild foraminal narrowing.  IMPRESSION: Exam is motion degraded.  Fluid  within the L3-4 and L4-5 facet joints typically related to degenerative changes. Septic arthritis cannot be excluded given history of bacteremia.  Mild edema along the psoas muscles and posterior aspect of the iliacus muscles can be seen with third spacing of fluid, spread of infection cannot be excluded.  Fluid like signal intensity anterior aspect of the L5-S1 disc without secondary findings of discitis.  If there were a high clinical concern of infection involving the lumbar spine than close follow-up imaging may be considered.  Mild degenerative changes most notable L4-5 as detailed above.  Decreased signal intensity of bone marrow may be related to patient's habitus. Correlation with CBC to exclude anemia contributing to this appearance may be considered   Electronically Signed   By: Lacy DuverneySteven  Olson M.D.   On: 06/11/2015  18:23   Jacob White Right Wo Contrast  06/12/2015   CLINICAL DATA:  RIGHT White pain. Initial encounter. Bacteremia. Evaluate source of bacteremia.  EXAM: MRI OF THE RIGHT White WITHOUT CONTRAST  TECHNIQUE: Multiplanar, multisequence Jacob imaging of the White was performed. No intravenous contrast was administered.  COMPARISON:  Radiographs 06/08/2015.  Chest CT 01/03/2013.  FINDINGS: Study is degraded by motion artifact. The patient was unable the comply with scanning and remaining motionless. Technical parameters were modified which produces artifact however these modifications do produce a diagnostic study.  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  Muscles: Mild intramuscular edema, with more prominent edema along the interstitial spaces of the rotator cuff musculature. This is probably a reflection of volume status and anasarca.  Biceps long head:  Normal.  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 Subacromial bursal fluid is present however this probably is a reflection of volume status rather than bursitis.  Glenohumeral Joint:  No effusion or synovitis. No evidence of septic arthritis.  Labrum:  Grossly normal.  Bones: Cystic lesion is present in the distal RIGHT clavicle. This was evident on prior plain films in retrospect. The lesion measures 17 mm long axis and the appearance is nonspecific. There are geographic well defined sclerotic margins in the appearance is nonaggressive. 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 Osteomyelitis is unlikely and the area appears chronic. Significantly, no periosteal reaction. Furthermore, this area was visible on prior chest CT from 01/03/2013 and appears unchanged.  IMPRESSION: 1. Motion degraded study. Technical parameters were modified to produce a diagnostic study. 2. Diffuse edema more prominent in the interstitial spaces than in the rotator cuff musculature, likely reflection of volume status/anasarca. 3. Normal glenohumeral joint. No evidence of septic arthritis at the [facility] joint or glenohumeral joint. 4. 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   Electronically Signed   By: Andreas Newport M.D.   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  Nothing specific noted on MRI White and lumbar, some fluid but non speicific.  TEE today  COMER, Molly Maduro, MD [facility] [email or web address] [phone] pager   [phone] cell 06/12/2015, 1:58 PM

## 2015-06-12 NOTE — PMR Pre-admission (Signed)
PMR Admission Coordinator Pre-Admission Assessment  Patient: Jacob White is an 69 y.o., male MRN: [id number] DOB: 01/01/1946 Height: 5\' 6"  (167.6 cm) Weight: 61.009 kg (134 lb 8 oz)              Insurance Information HMO: No    PPO:       PCP:       IPA:       80/20:       OTHER:   PRIMARY: Medicare A/B      Policy#: [id number]      Subscriber: Jacob White CM Name:        Phone#:       Fax#:   Pre-Cert#:        Employer: Retired Benefits:  Phone #:       Name: Checked in [employer] 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 male [facility] 06/[facility]/2015 after seeing his primary care physician for right shoulder pain. The patient injured his shoulder while trying to move a picnic table while sitting on his riding mower. During that visit the patient's wife reported that the patient has been very lethargic recently and has been increasingly confused over the past 3-4 weeks. 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 Hospital course complicated by MRSA bacteremia and patient placed on IV vancomycin for 4 weeks of  Treatment. Will need a PICC line placed.  Dr. Luciana Axeomer consulted for input and recommended full work up to evaluate source of bacteremia. Repeat blood cultures done on 06/27 and pending. MRI of the lumbar spine shows fluid within L3/4 and L4/5 facet joints likely  due to degenerative changes and mild edema along psoas and posterior aspect of iliacus muscle likely due to third spacing or spread of infection. MRI right shoulder shows diffuse edema in interstitial spaces likely due to anasarca, rotator cuff degeneration and no signs of septic arthritis. 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 Pt has regressed from a functional standpoint since admission when he was min assist with PT. CIR recommended by MD and rehab team and patient now cleared for admission to inpatient rehab.     Total: 3=NIH  Past Medical History  Past Medical History  Diagnosis Date  . Diabetes mellitus without complication   . Hypertension   . Hyperlipemia     Family History  family history is not on file.  Prior Rehab/Hospitalizations:  Has the patient had major surgery during 100 days prior to admission? No  Current Medications   Current facility-administered medications:  .  acetaminophen (TYLENOL) solution 500 mg, 500 mg, Oral, Q6H PRN, Joseph ArtJessica U Vann, DO .  acetaminophen (TYLENOL) tablet 650 mg, 650 mg, Oral, Q6H  PRN, 650 mg at 06/11/15 2344 **OR** acetaminophen (TYLENOL) suppository 650 mg, 650 mg, Rectal, Q6H PRN, Rhona Raider Stinson, DO .  alum & mag hydroxide-simeth (MAALOX/MYLANTA) 200-200-20 MG/5ML suspension 30 mL, 30 mL, Oral, Q6H PRN, Rhona Raider Stinson, DO .  aspirin EC tablet 325 mg, 325 mg, Oral, Daily, Layne Benton, NP, 325 mg at 06/13/15 1111 .  Chlorhexidine Gluconate Cloth 2 % PADS 6 each, 6 each, Topical, Q0600, Joseph Art, DO, 6 each at 06/13/15 0607 .  enoxaparin (LOVENOX) injection 40 mg, 40 mg, Subcutaneous, Q24H, Rhona Raider Stinson, DO, 40 mg at 06/12/15 2113 .  feeding supplement (ENSURE ENLIVE) (ENSURE ENLIVE) liquid 237 mL, 237 mL, Oral, BID BM, Jessica U Vann, DO, 237 mL at 06/13/15 1000 .  fluconazole (DIFLUCAN) tablet 200 mg, 200 mg, Oral, Daily, Rhona Raider Stinson, DO, 200 mg at 06/13/15 1110 .   lisinopril (PRINIVIL,ZESTRIL) tablet 20 mg, 20 mg, Oral, Daily, 20 mg at 06/13/15 1110 **AND** hydrochlorothiazide (HYDRODIURIL) tablet 25 mg, 25 mg, Oral, Daily, Bertram Millard, RPH, 25 mg at 06/13/15 1110 .  insulin aspart (novoLOG) injection 0-15 Units, 0-15 Units, Subcutaneous, TID WC, Rhona Raider Stinson, DO, 5 Units at 06/13/15 1232 .  insulin aspart (novoLOG) injection 0-5 Units, 0-5 Units, Subcutaneous, QHS, Rhona Raider Emmett, DO, 2 Units at 06/08/15 2231 .  [START ON 06/14/2015] insulin glargine (LANTUS) injection 12 Units, 12 Units, Subcutaneous, Daily, Jessica U Vann, DO .  magnesium sulfate IVPB 2 g 50 mL, 2 g, Intravenous, Once, Jessica U Vann, DO, 2 g at 06/13/15 1235 .  mupirocin ointment (BACTROBAN) 2 % 1 application, 1 application, Nasal, BID, Joseph Art, DO, 1 application at 06/13/15 1111 .  ondansetron (ZOFRAN) tablet 4 mg, 4 mg, Oral, Q6H PRN **OR** ondansetron (ZOFRAN) injection 4 mg, 4 mg, Intravenous, Q6H PRN, Rhona Raider Stinson, DO .  oxyCODONE (Oxy IR/ROXICODONE) immediate release tablet 5 mg, 5 mg, Oral, Q4H PRN, Rhona Raider Stinson, DO, 5 mg at 06/09/15 1610 .  pravastatin (PRAVACHOL) tablet 40 mg, 40 mg, Oral, QHS, Rhona Raider Stinson, DO, 40 mg at 06/12/15 2113 .  senna-docusate (Senokot-S) tablet 1 tablet, 1 tablet, Oral, QHS PRN, Levie Heritage, DO, 1 tablet at 06/11/15 2126 .  sodium chloride 0.9 % injection 3 mL, 3 mL, Intravenous, Q12H, Rhona Raider Stinson, DO, 3 mL at 06/12/15 2114 .  vancomycin (VANCOCIN) IVPB 750 mg/150 ml premix, 750 mg, Intravenous, Q12H, Robinette Haines, RPH, 750 mg at 06/13/15 0400  Patients Current Diet: Diet heart healthy/carb modified Room service appropriate?: Yes; Fluid consistency:: Thin  Precautions / Restrictions Precautions Precautions: Fall Restrictions Weight Bearing Restrictions: No   Has the patient had 2 or more falls or a fall with injury in the past year?No  Prior Activity Level Went out daily up until 3-4 weeks ago.  Worked [employer].  Was active.   Home Assistive Devices / Equipment Home Assistive Devices/Equipment: Dentures (specify type), CBG Meter Home Equipment: Cane - single point  Prior Device Use: Indicate devices/aids used by the patient prior to current illness, exacerbation or injury? None of the above.  Used no device, completely independent.  Prior Functional Level Prior Function Level of Independence: Independent  Self Care: Did the patient need help bathing, dressing, using the toilet or eating?  Independent  Indoor Mobility: Did the patient need assistance with walking from room to room (with or without device)? Independent  Stairs: Did the patient need assistance with internal or external stairs (  with or without device)? Independent  Functional Cognition: Did the patient need help planning regular tasks such as shopping or remembering to take medications? Independent  Current Functional Level Cognition  Overall Cognitive Status: Within Functional Limits for tasks assessed Orientation Level: Oriented X4    Extremity Assessment (includes Sensation/Coordination)  Upper Extremity Assessment: RUE deficits/detail RUE Deficits / Details: Full AROM, decreased strength 3+/5 shoulder (per White pt has rotator cuff issues on this side, rest 4/5  Lower Extremity Assessment: Generalized weakness    ADLs  Overall ADL's : Needs assistance/impaired Eating/Feeding: Independent (supported sitting) Grooming: Set up (supported sitting) Upper Body Bathing: Set up (supported sitting) Lower Body Bathing: Moderate assistance (with min A sit<>stand and to maintain standing) Upper Body Dressing : Minimal assistance (supported sitting) Lower Body Dressing: Maximal assistance (with min A sit<>stand and to maintain standing) Toilet Transfer: Minimal assistance, Stand-pivot, BSC Toileting- Clothing Manipulation and Hygiene: Moderate assistance (with min A sit<>stand and to maintain standing)    Mobility   Overal bed mobility: Needs Assistance Bed Mobility: Supine to Sit, Sit to Supine Supine to sit: Min assist, HOB elevated Sit to supine: Min guard General bed mobility comments: Assist to bring trunk up. Pt with posterior lean.    Transfers  Overall transfer level: Needs assistance Equipment used: 1 person hand held assist Transfers: Sit to/from Stand Sit to Stand: Min assist General transfer comment: increased time and cues to correct posterior lean    Ambulation / Gait / Stairs / Wheelchair Mobility  Ambulation/Gait Ambulation/Gait assistance: Mod assist, +2 safety/equipment Ambulation Distance (Feet): 40 Feet (x 2) Assistive device: Rolling walker (2 wheeled) General Gait Details: Pt with posterior lean. Verbal cues to look up. Gait Pattern/deviations: Step-through pattern, Decreased step length - right, Decreased step length - left, Leaning posteriorly, Shuffle Gait velocity: decr Gait velocity interpretation: Below normal speed for age/gender    Posture / Balance Dynamic Sitting Balance Sitting balance - Comments: posterior lean with VCs to reach forward below knees to correct lean Balance Overall balance assessment: Needs assistance Sitting-balance support: Feet supported, Bilateral upper extremity supported Sitting balance-Leahy Scale: Fair Sitting balance - Comments: posterior lean with VCs to reach forward below knees to correct lean Postural control: Posterior lean Standing balance support: Bilateral upper extremity supported Standing balance-Leahy Scale: Poor Standing balance comment: min A for posterior lean with increased time and cues to correct    Special needs/care consideration BiPAP/CPAP No CPM No Continuous Drip IV No Dialysis No         Life Vest No Oxygen No Special Bed No Trach Size No Wound Vac (area) No       Skin Injured right shoulder recently, but no skin issues                              Bowel mgmt: Last BM 06/10/15 Bladder mgmt: Condom  catheter in place, some incontinence. Diabetic mgmt Yes, or oral medications at home.    Previous Home Environment Living Arrangements: Spouse/significant other, Other relatives (grand-daughter and 13 year old great-granddaughter) Available Help at Discharge: Available 24 hours/day Type of Home: Mobile home Home Layout: One level Home Access: Stairs to enter Entrance Stairs-Rails: Left, Right, Can reach both Entrance Stairs-Number of Steps: 4 Home Care Services: No  Discharge Living Setting Plans for Discharge Living Setting: Patient's home, Mobile Home Type of Home at Discharge: Mobile home (Single wide mobile home.) Discharge Home Layout: One level Discharge Home Access:  Stairs to enter [employer] of Steps: 4 steps Does the patient have any problems obtaining your medications?: No  Social/Family/Support Systems Patient Roles: Spouse, Parent (Has a wife and a White.) Contact Information: Shafiq Larch - wife Anticipated Caregiver: wife Anticipated Caregiver's Contact Information: Bonita Quin [phone] Ability/Limitations of Caregiver: Wife is retired and can Geneticist, molecular Availability: 24/7 Discharge Plan Discussed with Primary Caregiver: Yes Is Caregiver In Agreement with Plan?: Yes Does Caregiver/Family have Issues with Lodging/Transportation while Pt is in Rehab?: No  Goals/Additional Needs Patient/Family Goal for Rehab: PT/OT/ST mod I goals Expected length of stay: 7-10 days Cultural Considerations: Attends [employer] Dietary Needs: Dys 3, thin liquids Equipment Needs: TBD Pt/Family Agrees to Admission and willing to participate: Yes Program Orientation Provided & Reviewed with Pt/Caregiver Including Roles  & Responsibilities: Yes  Decrease burden of Care through IP rehab admission: N/A  Possible need for SNF placement upon discharge: Not anticipated  Patient Condition: This patient's condition remains as documented in the consult  dated 06/11/15, in which the Rehabilitation Physician determined and documented that the patient's condition is appropriate for intensive rehabilitative care in an inpatient rehabilitation facility. Will admit to inpatient rehab today.  Preadmission Screen Completed By:  Trish Mage, 06/13/2015 1:28 PM ______________________________________________________________________   Discussed status with Dr. Riley Kill on 06/13/15 at  1329 and received telephone approval for admission today.  Admission Coordinator:  Trish Mage, time1329/Date06/29/16

## 2015-06-12 NOTE — Progress Notes (Signed)
STROKE TEAM PROGRESS NOTE   HISTORY Jacob White is a 69 y.o. male with a history of diabetes mellitus, hypertension, and hyperlipidemia who was admitted to [facility] on 06/08/2015 after seeing his primary care physician for right shoulder pain. The patient injured his shoulder while trying to move a picnic table while sitting on his riding mower. During that visit the patient's wife reported that the patient has been very lethargic recently and has been increasingly confused over the past 3-4 weeks. She had never known the patient to be confused in the past. He is usually very active with little projects around the house but now seems to sleep all of the time.The onset of these symptoms has been gradual but he seems to be getting worse. He has had poor PO intake of both liquids and solids. He apparently has had some difficulty swallowing solid foods. The patient was admitted for further evaluation. 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 The patient denies any history of irregular heartbeats or palpitations. He was not taking aspirin prior to admission. He was not good about seeing his physician on a regular basis and he seldom complained of any health issues. He had a fever at time of admission with foul-smelling urine and blood cultures that came back positive for Staphylococcus aureus. He is currently on IV Ancef, vancomycin, and Diflucan. All of the history was obtained from the patient's wife. Unable to determine last known well. Patient was not administered TPA secondary to unknown time of onset. He was admitted for further evaluation and treatment.   SUBJECTIVE (INTERVAL HISTORY) Patients states he is doing better with improving left sided strength. No new complaints. Patient had positive blood culture for staph aureus hence TEE has been ordered for later  today  OBJECTIVE Temp:  [98.6 F (37 C)-100.6 F (38.1 C)] 98.6 F (37 C) (06/28 0515) Pulse Rate:  [93-96] 94 (06/28 0515) Cardiac Rhythm:  [-] Normal sinus rhythm (06/28 0744) Resp:  [18] 18 (06/28 0515) BP: (114-136)/(76-87) 129/76 mmHg (06/28 0515) SpO2:  [93 %-94 %] 93 % (06/28 0515)   Recent Labs Lab 06/11/15 1752 06/11/15 2204 06/12/15 0132 06/12/15 0800 06/12/15 1231  GLUCAP 162* 124* 134* 143* 128*    Recent Labs Lab 06/08/15 1718 06/09/15 0640 06/11/15 0629 06/12/15 0642  NA 130* 131* 130* 129*  K 3.7 3.5 2.8* 2.9*  CL 91* 98* 95* 91*  CO2 26 21* 24 29  GLUCOSE 240* 173* 141* 150*  BUN 40* 29* 18 17  CREATININE 1.31* 1.10 0.99 0.96  CALCIUM 8.8* 8.0* 7.7* 7.6*  MG  --   --  1.5*  --     Recent Labs Lab 06/08/15 1718  AST 21  ALT 19  ALKPHOS 107  BILITOT 0.8  PROT 7.4  ALBUMIN 2.8*    Recent Labs Lab 06/08/15 1718 06/11/15 0629 06/12/15 0642  WBC 9.6 7.1 9.5  NEUTROABS 8.3*  --   --   HGB 13.7 11.8* 13.1  HCT 39.3 33.9* 37.7*  MCV 84.9 83.3 84.2  PLT 184 125* 145*   No results for input(s): CKTOTAL, CKMB, CKMBINDEX, TROPONINI in the last 168 hours. No results for input(s): LABPROT, INR in the last 72 hours. No results for input(s): COLORURINE, LABSPEC, PHURINE, GLUCOSEU, HGBUR, BILIRUBINUR, KETONESUR, PROTEINUR, UROBILINOGEN, NITRITE, LEUKOCYTESUR in the last 72 hours.  Invalid input(s): APPERANCEUR     Component Value  Date/Time   CHOL 79 06/11/2015 0629   TRIG 156* 06/11/2015 0629   HDL 6* 06/11/2015 0629   CHOLHDL 13.2 06/11/2015 0629   VLDL 31 06/11/2015 0629   LDLCALC 42 06/11/2015 0629   Lab Results  Component Value Date   HGBA1C 8.9* 06/11/2015   No results found for: LABOPIA, COCAINSCRNUR, LABBENZ, AMPHETMU, THCU, LABBARB  No results for input(s): ETH in the last 168 hours.  Dg Chest 2 View  06/08/2015   CLINICAL DATA:  Patient was moving picnic table. Felt sharp pain on the right shoulder. Pain under the breast on  the anterior sign. Shortness of breath. History of hypertension, diabetes, smoking.  EXAM: CHEST  2 VIEW  COMPARISON:  None.  FINDINGS: The heart size and mediastinal contours are within normal limits. Both lungs are clear. The visualized skeletal structures are unremarkable.  IMPRESSION: No active cardiopulmonary disease.   Electronically Signed   By: Norva Pavlov M.D.   On: 06/08/2015 17:02   Dg Ribs Unilateral Right  06/08/2015   CLINICAL DATA:  Right chest pain while moving picnic table, initial encounter  EXAM: RIGHT RIBS - 2 VIEW  COMPARISON:  None.  FINDINGS: No fracture or other bone lesions are seen involving the ribs.  IMPRESSION: No acute abnormality noted.   Electronically Signed   By: Alcide Clever M.D.   On: 06/08/2015 17:02   Dg Shoulder Right  06/08/2015   CLINICAL DATA:  69 year old male with acute onset right shoulder pain with heavy lifting. Initial encounter.  EXAM: RIGHT SHOULDER - 2+ VIEW  COMPARISON:  [facility] Portable chest radiograph 01/03/2013  FINDINGS: Two views of the right shoulder. No glenohumeral joint dislocation. Proximal right humerus intact. Stable smooth exostosis at the midshaft which might be sequelae of remote trauma or related to muscle insertion. No right clavicle or scapula fracture. Visible right ribs and lung parenchyma within normal limits.  IMPRESSION: No acute osseous abnormality identified at the right shoulder.   Electronically Signed   By: Odessa Fleming M.D.   On: 06/08/2015 17:02   Ct Head Wo Contrast  06/08/2015   CLINICAL DATA:  Confusion.  EXAM: CT HEAD WITHOUT CONTRAST  TECHNIQUE: Contiguous axial images were obtained from the base of the skull through the vertex without intravenous contrast.  COMPARISON:  None.  FINDINGS: No acute intracranial abnormality. Specifically, no hemorrhage, hydrocephalus, mass lesion, acute infarction, or significant intracranial injury. No acute calvarial abnormality. Mild volume loss, compatible with  patient's age. Visualized paranasal sinuses and mastoids clear. Orbital soft tissues unremarkable.  IMPRESSION: No acute intracranial abnormality.   Electronically Signed   By: Charlett Nose M.D.   On: 06/08/2015 17:10   Mr Brain Wo Contrast  06/10/2015   CLINICAL DATA:  Progressive confusion, weakness with shift wing gait, and fever.  EXAM: MRI HEAD WITHOUT CONTRAST  TECHNIQUE: Multiplanar, multiecho pulse sequences of the brain and surrounding structures were obtained without intravenous contrast.  COMPARISON:  Head CT 06/08/2015  FINDINGS: There are small foci of abnormal hyperintense diffusion weighted signal measuring 6 mm in right frontal cortex and 4 mm in right parietal cortex with normal to slightly restricted diffusion on the ADC map compatible with acute to early subacute infarcts. There is a small cortical and subcortical infarct in the posterior right frontal lobe which appears subacute to chronic with some T2 shine through on diffusion-weighted imaging. A small amount of chronic blood products are noted associated with this infarct, and there is also a small  amount of microhemorrhage more posteriorly in the right frontal lobe, also likely related to prior ischemia.  There is no evidence of mass, midline shift, or extra-axial fluid collection. There is mild cerebral atrophy. Patchy T2 hyperintensities in the periventricular greater than subcortical cerebral white matter bilaterally and in the pons are nonspecific but compatible with mild chronic small vessel ischemic disease. Small, chronic infarcts are noted in the left cerebellar hemisphere and superior vermis.  Orbits are unremarkable. Paranasal sinuses and left mastoid air cells are clear. There is a small right mastoid effusion. Major intracranial vascular flow voids are preserved.  IMPRESSION: 1. Punctate, acute to early subacute right frontal and right parietal cortical infarcts. 2. Small, subacute to chronic posterior right frontal  cortical/subcortical infarct. 3. Chronic cerebellar infarcts. 4. 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   Electronically Signed   By: Sebastian AcheAllen  Grady   On: 06/10/2015 14:42   Mr Lumbar Spine Wo Contrast  06/11/2015   CLINICAL DATA:  69 year old diabetic hypertensive male with bacteremia. Generalize weakness. Subsequent encounter.  EXAM: MRI LUMBAR SPINE WITHOUT CONTRAST  TECHNIQUE: Multiplanar, multisequence MR imaging of the lumbar spine was performed. No intravenous contrast was administered.  COMPARISON:  No comparison lumbar examination. MR of the shoulder performed the same date and dictated separately. 06/10/2015 MR brain.  FINDINGS: Exam is motion degraded.  Fluid within the L3-4 and L4-5 facet joints typically related to degenerative changes. Septic arthritis cannot be excluded given history of bacteremia.  Mild edema along the psoas muscles and posterior aspect of the iliacus muscles can be seen with third spacing of fluid, spread of infection cannot be excluded.  Fluid like signal intensity anterior aspect of the L5-S1 disc without secondary findings of discitis.  T12-L1:  Negative.  L1-2:  Mild facet joint degenerative changes.  Minimal bulge.  L2-3:  Mild facet joint degenerative changes.  Minimal bulge.  L3-4: Facet joint degenerative changes containing fluid as noted above. Mild bulge. Very mild spinal stenosis and very mild bilateral foraminal narrowing.  L4-5: Facet joint degenerative changes containing fluid as noted above. Minimal anterior slip L4. Bulge. Mild bilateral foraminal narrowing. Mild bilateral lateral recess stenosis. Very mild thecal sac narrowing.  L5-S1: Small Schmorl's node deformity. Mild facet joint degenerative changes. Mild bulge. Very mild foraminal narrowing.  IMPRESSION: Exam is motion degraded.  Fluid within the L3-4 and L4-5 facet joints typically related to degenerative changes. Septic arthritis cannot be excluded given history of  bacteremia.  Mild edema along the psoas muscles and posterior aspect of the iliacus muscles can be seen with third spacing of fluid, spread of infection cannot be excluded.  Fluid like signal intensity anterior aspect of the L5-S1 disc without secondary findings of discitis.  If there were a high clinical concern of infection involving the lumbar spine than close follow-up imaging may be considered.  Mild degenerative changes most notable L4-5 as detailed above.  Decreased signal intensity of bone marrow may be related to patient's habitus. Correlation with CBC to exclude anemia contributing to this appearance may be considered   Electronically Signed   By: Lacy DuverneySteven  Olson M.D.   On: 06/11/2015 18:23   Mr Shoulder Right Wo Contrast  06/12/2015   CLINICAL DATA:  RIGHT shoulder pain. Initial encounter. Bacteremia. Evaluate source of bacteremia.  EXAM: MRI OF THE RIGHT SHOULDER WITHOUT CONTRAST  TECHNIQUE: Multiplanar, multisequence MR imaging of the shoulder was performed. No intravenous contrast was administered.  COMPARISON:  Radiographs 06/08/2015.  Chest  CT 01/03/2013.  FINDINGS: Study is degraded by motion artifact. The patient was unable the comply with scanning and remaining motionless. Technical parameters were modified which produces artifact however these modifications do produce a diagnostic study.  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  Muscles: Mild intramuscular edema, with more prominent edema along the interstitial spaces of the rotator cuff musculature. This is probably a reflection of volume status and anasarca.  Biceps long head:  Normal.  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 Subacromial bursal fluid is present however this probably is a reflection of volume status rather than bursitis.  Glenohumeral Joint: No effusion or synovitis. No evidence of septic arthritis.  Labrum:  Grossly normal.  Bones: Cystic lesion is present in the distal  RIGHT clavicle. This was evident on prior plain films in retrospect. The lesion measures 17 mm long axis and the appearance is nonspecific. There are geographic well defined sclerotic margins in the appearance is nonaggressive. 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 Osteomyelitis is unlikely and the area appears chronic. Significantly, no periosteal reaction. Furthermore, this area was visible on prior chest CT from 01/03/2013 and appears unchanged.  IMPRESSION: 1. Motion degraded study. Technical parameters were modified to produce a diagnostic study. 2. Diffuse edema more prominent in the interstitial spaces than in the rotator cuff musculature, likely reflection of volume status/anasarca. 3. Normal glenohumeral joint. No evidence of septic arthritis at the [facility] joint or glenohumeral joint. 4. 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   Electronically Signed   By: Andreas Newport M.D.   On: 06/12/2015 08:14   Dg Chest Port 1 View  06/09/2015   CLINICAL DATA:  Weakness.  EXAM: PORTABLE CHEST - 1 VIEW  COMPARISON:  06/08/2015  FINDINGS: Normal heart size. No pleural effusion identified. Lung bases stress that mild bibasilar atelectasis noted. No airspace consolidation.  IMPRESSION: 1. Bibasilar atelectasis noted.   Electronically Signed   By: Signa Kell M.D.   On: 06/09/2015 13:31   Carotid Doppler  There is 1-39% bilateral ICA stenosis. Vertebral artery flow is antegrade.     PHYSICAL EXAM Pleasant elderly Caucasian male not in distress. . Afebrile. Head is nontraumatic. Neck is supple without bruit.    Cardiac exam no murmur or gallop. Lungs are clear to auscultation. Distal pulses are well felt. Neurological Exam : Awake alert oriented x 3 normal speech and language. Mild left lower face asymmetry. Tongue midline. No drift. Mild diminished fine finger movements on left. Orbits right over left upper extremity. Mild left grip weak.. Normal sensation .  Normal coordination. ASSESSMENT/PLAN Jacob White is a 69 y.o. male with history of diabetes mellitus, hypertension, and hyperlipidemia presenting with gradual onset of confusion and lethargy. Workup MRI showed stroke in the setting of infection. He did not receive IV t-PA due to unknown time last known well.   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 Small subacute R frontal cortical/subcortical infarct. Old cerebellar infarcts. Infarcts like secondary to chronic small vessel disease. ? Rule out infectious source  Resultant  Very mild left hemiparesis. Left facial.  MRI  Punctate R frontal and parietal cortical infarcts. Small subacute R frontal cortical/subcortical infarct. Old cerebellar infarcts.  Carotid Doppler  No significant stenosis   2D Echo  pending   LDL 42  HgbA1c  8.9  Lovenox 40 mg sq daily for VTE prophylaxis Diet NPO time specified  no antithrombotic prior to admission,  now on no antithrombotic. We will go ahead and add aspirin 325 mg daily  Ongoing aggressive stroke risk factor management  Therapy recommendations:  Inpatient rehabilitation. Consult in place.  Disposition:  pending   Hypertension  Home meds:   Lisinopril/hydrochlorothiazide  Stable  Hyperlipidemia  Home meds:  pravachol 40, resumed in hospital. Also omega-3  LDL 42, goal < 70  Continue statin at discharge  Diabetes  HgbA1c pending , goal < 7.0  Other Stroke Risk Factors  Advanced age  Other Active Problems  MRSA bacteremia. On  Antithrombotic  Reason right shoulder injury  Generalized weakness  Acute kidney injury due to dehydration  Hypokalemia  Hospital day # 4  Wynne Rozak  [facility] See Amion for Pager information 06/12/2015 12:45 PM  I have personally examined this patient, reviewed notes, independently viewed imaging studies, participated in medical decision making and plan of care. I have made any additions or  clarifications directly to the above note. Agree with checking TEE for endocarditis given positive blood cultures and tiny possibly embolic cerebellar infarcts..Continue aspirin,. D/W family at bedside. Delia Heady, MD Medical Director [facility] Pager: [phone] 06/12/2015 12:45 PM    To contact Stroke Continuity provider, please refer to [email or web address]. After hours, contact General Neurology

## 2015-06-12 NOTE — Progress Notes (Signed)
Rehab admissions - I spoke with Dr. Benjamine MolaVann.  Patient not medically ready today for inpatient rehab admission per MD.  I will await medical readiness and then try to get patient into inpatient rehab.  Call me for questions.  #[phone]

## 2015-06-12 NOTE — Progress Notes (Signed)
    [facility] has been requested to perform a transesophageal echocardiogram on 06/12/15 for stroke.  After careful review of history and examination, the risks and benefits of transesophageal echocardiogram have been explained including risks of esophageal damage, perforation (1:10,000 risk), bleeding, pharyngeal hematoma as well as other potential complications associated with conscious sedation including aspiration, arrhythmia, respiratory failure and death. Alternatives to treatment were discussed, questions were answered. Patient is willing to proceed.   Jacob White,Nikole Swartzentruber K, PA 06/12/2015 9:01 AM

## 2015-06-12 NOTE — Progress Notes (Signed)
SLP Cancellation Note  Patient Details Name: Jacob White MRN: [id number] DOB: 03/07/1946   Cancelled treatment:       Reason Eval/Treat Not Completed: Patient at procedure or test/unavailable. Pt NPO for TEE to be completed later today. Will f/u as time allows.   Metro Kungleksiak, Brealynn Contino K, MA, CCC-SLP 06/12/2015, 9:05 AM  [phone]

## 2015-06-12 NOTE — Progress Notes (Signed)
Echocardiogram Echocardiogram Transesophageal has been performed.  Nolon RodBrown, Tony 06/12/2015, 3:51 PM

## 2015-06-12 NOTE — Progress Notes (Signed)
Rehab admissions - Evaluated for possible admission.  I met with patient, wife and son at the bedside.  All are in agreement to inpatient rehab.  Noted patient to have TEE this pm.  Once TEE completed and no further test, procedures are anticipated, then can admit to acute inpatient rehab.  We will have beds available this pm if patient medically ready later today.  Call me for questions.  #[phone]

## 2015-06-12 NOTE — Progress Notes (Signed)
PT Cancellation Note  Patient Details Name: Jacob White MRN: [id number] DOB: 05/22/1946   Cancelled Treatment:    Reason Eval/Treat Not Completed: Patient at procedure or test/unavailable   [name] 06/12/2015, 2:23 PM  [facility] [phone]

## 2015-06-12 NOTE — Progress Notes (Signed)
[place] from [place]. RN states that patient is not having periods of apnea anymore and vital signs stable.

## 2015-06-12 NOTE — Evaluation (Signed)
Occupational Therapy Evaluation and Discharge Patient Details Name: Jacob White MRN: [id number] DOB: 04/17/1946 Today's Date: 06/12/2015    History of Present Illness Pt adm with weakness, confusion, and RUE pain.  MRI + for rt frontal and parietal stroke. Pt also with MRSA bacteremia. PMH - DM, HTN   Clinical Impression   This 69 yo male admitted with above presents to acute OT with decreased balance, decreased mobility, decreased safety awareness all affecting his ability to care for himself at an Independent level as he was pta. He will benefit from continued OT on CIR. We will D/C from acute OT with pt being admitted to CIR today (as was found out at the end of the eval).    Follow Up Recommendations  CIR    Equipment Recommendations   (TBD on CIR)       Precautions / Restrictions Precautions Precautions: Fall Restrictions Weight Bearing Restrictions: No      Mobility Bed Mobility Overal bed mobility: Needs Assistance Bed Mobility: Supine to Sit;Sit to Supine     Supine to sit: Min assist;HOB elevated Sit to supine: Min guard   General bed mobility comments: Assist to bring trunk up. Pt with posterior lean.  Transfers Overall transfer level: Needs assistance Equipment used: 1 person hand held assist Transfers: Sit to/from Stand Sit to Stand: Min assist         General transfer comment: increased time and cues to correct posterior lean    Balance Overall balance assessment: Needs assistance Sitting-balance support: Feet supported;Bilateral upper extremity supported Sitting balance-Leahy Scale: Fair Sitting balance - Comments: posterior lean with VCs to reach forward below knees to correct lean   Standing balance support: Bilateral upper extremity supported Standing balance-Leahy Scale: Poor Standing balance comment: min A for posterior lean with increased time and cues to correct                            ADL Overall ADL's : Needs  assistance/impaired Eating/Feeding: Independent (supported sitting)   Grooming: Set up (supported sitting)   Upper Body Bathing: Set up (supported sitting)   Lower Body Bathing: Moderate assistance (with min A sit<>stand and to maintain standing)   Upper Body Dressing : Minimal assistance (supported sitting)   Lower Body Dressing: Maximal assistance (with min A sit<>stand and to maintain standing)   Toilet Transfer: Minimal assistance;Stand-pivot;BSC   Toileting- Clothing Manipulation and Hygiene: Moderate assistance (with min A sit<>stand and to maintain standing)               Vision Additional Comments: No change from baseline--does NOT wear glasses          Pertinent Vitals/Pain Pain Assessment: No/denies pain     Hand Dominance  Right   Extremity/Trunk Assessment Upper Extremity Assessment Upper Extremity Assessment: RUE deficits/detail RUE Deficits / Details: Full AROM, decreased strength 3+/5 shoulder (per son pt has rotator cuff issues on this side, rest 4/5           Communication  No issues   Cognition Arousal/Alertness: Awake/alert Behavior During Therapy: WFL for tasks assessed/performed Overall Cognitive Status: Within Functional Limits for tasks assessed                                Home Living Family/patient expects to be discharged to:: Inpatient rehab  OT Diagnosis: Generalized weakness   OT Problem List: Decreased strength;Impaired balance (sitting and/or standing);Decreased safety awareness;Impaired UE functional use;Decreased knowledge of use of DME or AE      OT Goals(Current goals can be found in the care plan section) Acute Rehab OT Goals Patient Stated Goal: to rehab then home  OT Frequency:                End of Session Equipment Utilized During Treatment: Gait belt  Activity Tolerance: Patient tolerated treatment well Patient left: in bed;with  bed alarm set   Time: 1610-9604 OT Time Calculation (min): 18 min Charges:  OT General Charges $OT Visit: 1 Procedure OT Evaluation $Initial OT Evaluation Tier I: 1 Procedure  Jacob White [phone] 06/12/2015, 9:48 AM

## 2015-06-12 NOTE — Progress Notes (Signed)
Patient complaining of "irritation of my vein" following infusion of potassium. Dr. Benjamine MolaVann made aware that patient will have received 2 runs of K. Verbal order to hold additional IV doses and she will switch patient to oral K.

## 2015-06-13 ENCOUNTER — Encounter (HOSPITAL_COMMUNITY): Payer: Self-pay | Admitting: Internal Medicine

## 2015-06-13 ENCOUNTER — Inpatient Hospital Stay (HOSPITAL_COMMUNITY)
Admission: AD | Admit: 2015-06-13 | Discharge: 2015-06-20 | DRG: 057 | Disposition: A | Discharge status: Home Health | Payer: Medicare Other | Source: Intra-hospital | Attending: Physical Medicine & Rehabilitation | Admitting: Physical Medicine & Rehabilitation

## 2015-06-13 ENCOUNTER — Inpatient Hospital Stay (HOSPITAL_COMMUNITY): Payer: Medicare Other

## 2015-06-13 DIAGNOSIS — E1165 Type 2 diabetes mellitus with hyperglycemia: Secondary | ICD-10-CM | POA: Diagnosis not present

## 2015-06-13 DIAGNOSIS — G8190 Hemiplegia, unspecified affecting unspecified side: Secondary | ICD-10-CM | POA: Diagnosis not present

## 2015-06-13 DIAGNOSIS — E1142 Type 2 diabetes mellitus with diabetic polyneuropathy: Secondary | ICD-10-CM | POA: Diagnosis not present

## 2015-06-13 DIAGNOSIS — Z79899 Other long term (current) drug therapy: Secondary | ICD-10-CM

## 2015-06-13 DIAGNOSIS — I69354 Hemiplegia and hemiparesis following cerebral infarction affecting left non-dominant side: Secondary | ICD-10-CM | POA: Diagnosis present

## 2015-06-13 DIAGNOSIS — I10 Essential (primary) hypertension: Secondary | ICD-10-CM | POA: Diagnosis not present

## 2015-06-13 DIAGNOSIS — E871 Hypo-osmolality and hyponatremia: Secondary | ICD-10-CM

## 2015-06-13 DIAGNOSIS — E876 Hypokalemia: Secondary | ICD-10-CM

## 2015-06-13 DIAGNOSIS — T502X5A Adverse effect of carbonic-anhydrase inhibitors, benzothiadiazides and other diuretics, initial encounter: Secondary | ICD-10-CM | POA: Diagnosis not present

## 2015-06-13 DIAGNOSIS — E119 Type 2 diabetes mellitus without complications: Secondary | ICD-10-CM

## 2015-06-13 DIAGNOSIS — B9562 Methicillin resistant Staphylococcus aureus infection as the cause of diseases classified elsewhere: Secondary | ICD-10-CM

## 2015-06-13 DIAGNOSIS — I6330 Cerebral infarction due to thrombosis of unspecified cerebral artery: Secondary | ICD-10-CM

## 2015-06-13 DIAGNOSIS — R7881 Bacteremia: Secondary | ICD-10-CM | POA: Diagnosis present

## 2015-06-13 DIAGNOSIS — R7989 Other specified abnormal findings of blood chemistry: Secondary | ICD-10-CM | POA: Diagnosis not present

## 2015-06-13 DIAGNOSIS — G8194 Hemiplegia, unspecified affecting left nondominant side: Secondary | ICD-10-CM | POA: Insufficient documentation

## 2015-06-13 DIAGNOSIS — IMO0002 Reserved for concepts with insufficient information to code with codable children: Secondary | ICD-10-CM | POA: Diagnosis present

## 2015-06-13 DIAGNOSIS — I639 Cerebral infarction, unspecified: Secondary | ICD-10-CM

## 2015-06-13 LAB — BASIC METABOLIC PANEL
ANION GAP: 14 (ref 5–15)
BUN: 15 mg/dL (ref 6–20)
CALCIUM: 7.7 mg/dL — AB (ref 8.9–10.3)
CHLORIDE: 89 mmol/L — AB (ref 101–111)
CO2: 26 mmol/L (ref 22–32)
CREATININE: 1 mg/dL (ref 0.61–1.24)
GFR calc Af Amer: >60 mL/min (ref >60)
GFR calc non Af Amer: >60 mL/min (ref >60)
GLUCOSE: 176 mg/dL — AB (ref 65–99)
POTASSIUM: 3.2 mmol/L — AB (ref 3.5–5.1)
SODIUM: 129 mmol/L — AB (ref 135–145)

## 2015-06-13 LAB — GLUCOSE, CAPILLARY
GLUCOSE-CAPILLARY: 244 mg/dL — AB (ref 65–99)
GLUCOSE-CAPILLARY: 338 mg/dL — AB (ref 65–99)
Glucose-Capillary: 149 mg/dL — ABNORMAL HIGH (ref 65–99)
Glucose-Capillary: 255 mg/dL — ABNORMAL HIGH (ref 65–99)

## 2015-06-13 LAB — MAGNESIUM: Magnesium: 1.8 mg/dL (ref 1.7–2.4)

## 2015-06-13 MED ORDER — ONDANSETRON HCL 4 MG PO TABS: 4.0000 mg | ORAL_TABLET | Freq: Four times a day (QID) | ORAL | Status: DC | PRN

## 2015-06-13 MED ORDER — SENNOSIDES-DOCUSATE SODIUM 8.6-50 MG PO TABS: 1.0000 | ORAL_TABLET | Freq: Every evening | ORAL | Status: DC | PRN

## 2015-06-13 MED ORDER — ASPIRIN EC 325 MG PO TBEC
325.0000 mg | DELAYED_RELEASE_TABLET | Freq: Every day | ORAL | Status: DC
  Administered 2015-06-14 – 2015-06-20 (×7): 325 mg via ORAL
  Filled 2015-06-13 (×9): qty 1

## 2015-06-13 MED ORDER — FLEET ENEMA 7-19 GM/118ML RE ENEM: 1.0000 | ENEMA | Freq: Once | RECTAL | Status: AC | PRN

## 2015-06-13 MED ORDER — PRAVASTATIN SODIUM 40 MG PO TABS
40.0000 mg | ORAL_TABLET | Freq: Every day | ORAL | Status: DC
  Administered 2015-06-13 – 2015-06-19 (×7): 40 mg via ORAL
  Filled 2015-06-13 (×8): qty 1

## 2015-06-13 MED ORDER — TRAZODONE HCL 50 MG PO TABS
25.0000 mg | ORAL_TABLET | Freq: Every evening | ORAL | Status: DC | PRN
  Administered 2015-06-13: 50 mg via ORAL
  Filled 2015-06-13: qty 1

## 2015-06-13 MED ORDER — ENSURE ENLIVE PO LIQD
237.0000 mL | Freq: Two times a day (BID) | ORAL | Status: DC
  Administered 2015-06-14 (×2): 237 mL via ORAL

## 2015-06-13 MED ORDER — INSULIN ASPART 100 UNIT/ML ~~LOC~~ SOLN
0.0000 [IU] | Freq: Every day | SUBCUTANEOUS | Status: DC
  Administered 2015-06-13: 3 [IU] via SUBCUTANEOUS
  Administered 2015-06-17: 2 [IU] via SUBCUTANEOUS

## 2015-06-13 MED ORDER — INSULIN GLARGINE 100 UNIT/ML ~~LOC~~ SOLN
12.0000 [IU] | Freq: Every day | SUBCUTANEOUS | Status: DC
  Filled 2015-06-13: qty 0.12

## 2015-06-13 MED ORDER — GUAIFENESIN-DM 100-10 MG/5ML PO SYRP: 5.0000 mL | ORAL_SOLUTION | Freq: Four times a day (QID) | ORAL | Status: DC | PRN

## 2015-06-13 MED ORDER — TRAMADOL HCL 50 MG PO TABS: 50.0000 mg | ORAL_TABLET | Freq: Four times a day (QID) | ORAL | Status: DC | PRN

## 2015-06-13 MED ORDER — INSULIN ASPART 100 UNIT/ML ~~LOC~~ SOLN
0.0000 [IU] | Freq: Three times a day (TID) | SUBCUTANEOUS | Status: DC
Start: 2015-06-13 — End: 2015-06-20
  Administered 2015-06-13: 11 [IU] via SUBCUTANEOUS
  Administered 2015-06-14: 3 [IU] via SUBCUTANEOUS
  Administered 2015-06-14: 5 [IU] via SUBCUTANEOUS
  Administered 2015-06-14: 8 [IU] via SUBCUTANEOUS
  Administered 2015-06-15: 3 [IU] via SUBCUTANEOUS
  Administered 2015-06-15 – 2015-06-16 (×3): 5 [IU] via SUBCUTANEOUS
  Administered 2015-06-16 – 2015-06-17 (×3): 3 [IU] via SUBCUTANEOUS
  Administered 2015-06-17: 5 [IU] via SUBCUTANEOUS
  Administered 2015-06-17: 3 [IU] via SUBCUTANEOUS
  Administered 2015-06-18: 5 [IU] via SUBCUTANEOUS
  Administered 2015-06-18 – 2015-06-19 (×3): 3 [IU] via SUBCUTANEOUS
  Administered 2015-06-20: 2 [IU] via SUBCUTANEOUS

## 2015-06-13 MED ORDER — ONDANSETRON HCL 4 MG/2ML IJ SOLN: 4.0000 mg | Freq: Four times a day (QID) | INTRAMUSCULAR | Status: DC | PRN

## 2015-06-13 MED ORDER — LISINOPRIL 20 MG PO TABS
20.0000 mg | ORAL_TABLET | Freq: Every day | ORAL | Status: DC
  Filled 2015-06-13 (×2): qty 1

## 2015-06-13 MED ORDER — INSULIN GLARGINE 100 UNIT/ML ~~LOC~~ SOLN: 12.0000 [IU] | Freq: Every day | SUBCUTANEOUS | Status: DC

## 2015-06-13 MED ORDER — BISACODYL 10 MG RE SUPP: 10.0000 mg | Freq: Every day | RECTAL | Status: DC | PRN

## 2015-06-13 MED ORDER — MUPIROCIN 2 % EX OINT
1.0000 | TOPICAL_OINTMENT | Freq: Two times a day (BID) | CUTANEOUS | Status: AC
Start: 2015-06-13 — End: 2015-06-17
  Administered 2015-06-13 – 2015-06-16 (×6): 1 via NASAL
  Filled 2015-06-13: qty 22

## 2015-06-13 MED ORDER — VANCOMYCIN HCL IN DEXTROSE 750-5 MG/150ML-% IV SOLN
750.0000 mg | Freq: Two times a day (BID) | INTRAVENOUS | Status: DC
  Administered 2015-06-14 – 2015-06-20 (×13): 750 mg via INTRAVENOUS
  Filled 2015-06-13 (×15): qty 150

## 2015-06-13 MED ORDER — POTASSIUM CHLORIDE CRYS ER 20 MEQ PO TBCR
40.0000 meq | EXTENDED_RELEASE_TABLET | Freq: Once | ORAL | Status: AC
  Administered 2015-06-13: 40 meq via ORAL
  Filled 2015-06-13: qty 2

## 2015-06-13 MED ORDER — ALUM & MAG HYDROXIDE-SIMETH 200-200-20 MG/5ML PO SUSP: 30.0000 mL | Freq: Four times a day (QID) | ORAL | Status: DC | PRN

## 2015-06-13 MED ORDER — DIPHENHYDRAMINE HCL 12.5 MG/5ML PO ELIX: 12.5000 mg | ORAL_SOLUTION | Freq: Four times a day (QID) | ORAL | Status: DC | PRN

## 2015-06-13 MED ORDER — METHOCARBAMOL 500 MG PO TABS: 500.0000 mg | ORAL_TABLET | Freq: Four times a day (QID) | ORAL | Status: DC | PRN

## 2015-06-13 MED ORDER — MAGNESIUM SULFATE 2 GM/50ML IV SOLN
2.0000 g | Freq: Once | INTRAVENOUS | Status: AC
  Administered 2015-06-13: 2 g via INTRAVENOUS
  Filled 2015-06-13: qty 50

## 2015-06-13 MED ORDER — ACETAMINOPHEN 325 MG PO TABS: 325.0000 mg | ORAL_TABLET | ORAL | Status: DC | PRN

## 2015-06-13 MED ORDER — CHLORHEXIDINE GLUCONATE CLOTH 2 % EX PADS
6.0000 | MEDICATED_PAD | Freq: Every day | CUTANEOUS | Status: AC
Start: 2015-06-14 — End: 2015-06-18
  Administered 2015-06-14 – 2015-06-17 (×3): 6 via TOPICAL
  Filled 2015-06-13: qty 6

## 2015-06-13 MED ORDER — POTASSIUM CHLORIDE CRYS ER 20 MEQ PO TBCR
20.0000 meq | EXTENDED_RELEASE_TABLET | Freq: Two times a day (BID) | ORAL | Status: AC
  Administered 2015-06-13 – 2015-06-15 (×4): 20 meq via ORAL
  Filled 2015-06-13 (×4): qty 1

## 2015-06-13 MED ORDER — ENOXAPARIN SODIUM 40 MG/0.4ML ~~LOC~~ SOLN
40.0000 mg | SUBCUTANEOUS | Status: DC
  Administered 2015-06-13 – 2015-06-19 (×7): 40 mg via SUBCUTANEOUS
  Filled 2015-06-13 (×8): qty 0.4

## 2015-06-13 NOTE — Progress Notes (Signed)
[facility]  Date of Admission:  06/08/2015  Antibiotics: vancomycin  Subjective: Feels much better  Objective: Temp:  [98.6 F (37 C)-99.4 F (37.4 C)] 98.9 F (37.2 C) (06/29 0530) Pulse Rate:  [61-100] 93 (06/29 1047) Resp:  [11-27] 18 (06/29 0530) BP: (110-145)/(69-97) 112/72 mmHg (06/29 1047) SpO2:  [91 %-99 %] 99 % (06/29 1047)  General: awake, in bed, nad Skin: no rashes Lungs: CTA B Cor: RRR without m Abdomen: soft, nt, nd Ext: no edema  Lab Results Lab Results  Component Value Date   WBC 9.5 06/12/2015   HGB 13.1 06/12/2015   HCT 37.7* 06/12/2015   MCV 84.2 06/12/2015   PLT 145* 06/12/2015    Lab Results  Component Value Date   CREATININE 1.00 06/13/2015   BUN 15 06/13/2015   NA 129* 06/13/2015   K 3.2* 06/13/2015   CL 89* 06/13/2015   CO2 26 06/13/2015    Lab Results  Component Value Date   ALT 19 06/08/2015   AST 21 06/08/2015   ALKPHOS 107 06/08/2015   BILITOT 0.8 06/08/2015      Microbiology: Recent Results (from the past 240 hour(s))  Blood culture (routine x 2)     Status: None   Collection Time: 06/08/15  8:10 PM  Result Value Ref Range Status   Specimen Description BLOOD RIGHT HAND  Final   Special Requests BOTTLES DRAWN AEROBIC AND ANAEROBIC 5CC  Final   Culture  Setup Time   Final    GRAM POSITIVE COCCI IN PAIRS IN BOTH AEROBIC AND ANAEROBIC BOTTLES CRITICAL RESULT CALLED TO, READ BACK BY AND VERIFIED WITH: AROYAL,RN AT 1053 06/09/15 BY LBENFIELD    Culture METHICILLIN RESISTANT STAPHYLOCOCCUS AUREUS  Final   Report Status 06/11/2015 FINAL  Final   Organism ID, Bacteria METHICILLIN RESISTANT STAPHYLOCOCCUS AUREUS  Final      Susceptibility   Methicillin resistant staphylococcus aureus - MIC*    CIPROFLOXACIN >=8 RESISTANT Resistant     ERYTHROMYCIN <=0.25 SENSITIVE Sensitive     GENTAMICIN <=0.5 SENSITIVE Sensitive     OXACILLIN >=4 RESISTANT Resistant     TETRACYCLINE <=1 SENSITIVE Sensitive     VANCOMYCIN <=0.5 SENSITIVE Sensitive     TRIMETH/SULFA <=10 SENSITIVE Sensitive     CLINDAMYCIN <=0.25 SENSITIVE Sensitive     RIFAMPIN <=0.5 SENSITIVE Sensitive     Inducible Clindamycin NEGATIVE Sensitive     * METHICILLIN RESISTANT STAPHYLOCOCCUS AUREUS  Blood culture (routine x 2)     Status: None   Collection Time: 06/08/15  8:15 PM  Result Value Ref Range Status   Specimen Description BLOOD LEFT HAND  Final   Special Requests BOTTLES DRAWN AEROBIC AND ANAEROBIC 5CC  Final   Culture  Setup Time   Final    GRAM POSITIVE COCCI IN CLUSTERS IN BOTH AEROBIC AND ANAEROBIC BOTTLES CRITICAL RESULT CALLED TO, READ BACK BY AND VERIFIED WITH: AROYAL,RN AT 1153 06/09/15 BY LBENFIELD    Culture   Final    METHICILLIN RESISTANT STAPHYLOCOCCUS AUREUS SUSCEPTIBILITIES PERFORMED ON PREVIOUS CULTURE WITHIN THE LAST 5 DAYS.    Report Status 06/11/2015 FINAL  Final  Culture, blood (routine x 2)     Status: None (Preliminary result)   Collection Time: 06/11/15  4:50 AM  Result Value Ref Range Status   Specimen Description BLOOD RIGHT ARM  Final   Special Requests BOTTLES DRAWN AEROBIC AND ANAEROBIC 8CC 8CC  Final   Culture NO GROWTH 1 DAY  Final  Report Status PENDING  Incomplete  Culture, blood (routine x 2)     Status: None (Preliminary result)   Collection Time: 06/11/15  5:08 AM  Result Value Ref Range Status   Specimen Description BLOOD RIGHT ARM  Final   Special Requests BOTTLES DRAWN AEROBIC ONLY 7CC  Final   Culture NO GROWTH 1 DAY  Final   Report Status PENDING  Incomplete  Urine culture     Status: None   Collection Time: 06/11/15 11:04 AM  Result Value Ref Range Status   Specimen Description URINE, RANDOM  Final   Special Requests NONE  Final   Culture   Final    MULTIPLE SPECIES PRESENT, SUGGEST RECOLLECTION IF CLINICALLY INDICATED   Report Status 06/12/2015 FINAL  Final  MRSA PCR Screening     Status: Abnormal   Collection Time: 06/12/15  8:37 AM  Result Value Ref Range  Status   MRSA by PCR POSITIVE (A) NEGATIVE Final    Comment:        The GeneXpert MRSA Assay (FDA approved for NASAL specimens only), is one component of a comprehensive MRSA colonization surveillance program. It is not intended to diagnose MRSA infection nor to guide or monitor treatment for MRSA infections.     Studies/Results: Mr Lumbar Spine Wo Contrast  06/11/2015   CLINICAL DATA:  69 year old diabetic hypertensive male with bacteremia. Generalize weakness. Subsequent encounter.  EXAM: MRI LUMBAR SPINE WITHOUT CONTRAST  TECHNIQUE: Multiplanar, multisequence MR imaging of the lumbar spine was performed. No intravenous contrast was administered.  COMPARISON:  No comparison lumbar examination. MR of the shoulder performed the same date and dictated separately. 06/10/2015 MR brain.  FINDINGS: Exam is motion degraded.  Fluid within the L3-4 and L4-5 facet joints typically related to degenerative changes. Septic arthritis cannot be excluded given history of bacteremia.  Mild edema along the psoas muscles and posterior aspect of the iliacus muscles can be seen with third spacing of fluid, spread of infection cannot be excluded.  Fluid like signal intensity anterior aspect of the L5-S1 disc without secondary findings of discitis.  T12-L1:  Negative.  L1-2:  Mild facet joint degenerative changes.  Minimal bulge.  L2-3:  Mild facet joint degenerative changes.  Minimal bulge.  L3-4: Facet joint degenerative changes containing fluid as noted above. Mild bulge. Very mild spinal stenosis and very mild bilateral foraminal narrowing.  L4-5: Facet joint degenerative changes containing fluid as noted above. Minimal anterior slip L4. Bulge. Mild bilateral foraminal narrowing. Mild bilateral lateral recess stenosis. Very mild thecal sac narrowing.  L5-S1: Small Schmorl's node deformity. Mild facet joint degenerative changes. Mild bulge. Very mild foraminal narrowing.  IMPRESSION: Exam is motion degraded.  Fluid  within the L3-4 and L4-5 facet joints typically related to degenerative changes. Septic arthritis cannot be excluded given history of bacteremia.  Mild edema along the psoas muscles and posterior aspect of the iliacus muscles can be seen with third spacing of fluid, spread of infection cannot be excluded.  Fluid like signal intensity anterior aspect of the L5-S1 disc without secondary findings of discitis.  If there were a high clinical concern of infection involving the lumbar spine than close follow-up imaging may be considered.  Mild degenerative changes most notable L4-5 as detailed above.  Decreased signal intensity of bone marrow may be related to patient's habitus. Correlation with CBC to exclude anemia contributing to this appearance may be considered   Electronically Signed   By: Lacy DuverneySteven  Olson M.D.   On: 06/11/2015  18:23   Mr Shoulder Right Wo Contrast  06/12/2015   CLINICAL DATA:  RIGHT shoulder pain. Initial encounter. Bacteremia. Evaluate source of bacteremia.  EXAM: MRI OF THE RIGHT SHOULDER WITHOUT CONTRAST  TECHNIQUE: Multiplanar, multisequence MR imaging of the shoulder was performed. No intravenous contrast was administered.  COMPARISON:  Radiographs 06/08/2015.  Chest CT 01/03/2013.  FINDINGS: Study is degraded by motion artifact. The patient was unable the comply with scanning and remaining motionless. Technical parameters were modified which produces artifact however these modifications do produce a diagnostic study.  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  Muscles: Mild intramuscular edema, with more prominent edema along the interstitial spaces of the rotator cuff musculature. This is probably a reflection of volume status and anasarca.  Biceps long head:  Normal.  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 Subacromial bursal fluid is present however this probably is a reflection of volume status rather than bursitis.  Glenohumeral Joint:  No effusion or synovitis. No evidence of septic arthritis.  Labrum:  Grossly normal.  Bones: Cystic lesion is present in the distal RIGHT clavicle. This was evident on prior plain films in retrospect. The lesion measures 17 mm long axis and the appearance is nonspecific. There are geographic well defined sclerotic margins in the appearance is nonaggressive. 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 Osteomyelitis is unlikely and the area appears chronic. Significantly, no periosteal reaction. Furthermore, this area was visible on prior chest CT from 01/03/2013 and appears unchanged.  IMPRESSION: 1. Motion degraded study. Technical parameters were modified to produce a diagnostic study. 2. Diffuse edema more prominent in the interstitial spaces than in the rotator cuff musculature, likely reflection of volume status/anasarca. 3. Normal glenohumeral joint. No evidence of septic arthritis at the [facility] joint or glenohumeral joint. 4. 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   Electronically Signed   By: Andreas Newport M.D.   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  Nothing specific noted on MRI shoulder and lumbar, some fluid but non speicific. TEE not concerning for vegetation.  Repeat blood cultures ngtd.   As long as repeat blood cultures remain negative, would treat for 2 weeks from yesterday through July 11th.  Could get picc line tomorrow if needed in rehab. If picc placed, can remove July 11th.   Vancomycin with trough goal of 15-20 Cbc, cmp weekly x 1  I will sign off but are available for any changes  Staci Righter, MD [email or web address] [phone] pager   [phone] cell 06/13/2015, 1:58 PM

## 2015-06-13 NOTE — Progress Notes (Signed)
STROKE TEAM PROGRESS NOTE   HISTORY Jacob White is a 69 y.o. male with a history of diabetes mellitus, hypertension, and hyperlipidemia who was admitted to [facility] on 06/08/2015 after seeing his primary care physician for right shoulder pain. The patient injured his shoulder while trying to move a picnic table while sitting on his riding mower. During that visit the patient's wife reported that the patient has been very lethargic recently and has been increasingly confused over the past 3-4 weeks. She had never known the patient to be confused in the past. He is usually very active with little projects around the house but now seems to sleep all of the time.The onset of these symptoms has been gradual but he seems to be getting worse. He has had poor PO intake of both liquids and solids. He apparently has had some difficulty swallowing solid foods. The patient was admitted for further evaluation. 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 The patient denies any history of irregular heartbeats or palpitations. He was not taking aspirin prior to admission. He was not good about seeing his physician on a regular basis and he seldom complained of any health issues. He had a fever at time of admission with foul-smelling urine and blood cultures that came back positive for Staphylococcus aureus. He is currently on IV Ancef, vancomycin, and Diflucan. All of the history was obtained from the patient's wife. Unable to determine last known well. Patient was not administered TPA secondary to unknown time of onset. He was admitted for further evaluation and treatment.   SUBJECTIVE (INTERVAL HISTORY) Patients states he is doing better with improving left sided strength. No new complaints. Patient had positive blood culture for staph aureus hence TEE was   Ordered and done earlier today which  showed no evidence of vegetations but atrial septal defect was noted. Lower extremity venous Dopplers have been ordered  OBJECTIVE Temp:  [98.4 F (36.9 C)-99.4 F (37.4 C)] 98.4 F (36.9 C) (06/29 1402) Pulse Rate:  [61-100] 79 (06/29 1402) Cardiac Rhythm:  [-]  Resp:  [11-27] 18 (06/29 1402) BP: (105-145)/(69-97) 105/70 mmHg (06/29 1402) SpO2:  [90 %-99 %] 90 % (06/29 1402)   Recent Labs Lab 06/12/15 1231 06/12/15 1719 06/12/15 2124 06/13/15 0815 06/13/15 1202  GLUCAP 128* 144* 199* 149* 244*    Recent Labs Lab 06/08/15 1718 06/09/15 0640 06/11/15 0629 06/12/15 0642 06/13/15 0522  NA 130* 131* 130* 129* 129*  K 3.7 3.5 2.8* 2.9* 3.2*  CL 91* 98* 95* 91* 89*  CO2 26 21* 24 29 26   GLUCOSE 240* 173* 141* 150* 176*  BUN 40* 29* 18 17 15   CREATININE 1.31* 1.10 0.99 0.96 1.00  CALCIUM 8.8* 8.0* 7.7* 7.6* 7.7*  MG  --   --  1.5*  --  1.8    Recent Labs Lab 06/08/15 1718  AST 21  ALT 19  ALKPHOS 107  BILITOT 0.8  PROT 7.4  ALBUMIN 2.8*    Recent Labs Lab 06/08/15 1718 06/11/15 0629 06/12/15 0642  WBC 9.6 7.1 9.5  NEUTROABS 8.3*  --   --   HGB 13.7 11.8* 13.1  HCT 39.3 33.9* 37.7*  MCV 84.9 83.3 84.2  PLT 184 125* 145*   No results for input(s): CKTOTAL, CKMB, CKMBINDEX, TROPONINI in the last 168 hours. No results for input(s): LABPROT, INR in the last 72 hours. No results  for input(s): COLORURINE, LABSPEC, PHURINE, GLUCOSEU, HGBUR, BILIRUBINUR, KETONESUR, PROTEINUR, UROBILINOGEN, NITRITE, LEUKOCYTESUR in the last 72 hours.  Invalid input(s): APPERANCEUR     Component Value Date/Time   CHOL 79 06/11/2015 0629   TRIG 156* 06/11/2015 0629   HDL 6* 06/11/2015 0629   CHOLHDL 13.2 06/11/2015 0629   VLDL 31 06/11/2015 0629   LDLCALC 42 06/11/2015 0629   Lab Results  Component Value Date   HGBA1C 8.9* 06/11/2015   No results found for: LABOPIA, COCAINSCRNUR, LABBENZ, AMPHETMU, THCU, LABBARB  No results for input(s): ETH in the last 168  hours.  Dg Chest 2 View  06/08/2015   CLINICAL DATA:  Patient was moving picnic table. Felt sharp pain on the right shoulder. Pain under the breast on the anterior sign. Shortness of breath. History of hypertension, diabetes, smoking.  EXAM: CHEST  2 VIEW  COMPARISON:  None.  FINDINGS: The heart size and mediastinal contours are within normal limits. Both lungs are clear. The visualized skeletal structures are unremarkable.  IMPRESSION: No active cardiopulmonary disease.   Electronically Signed   By: Norva PavlovElizabeth  Brown M.D.   On: 06/08/2015 17:02   Dg Ribs Unilateral Right  06/08/2015   CLINICAL DATA:  Right chest pain while moving picnic table, initial encounter  EXAM: RIGHT RIBS - 2 VIEW  COMPARISON:  None.  FINDINGS: No fracture or other bone lesions are seen involving the ribs.  IMPRESSION: No acute abnormality noted.   Electronically Signed   By: Alcide CleverMark  Lukens M.D.   On: 06/08/2015 17:02   Dg Shoulder Right  06/08/2015   CLINICAL DATA:  69 year old male with acute onset right shoulder pain with heavy lifting. Initial encounter.  EXAM: RIGHT SHOULDER - 2+ VIEW  COMPARISON:  [facility] Portable chest radiograph 01/03/2013  FINDINGS: Two views of the right shoulder. No glenohumeral joint dislocation. Proximal right humerus intact. Stable smooth exostosis at the midshaft which might be sequelae of remote trauma or related to muscle insertion. No right clavicle or scapula fracture. Visible right ribs and lung parenchyma within normal limits.  IMPRESSION: No acute osseous abnormality identified at the right shoulder.   Electronically Signed   By: Odessa FlemingH  Hall M.D.   On: 06/08/2015 17:02   Ct Head Wo Contrast  06/08/2015   CLINICAL DATA:  Confusion.  EXAM: CT HEAD WITHOUT CONTRAST  TECHNIQUE: Contiguous axial images were obtained from the base of the skull through the vertex without intravenous contrast.  COMPARISON:  None.  FINDINGS: No acute intracranial abnormality. Specifically, no  hemorrhage, hydrocephalus, mass lesion, acute infarction, or significant intracranial injury. No acute calvarial abnormality. Mild volume loss, compatible with patient's age. Visualized paranasal sinuses and mastoids clear. Orbital soft tissues unremarkable.  IMPRESSION: No acute intracranial abnormality.   Electronically Signed   By: Charlett NoseKevin  Dover M.D.   On: 06/08/2015 17:10   Mr Brain Wo Contrast  06/10/2015   CLINICAL DATA:  Progressive confusion, weakness with shift wing gait, and fever.  EXAM: MRI HEAD WITHOUT CONTRAST  TECHNIQUE: Multiplanar, multiecho pulse sequences of the brain and surrounding structures were obtained without intravenous contrast.  COMPARISON:  Head CT 06/08/2015  FINDINGS: There are small foci of abnormal hyperintense diffusion weighted signal measuring 6 mm in right frontal cortex and 4 mm in right parietal cortex with normal to slightly restricted diffusion on the ADC map compatible with acute to early subacute infarcts. There is a small cortical and subcortical infarct in the posterior right frontal lobe which appears subacute to  chronic with some T2 shine through on diffusion-weighted imaging. A small amount of chronic blood products are noted associated with this infarct, and there is also a small amount of microhemorrhage more posteriorly in the right frontal lobe, also likely related to prior ischemia.  There is no evidence of mass, midline shift, or extra-axial fluid collection. There is mild cerebral atrophy. Patchy T2 hyperintensities in the periventricular greater than subcortical cerebral white matter bilaterally and in the pons are nonspecific but compatible with mild chronic small vessel ischemic disease. Small, chronic infarcts are noted in the left cerebellar hemisphere and superior vermis.  Orbits are unremarkable. Paranasal sinuses and left mastoid air cells are clear. There is a small right mastoid effusion. Major intracranial vascular flow voids are preserved.   IMPRESSION: 1. Punctate, acute to early subacute right frontal and right parietal cortical infarcts. 2. Small, subacute to chronic posterior right frontal cortical/subcortical infarct. 3. Chronic cerebellar infarcts. 4. 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   Electronically Signed   By: Sebastian Ache   On: 06/10/2015 14:42   Mr Lumbar Spine Wo Contrast  06/11/2015   CLINICAL DATA:  69 year old diabetic hypertensive male with bacteremia. Generalize weakness. Subsequent encounter.  EXAM: MRI LUMBAR SPINE WITHOUT CONTRAST  TECHNIQUE: Multiplanar, multisequence MR imaging of the lumbar spine was performed. No intravenous contrast was administered.  COMPARISON:  No comparison lumbar examination. MR of the shoulder performed the same date and dictated separately. 06/10/2015 MR brain.  FINDINGS: Exam is motion degraded.  Fluid within the L3-4 and L4-5 facet joints typically related to degenerative changes. Septic arthritis cannot be excluded given history of bacteremia.  Mild edema along the psoas muscles and posterior aspect of the iliacus muscles can be seen with third spacing of fluid, spread of infection cannot be excluded.  Fluid like signal intensity anterior aspect of the L5-S1 disc without secondary findings of discitis.  T12-L1:  Negative.  L1-2:  Mild facet joint degenerative changes.  Minimal bulge.  L2-3:  Mild facet joint degenerative changes.  Minimal bulge.  L3-4: Facet joint degenerative changes containing fluid as noted above. Mild bulge. Very mild spinal stenosis and very mild bilateral foraminal narrowing.  L4-5: Facet joint degenerative changes containing fluid as noted above. Minimal anterior slip L4. Bulge. Mild bilateral foraminal narrowing. Mild bilateral lateral recess stenosis. Very mild thecal sac narrowing.  L5-S1: Small Schmorl's node deformity. Mild facet joint degenerative changes. Mild bulge. Very mild foraminal narrowing.  IMPRESSION: Exam is motion  degraded.  Fluid within the L3-4 and L4-5 facet joints typically related to degenerative changes. Septic arthritis cannot be excluded given history of bacteremia.  Mild edema along the psoas muscles and posterior aspect of the iliacus muscles can be seen with third spacing of fluid, spread of infection cannot be excluded.  Fluid like signal intensity anterior aspect of the L5-S1 disc without secondary findings of discitis.  If there were a high clinical concern of infection involving the lumbar spine than close follow-up imaging may be considered.  Mild degenerative changes most notable L4-5 as detailed above.  Decreased signal intensity of bone marrow may be related to patient's habitus. Correlation with CBC to exclude anemia contributing to this appearance may be considered   Electronically Signed   By: Lacy Duverney M.D.   On: 06/11/2015 18:23   Mr Shoulder Right Wo Contrast  06/12/2015   CLINICAL DATA:  RIGHT shoulder pain. Initial encounter. Bacteremia. Evaluate source of bacteremia.  EXAM: MRI OF  THE RIGHT SHOULDER WITHOUT CONTRAST  TECHNIQUE: Multiplanar, multisequence MR imaging of the shoulder was performed. No intravenous contrast was administered.  COMPARISON:  Radiographs 06/08/2015.  Chest CT 01/03/2013.  FINDINGS: Study is degraded by motion artifact. The patient was unable the comply with scanning and remaining motionless. Technical parameters were modified which produces artifact however these modifications do produce a diagnostic study.  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  Muscles: Mild intramuscular edema, with more prominent edema along the interstitial spaces of the rotator cuff musculature. This is probably a reflection of volume status and anasarca.  Biceps long head:  Normal.  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 Subacromial bursal fluid is present however this probably is a reflection of volume status rather than bursitis.   Glenohumeral Joint: No effusion or synovitis. No evidence of septic arthritis.  Labrum:  Grossly normal.  Bones: Cystic lesion is present in the distal RIGHT clavicle. This was evident on prior plain films in retrospect. The lesion measures 17 mm long axis and the appearance is nonspecific. There are geographic well defined sclerotic margins in the appearance is nonaggressive. 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 Osteomyelitis is unlikely and the area appears chronic. Significantly, no periosteal reaction. Furthermore, this area was visible on prior chest CT from 01/03/2013 and appears unchanged.  IMPRESSION: 1. Motion degraded study. Technical parameters were modified to produce a diagnostic study. 2. Diffuse edema more prominent in the interstitial spaces than in the rotator cuff musculature, likely reflection of volume status/anasarca. 3. Normal glenohumeral joint. No evidence of septic arthritis at the [facility] joint or glenohumeral joint. 4. 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   Electronically Signed   By: Andreas Newport M.D.   On: 06/12/2015 08:14   Dg Chest Port 1 View  06/09/2015   CLINICAL DATA:  Weakness.  EXAM: PORTABLE CHEST - 1 VIEW  COMPARISON:  06/08/2015  FINDINGS: Normal heart size. No pleural effusion identified. Lung bases stress that mild bibasilar atelectasis noted. No airspace consolidation.  IMPRESSION: 1. Bibasilar atelectasis noted.   Electronically Signed   By: Signa Kell M.D.   On: 06/09/2015 13:31   Carotid Doppler  There is 1-39% bilateral ICA stenosis. Vertebral artery flow is antegrade.     PHYSICAL EXAM Pleasant elderly Caucasian male not in distress. . Afebrile. Head is nontraumatic. Neck is supple without bruit.    Cardiac exam no murmur or gallop. Lungs are clear to auscultation. Distal pulses are well felt. Neurological Exam : Awake alert oriented x 3 normal speech and language. Mild left lower face asymmetry.  Tongue midline. No drift. Mild diminished fine finger movements on left. Orbits right over left upper extremity. Mild left grip weak.. Normal sensation . Normal coordination. ASSESSMENT/PLAN Jacob White is a 69 y.o. male with history of diabetes mellitus, hypertension, and hyperlipidemia presenting with gradual onset of confusion and lethargy. Workup MRI showed stroke in the setting of infection. He did not receive IV t-PA due to unknown time last known well.   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 Small subacute R frontal cortical/subcortical infarct. Old cerebellar infarcts. Infarcts like secondary to chronic small vessel disease.   Resultant  Very mild left hemiparesis. Left facial.  MRI  Punctate R frontal and parietal cortical infarcts. Small subacute R frontal cortical/subcortical infarct. Old cerebellar infarcts.  Carotid Doppler  No significant stenosis   2D Echo  pending   LDL  42  HgbA1c  8.9  Lovenox 40 mg sq daily for VTE prophylaxis Diet heart healthy/carb modified Room service appropriate?: Yes; Fluid consistency:: Thin  no antithrombotic prior to admission, now on no antithrombotic. We will go ahead and add aspirin 325 mg daily  Ongoing aggressive stroke risk factor management  Therapy recommendations:  Inpatient rehabilitation. Consult in place.  Disposition:  pending   Hypertension  Home meds:   Lisinopril/hydrochlorothiazide  Stable  Hyperlipidemia  Home meds:  pravachol 40, resumed in hospital. Also omega-3  LDL 42, goal < 70  Continue statin at discharge  Diabetes  HgbA1c pending , goal < 7.0  Other Stroke Risk Factors  Advanced age  Other Active Problems  MRSA bacteremia. On  Antithrombotic  Reason right shoulder injury  Generalized weakness  Acute kidney injury due to dehydration  Hypokalemia  Hospital day # 5  Zach Tietje  [facility] See Amion for Pager information 06/13/2015 2:45 PM   I have personally examined this patient, reviewed notes, independently viewed imaging studies, participated in medical decision making and plan of care. I have made any additions or clarifications directly to the above note.  Agree with checking lower extremity venous Dopplers..Continue aspirin,. D/W family at bedside. Delia Heady, MD Medical Director [facility] Pager: [phone] 06/13/2015 2:45 PM    To contact Stroke Continuity provider, please refer to [email or web address]. After hours, contact General Neurology

## 2015-06-13 NOTE — Progress Notes (Signed)
Rehab admissions - Per Dr. Benjamine MolaVann, patient now ready for acute inpatient rehab admission.  Bed available and will admit to rehab today.  Call me for questions.  #[phone]

## 2015-06-13 NOTE — Progress Notes (Signed)
Nutrition Follow-up  DOCUMENTATION CODES:  Not applicable  INTERVENTION:  Ensure Enlive (each supplement provides 350kcal and 20 grams of protein)  NUTRITION DIAGNOSIS:  Increased nutrient needs related to acute illness as evidenced by percent weight loss.  Progressing  GOAL:  Patient will meet greater than or equal to 90% of their needs  Progressing  MONITOR:  Supplement acceptance, PO intake, Labs, I & O's  REASON FOR ASSESSMENT:  Malnutrition Screening Tool    ASSESSMENT: 69 y.o. male PMHx: DM2, HTN, HLD. Presents to the hospital after seeing his PCP earlier today this morning due to right upper extremity pain, weakness, confusion. Believed to have UTI.  Spoke with pt at bedside. He reports that his meal intake is good; reports he "ate almost all" of his breakfast this AM. Intake improving, per doc flowsheets- noted 15-100% meal completion. Pt reports he is swallowing well and recalled that SLP followed-up with him earlier today. Noted that diet has been advanced to regular consistency.   Spoke with RN who confirms diet advancement and that pt has been consuming his Ensure supplements.   Discussed importance of continued good meal and supplement intake to promote healing.   Labs reviewed: Na: 129, K: 3.2 (on supplement).   Height:  Ht Readings from Last 1 Encounters:  06/08/15 5\' 6"  (1.676 m)    Weight:  Wt Readings from Last 1 Encounters:  06/09/15 134 lb 8 oz (61.009 kg)    Ideal Body Weight:  64.54 kg  Wt Readings from Last 10 Encounters:  06/09/15 134 lb 8 oz (61.009 kg)    BMI:  Body mass index is 21.72 kg/(m^2).  Estimated Nutritional Needs:  Kcal:  1600-1800 (26-29 kcal/kg)  Protein:  67-79 g (1.1-1.3 g/kg)  Fluid:  1.6-1.8 liters  Skin:  Reviewed, no issues  Diet Order:  Diet heart healthy/carb modified Room service appropriate?: Yes; Fluid consistency:: Thin  EDUCATION NEEDS:  No education needs identified at this  time   Intake/Output Summary (Last 24 hours) at 06/13/15 1347 Last data filed at 06/13/15 1343  Gross per 24 hour  Intake   1198 ml  Output   1600 ml  Net   -402 ml    Last BM:  06/13/15  Alrick Cubbage A. Mayford KnifeWilliams, RD, LDN, CDE Pager: [phone] After hours Pager: [phone]

## 2015-06-13 NOTE — Progress Notes (Signed)
Patient information reviewed and entered into eRehab system by Hatem Cull, RN, CRRN, PPS Coordinator.  Information including medical coding and functional independence measure will be reviewed and updated through discharge.     Per nursing patient was given "Data Collection Information Summary for Patients in Inpatient Rehabilitation Facilities with attached "Privacy Act Statement-Health Care Records" upon admission.  

## 2015-06-13 NOTE — Progress Notes (Signed)
Speech Language Pathology Treatment: Dysphagia  Patient Details Name: Jacob White MRN: [id number] DOB: 04/25/1946 Today's Date: 06/13/2015 Time: 1130-1140 SLP Time Calculation (min) (ACUTE ONLY): 10 min  Assessment / Plan / Recommendation Clinical Impression  Dysphagia treatment provided today for diet tolerance/ trials of upgraded diet and pt education. Pt demonstrated no overt s/s of aspiration at bedside today. Pt demonstrated mildly prolonged mastication and effectively alternated between solid and liquid consistencies. No oral residue or pocketing noted. Spoke with NT who reported no difficulties observed during meal today. Risk of aspiration appears mild at this time. Recommend advancing diet to regular/ thin liquids, continue meds whole in puree, intermittent supervision during meals to cue small bites/ sips and alternating liquids with solids. Will f/u x1 to ensure tolerance of regular diet.   HPI Other Pertinent Information: Jacob White is a 69 y.o. male with a history of diabetes mellitus, hypertension, and hyperlipidemia who [facility] after seeing his primary care physician for right shoulder pain. Wife also reported increasing lethargy and confusion. MRI showed acute to subacute infarcts in the right frontal and parietal lobes, as well as older infarcts.   Pertinent Vitals Pain Assessment: No/denies pain  SLP Plan  Continue with current plan of care    Recommendations Diet recommendations: Regular;Thin liquid Liquids provided via: Cup;Straw Medication Administration: Whole meds with puree Supervision: Patient able to self feed;Intermittent supervision to cue for compensatory strategies Compensations: Slow rate;Small sips/bites;Check for pocketing;Follow solids with liquid Postural Changes and/or Swallow Maneuvers: Seated upright 90 degrees              Oral Care Recommendations: Oral care BID Follow up Recommendations: None Plan:  Continue with current plan of care    GO     Metro KungOleksiak, Halil Rentz K, MA, CCC-SLP 06/13/2015, 11:47 AM  [phone]

## 2015-06-13 NOTE — Progress Notes (Signed)
Patient arrived onto inpatient rehab with assistance of nurse tech via wheelchair, with all belongings. Given admission information packet, and all questions answered. Discussed safety plan and agreement, patient verbalized understanding. Will continue to monitor patient.[place]

## 2015-06-13 NOTE — Progress Notes (Signed)
Report given to Marcene CorningNastasia, RN on 4W

## 2015-06-13 NOTE — Care Management Note (Signed)
Case Management Note  Patient Details  Name: Felix Pacinisam J Capetillo MRN: [id number] Date of Birth: 05/10/1946  Subjective/Objective:   Patient is for dc to CIR today.                 Action/Plan:   Expected Discharge Date:                  Expected Discharge Plan:  IP Rehab Facility  In-House Referral:     Discharge planning Services  CM Consult  Post Acute Care Choice:  IP Rehab Choice offered to:     DME Arranged:    DME Agency:     HH Arranged:    HH Agency:     Status of Service:  Completed, signed off  Medicare Important Message Given:  Yes-second notification given Date Medicare IM Given:    Medicare IM give by:    Date Additional Medicare IM Given:    Additional Medicare Important Message give by:     If discussed at Long Length of Stay Meetings, dates discussed:    Additional Comments:  Leone Havenaylor, [place], RN 06/13/2015, 1:57 PM

## 2015-06-13 NOTE — Progress Notes (Signed)
VASCULAR LAB PRELIMINARY  PRELIMINARY  PRELIMINARY  PRELIMINARY  Bilateral lower extremity venous duplex completed.    Preliminary report:  Bilateral:  No evidence of DVT, superficial thrombosis, or Baker's Cyst.    Fredricka Kohrs, RVT 06/13/2015, 12:26 PM

## 2015-06-13 NOTE — H&P (Signed)
Physical Medicine and Rehabilitation Admission H&P   Chief Complaint  Patient presents with  . Weakness    HPI: Jacob White is a 69 y.o. male admitted to [facility] on 06/08/2015 after seeing his primary care physician for right shoulder pain. The patient injured his shoulder while trying to move a picnic table while sitting on his riding mower. During that visit the patient's wife reported that the patient has been very lethargic recently and has been increasingly confused over the past 3-4 weeks. 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 Neurology feels infarcts likely due to chronic small vessel disease and to rule out infectious source. ASA added for secondary stroke prevention. Hospital course complicated by MRSA bacteremia and patient placed on IV vancomycin for treatment.   Dr. Linus Salmons consulted for input and recommended full work up to evaluate source of bacteremia. Repeat blood cultures done on 06/27 and pending. MRI of the lumbar spine shows fluid within L3/4 and L4/5 facet joints likely due to degenerative changes and mild edema along psoas and posterior aspect of iliacus muscle likely due to third spacing or spread of infection. MRI right shoulder shows diffuse edema in interstitial spaces likely due to anasarca, rotator cuff degeneration and no signs of septic arthritis. 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 BLE dopplers negative for DVT. Repeat cultures negative so far and to complete 2 week course (from 06/28) of IV antibiotics per ID. Pt has regressed from a functional standpoint since admission when he was min assist with PT. CIR recommended by MD and rehab team and patient cleared for admission today.    Review of Systems  HENT: Negative for hearing loss and tinnitus.  Eyes: Negative for blurred vision and double vision.    Respiratory: Negative for cough and shortness of breath.  Cardiovascular: Negative for chest pain, palpitations and leg swelling.  Gastrointestinal: Negative for heartburn, nausea, diarrhea and constipation.  Genitourinary: Negative for dysuria and urgency.  Musculoskeletal: Negative for myalgias, back pain, joint pain and neck pain.  Neurological: Positive for weakness (for the past month). Negative for dizziness, tingling, speech change, focal weakness and headaches.  Psychiatric/Behavioral: Negative for depression and memory loss. The patient does not have insomnia.     Past Medical History  Diagnosis Date  . Diabetes mellitus without complication   . Hypertension   . Hyperlipemia     Past Surgical History  Procedure Laterality Date  . Tee without cardioversion N/A 06/12/2015    Procedure: TRANSESOPHAGEAL ECHOCARDIOGRAM (TEE); Surgeon: Pixie Casino, MD; Location: [facility] ENDOSCOPY; Service: Cardiovascular; Laterality: N/A;     History reviewed. No pertinent family history.    Social History: Married. Independent and active PTA. 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 He reports that he has never smoked. He does not have any smokeless tobacco history on file. He reports that he does not drink alcohol. His drug history is not on file.    Allergies: No Known Allergies    Medications Prior to Admission  Medication Sig Dispense Refill  . lisinopril-hydrochlorothiazide (PRINZIDE,ZESTORETIC) 20-25 MG per tablet Take 1 tablet by mouth daily.    . meloxicam (MOBIC) 15 MG tablet Take 15 mg by mouth daily as needed for pain.     . metFORMIN (GLUCOPHAGE) 1000 MG tablet Take 1,000 mg by mouth 2 (two) times  daily with a meal.    . Omega-3 Fatty Acids (OMEGA 3 PO) Take 1 tablet by mouth daily.    Marland Kitchen oxyCODONE-acetaminophen (PERCOCET/ROXICET) 5-325 MG per tablet Take 1 tablet by mouth  daily as needed for moderate pain or severe pain.     . pravastatin (PRAVACHOL) 40 MG tablet Take 40 mg by mouth daily.      Home: Home Living Family/patient expects to be discharged to:: Inpatient rehab Living Arrangements: Spouse/significant other, Other relatives (grand-daughter and 44 year old great-granddaughter) Available Help at Discharge: Available 24 hours/day Type of Home: Mobile home Home Access: Stairs to enter [employer] of Steps: 4 Entrance Stairs-Rails: Left, Right, Can reach both Home Layout: One level Home Equipment: Cane - single point  Functional History: Prior Function Level of Independence: Independent  Functional Status:  Mobility: Bed Mobility Overal bed mobility: Needs Assistance Bed Mobility: Supine to Sit, Sit to Supine Supine to sit: Min assist, HOB elevated Sit to supine: Min guard General bed mobility comments: Assist to bring trunk up. Pt with posterior lean. Transfers Overall transfer level: Needs assistance Equipment used: 1 person hand held assist Transfers: Sit to/from Stand Sit to Stand: Min assist General transfer comment: increased time and cues to correct posterior lean Ambulation/Gait Ambulation/Gait assistance: Mod assist, +2 safety/equipment Ambulation Distance (Feet): 40 Feet (x 2) Assistive device: Rolling walker (2 wheeled) General Gait Details: Pt with posterior lean. Verbal cues to look up. Gait Pattern/deviations: Step-through pattern, Decreased step length - right, Decreased step length - left, Leaning posteriorly, Shuffle Gait velocity: decr Gait velocity interpretation: Below normal speed for age/gender    ADL: ADL Overall ADL's : Needs assistance/impaired Eating/Feeding: Independent (supported sitting) Grooming: Set up (supported sitting) Upper Body Bathing: Set up (supported sitting) Lower Body Bathing: Moderate assistance (with min A sit<>stand and to maintain standing) Upper Body Dressing :  Minimal assistance (supported sitting) Lower Body Dressing: Maximal assistance (with min A sit<>stand and to maintain standing) Toilet Transfer: Minimal assistance, Stand-pivot, BSC Toileting- Clothing Manipulation and Hygiene: Moderate assistance (with min A sit<>stand and to maintain standing)  Cognition: Cognition Overall Cognitive Status: Within Functional Limits for tasks assessed Orientation Level: Oriented X4 Cognition Arousal/Alertness: Awake/alert Behavior During Therapy: WFL for tasks assessed/performed Overall Cognitive Status: Within Functional Limits for tasks assessed   Blood pressure 112/72, pulse 93, temperature 98.9 F (37.2 C), temperature source Oral, resp. rate 18, height 5' 6"  (1.676 m), weight 61.009 kg (134 lb 8 oz), SpO2 99 %. Physical Exam  Nursing note and vitals reviewed. Constitutional: He is oriented to person, place, and time. He appears well-developed and well-nourished.  Frail appearing.  HENT: oral mucosa moist Head: Normocephalic and atraumatic.  Eyes: Conjunctivae are normal. Pupils are equal, round, and reactive to light.  Neck: Normal range of motion. Neck supple.  Cardiovascular: Normal rate and regular rhythm. no murmur Respiratory: Effort normal and breath sounds normal. No respiratory distress. He has no wheezes.  GI: Soft. Bowel sounds are normal. He exhibits no distension. There is no tenderness.  Musculoskeletal: He exhibits no edema or tenderness.  Neurological: He is alert and oriented to person, place, and time.  RUE slightly limited due to pain in shoulder otherwise 4+/5. LUE is 4/5 delt,bicep,tricep, wrist, hand with left pronator drift. LE: 3/5 HF bilaterally. 4/5 RKE, 4- LKE, ADF/APF nearly 4/5 both limbs. No sensory changes in either left or right arm/leg. Speech generally clear. Has mild left facial droop. Cognitively seems to display reasonable, basic insight and awareness. Language normal.  Skin: Skin is warm and dry.    Psychiatric: He has a normal mood and affect. His behavior is normal.     Lab Results Last 48 Hours    Results for orders placed or performed during the hospital encounter of 06/08/15 (from the past 48 hour(s))  Glucose, capillary Status: Abnormal   Collection Time: 06/11/15 5:52 PM  Result Value Ref Range   Glucose-Capillary 162 (H) 65 - 99 mg/dL  Glucose, capillary Status: Abnormal   Collection Time: 06/11/15 10:04 PM  Result Value Ref Range   Glucose-Capillary 124 (H) 65 - 99 mg/dL  Glucose, capillary Status: Abnormal   Collection Time: 06/12/15 1:32 AM  Result Value Ref Range   Glucose-Capillary 134 (H) 65 - 99 mg/dL  CBC Status: Abnormal   Collection Time: 06/12/15 6:42 AM  Result Value Ref Range   WBC 9.5 4.0 - 10.5 K/uL   RBC 4.48 4.22 - 5.81 MIL/uL   Hemoglobin 13.1 13.0 - 17.0 g/dL   HCT 37.7 (L) 39.0 - 52.0 %   MCV 84.2 78.0 - 100.0 fL   MCH 29.2 26.0 - 34.0 pg   MCHC 34.7 30.0 - 36.0 g/dL   RDW 13.4 11.5 - 15.5 %   Platelets 145 (L) 150 - 400 K/uL  Basic metabolic panel Status: Abnormal   Collection Time: 06/12/15 6:42 AM  Result Value Ref Range   Sodium 129 (L) 135 - 145 mmol/L   Potassium 2.9 (L) 3.5 - 5.1 mmol/L   Chloride 91 (L) 101 - 111 mmol/L   CO2 29 22 - 32 mmol/L   Glucose, Bld 150 (H) 65 - 99 mg/dL   BUN 17 6 - 20 mg/dL   Creatinine, Ser 0.96 0.61 - 1.24 mg/dL   Calcium 7.6 (L) 8.9 - 10.3 mg/dL   GFR calc non Af Amer >60 >60 mL/min   GFR calc Af Amer >60 >60 mL/min    Comment: (NOTE) The eGFR has been calculated using the CKD EPI equation. This calculation has not been validated in all clinical situations. eGFR's persistently <60 mL/min signify possible Chronic Kidney Disease.    Anion gap 9 5 - 15  Glucose, capillary Status: Abnormal   Collection Time: 06/12/15 8:00 AM  Result  Value Ref Range   Glucose-Capillary 143 (H) 65 - 99 mg/dL  MRSA PCR Screening Status: Abnormal   Collection Time: 06/12/15 8:37 AM  Result Value Ref Range   MRSA by PCR POSITIVE (A) NEGATIVE    Comment:   The GeneXpert MRSA Assay (FDA approved for NASAL specimens only), is one component of a comprehensive MRSA colonization surveillance program. It is not intended to diagnose MRSA infection nor to guide or monitor treatment for MRSA infections.   Glucose, capillary Status: Abnormal   Collection Time: 06/12/15 12:31 PM  Result Value Ref Range   Glucose-Capillary 128 (H) 65 - 99 mg/dL  Vancomycin, trough Status: Abnormal   Collection Time: 06/12/15 1:34 PM  Result Value Ref Range   Vancomycin Tr 9 (L) 10.0 - 20.0 ug/mL  Glucose, capillary Status: Abnormal   Collection Time: 06/12/15 5:19 PM  Result Value Ref Range   Glucose-Capillary 144 (H) 65 - 99 mg/dL  Glucose, capillary Status: Abnormal   Collection Time: 06/12/15 9:24 PM  Result Value Ref Range   Glucose-Capillary 199 (H) 65 - 99 mg/dL  Magnesium Status: None   Collection Time: 06/13/15 5:22 AM  Result Value Ref Range   Magnesium 1.8 1.7 - 2.4 mg/dL  Basic metabolic panel Status:  Abnormal   Collection Time: 06/13/15 5:22 AM  Result Value Ref Range   Sodium 129 (L) 135 - 145 mmol/L   Potassium 3.2 (L) 3.5 - 5.1 mmol/L   Chloride 89 (L) 101 - 111 mmol/L   CO2 26 22 - 32 mmol/L   Glucose, Bld 176 (H) 65 - 99 mg/dL   BUN 15 6 - 20 mg/dL   Creatinine, Ser 1.00 0.61 - 1.24 mg/dL   Calcium 7.7 (L) 8.9 - 10.3 mg/dL   GFR calc non Af Amer >60 >60 mL/min   GFR calc Af Amer >60 >60 mL/min    Comment: (NOTE) The eGFR has been calculated using the CKD EPI equation. This calculation has not been validated in all clinical situations. eGFR's persistently <60 mL/min  signify possible Chronic Kidney Disease.    Anion gap 14 5 - 15  Glucose, capillary Status: Abnormal   Collection Time: 06/13/15 8:15 AM  Result Value Ref Range   Glucose-Capillary 149 (H) 65 - 99 mg/dL  Glucose, capillary Status: Abnormal   Collection Time: 06/13/15 12:02 PM  Result Value Ref Range   Glucose-Capillary 244 (H) 65 - 99 mg/dL      Imaging Results (Last 48 hours)    Mr Lumbar Spine Wo Contrast  06/11/2015 CLINICAL DATA: 69 year old diabetic hypertensive male with bacteremia. Generalize weakness. Subsequent encounter. EXAM: MRI LUMBAR SPINE WITHOUT CONTRAST TECHNIQUE: Multiplanar, multisequence MR imaging of the lumbar spine was performed. No intravenous contrast was administered. COMPARISON: No comparison lumbar examination. MR of the shoulder performed the same date and dictated separately. 06/10/2015 MR brain. FINDINGS: Exam is motion degraded. Fluid within the L3-4 and L4-5 facet joints typically related to degenerative changes. Septic arthritis cannot be excluded given history of bacteremia. Mild edema along the psoas muscles and posterior aspect of the iliacus muscles can be seen with third spacing of fluid, spread of infection cannot be excluded. Fluid like signal intensity anterior aspect of the L5-S1 disc without secondary findings of discitis. T12-L1: Negative. L1-2: Mild facet joint degenerative changes. Minimal bulge. L2-3: Mild facet joint degenerative changes. Minimal bulge. L3-4: Facet joint degenerative changes containing fluid as noted above. Mild bulge. Very mild spinal stenosis and very mild bilateral foraminal narrowing. L4-5: Facet joint degenerative changes containing fluid as noted above. Minimal anterior slip L4. Bulge. Mild bilateral foraminal narrowing. Mild bilateral lateral recess stenosis. Very mild thecal sac narrowing. L5-S1: Small Schmorl's node deformity. Mild facet joint degenerative changes. Mild  bulge. Very mild foraminal narrowing. IMPRESSION: Exam is motion degraded. Fluid within the L3-4 and L4-5 facet joints typically related to degenerative changes. Septic arthritis cannot be excluded given history of bacteremia. Mild edema along the psoas muscles and posterior aspect of the iliacus muscles can be seen with third spacing of fluid, spread of infection cannot be excluded. Fluid like signal intensity anterior aspect of the L5-S1 disc without secondary findings of discitis. If there were a high clinical concern of infection involving the lumbar spine than close follow-up imaging may be considered. Mild degenerative changes most notable L4-5 as detailed above. Decreased signal intensity of bone marrow may be related to patient's habitus. Correlation with CBC to exclude anemia contributing to this appearance may be considered Electronically Signed By: Genia Del M.D. On: 06/11/2015 18:23   Mr Shoulder Right Wo Contrast  06/12/2015 CLINICAL DATA: RIGHT shoulder pain. Initial encounter. Bacteremia. Evaluate source of bacteremia. EXAM: MRI OF THE RIGHT SHOULDER WITHOUT CONTRAST TECHNIQUE: Multiplanar, multisequence MR imaging of the shoulder was performed. No intravenous  contrast was administered. COMPARISON: Radiographs 06/08/2015. Chest CT 01/03/2013. FINDINGS: Study is degraded by motion artifact. The patient was unable the comply with scanning and remaining motionless. Technical parameters were modified which produces artifact however these modifications do produce a diagnostic study. Rotator cuff: Rotator cuff tendinopathy without tear. Muscles: Mild intramuscular edema, with more prominent edema along the interstitial spaces of the rotator cuff musculature. This is probably a reflection of volume status and anasarca. Biceps long head: Normal. 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 Subacromial bursal fluid is present however this  probably is a reflection of volume status rather than bursitis. Glenohumeral Joint: No effusion or synovitis. No evidence of septic arthritis. Labrum: Grossly normal. Bones: Cystic lesion is present in the distal RIGHT clavicle. This was evident on prior plain films in retrospect. The lesion measures 17 mm long axis and the appearance is nonspecific. There are geographic well defined sclerotic margins in the appearance is nonaggressive. 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 Osteomyelitis is unlikely and the area appears chronic. Significantly, no periosteal reaction. Furthermore, this area was visible on prior chest CT from 01/03/2013 and appears unchanged. IMPRESSION: 1. Motion degraded study. Technical parameters were modified to produce a diagnostic study. 2. Diffuse edema more prominent in the interstitial spaces than in the rotator cuff musculature, likely reflection of volume status/anasarca. 3. Normal glenohumeral joint. No evidence of septic arthritis at the [facility] joint or glenohumeral joint. 4. 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 Electronically Signed By: Dereck Ligas M.D. On: 06/12/2015 08:14        Medical Problem List and Plan: 1. Functional deficits secondary to right fontal-parietal infarct complicated by MRSA bacteremia (?source) 2. DVT Prophylaxis/Anticoagulation: Pharmaceutical: Lovenox 3. Pain Management: Denies any back or shoulder pain. Will use Tylenol prn.  4. Mood: LCSW to follow for evaluation and support.  5. Neuropsych: This patient is not capable of making decisions on his own behalf. 6. Skin/Wound Care: Routine pressure relief measures.  7. Fluids/Electrolytes/Nutrition: Monitor I/O. Check lytes in am.  8. MRSA Bacteremia: Continue Vancomycin through July 11 th with trough goal 15-20 as long as blood cultures remain negative. OK for PICC tomorrow if needed. Has completed 5 day course of diflucan  for yeast in urine.  9. Hyponatremia: Multifactorial due to IVF/HCTZ.Recheck lytes in am.  10 Hypokalemia: Continue supplement for now. Check labs in am. Should improve with HCTZ stopped.  11. DM type 2: Monitor BS ac/hs. Continue lantus for now. Was on metformin bid at home--HgbA1C- 8.9 12. HTN: Monitor BP bid and adjust meds as indicated. Continue prinivil. Will hold HXTZ due to continued drop in Na+       Post Admission Physician Evaluation: 1. Functional deficits secondary to right fontal-parietal infarct complicated by MRSA bacteremia (?source) 2. Patient is admitted to receive collaborative, interdisciplinary care between the physiatrist, rehab nursing staff, and therapy team. 3. Patient's level of medical complexity and substantial therapy needs in context of that medical necessity cannot be provided at a lesser intensity of care such as a SNF. 4. Patient has experienced substantial functional loss from his/her baseline which was documented above under the "Functional History" and "Functional Status" headings. Judging by the patient's diagnosis, physical exam, and functional history, the patient has potential for functional progress which will result in measurable gains while on inpatient rehab. These gains will be of substantial and practical use upon discharge in facilitating mobility and self-care at the household level. 5. Physiatrist  will provide 24 hour management of medical needs as well as oversight of the therapy plan/treatment and provide guidance as appropriate regarding the interaction of the two. 6. 24 hour rehab nursing will assist with bladder management, bowel management, safety, skin/wound care, disease management, medication administration, pain management and patient education and help integrate therapy concepts, techniques,education, etc. 7. PT will assess and treat for/with: Lower extremity strength, range of motion, stamina, balance, functional mobility,  safety, adaptive techniques and equipment, NMR, visual-spatial awareness, family education. Goals are: mod I to supervision. 8. OT will assess and treat for/with: ADL's, functional mobility, safety, upper extremity strength, adaptive techniques and equipment, NMR, visual-spatial awareness, ego support, community awarenes. Goals are: mod I to supervision. Therapy may proceed with showering this patient. 9. SLP will assess and treat for/with: cognition, communication. Goals are: mod I to supervision. 10. Case Management and Social Worker will assess and treat for psychological issues and discharge planning. 11. Team conference will be held weekly to assess progress toward goals and to determine barriers to discharge. 12. Patient will receive at least 3 hours of therapy per day at least 5 days per week. 13. ELOS: 7-10 days  14. Prognosis: excellent     Meredith Staggers, MD, [facility] 06/13/2015

## 2015-06-13 NOTE — Discharge Summary (Signed)
Physician Discharge Summary  [name]:[id number] DOB: [birth year] DOA: 06/08/2015  PCP: No primary care provider on file.  Admit date: 06/08/2015 Discharge date: 06/13/2015  Time spent: 35 minutes  Recommendations for Outpatient Follow-up:  1. IV vanc 4 weeks- ID follow up- weekly BMP and vanc trough 2. PICC line at rehab 3. outpatient follow up with Dr. Pearlean Brownie for possible loop recorder once infection cleared 4.  Consider structural heart team consultation (Dr. Excell Seltzer or Clifton James) for possible Amplatzer-device closure of moderate-sized secundum ASD with bidirectional flow. As outpatient 5.  Needs better blood sugar control   Discharge Diagnoses:  Active Problems:   Weakness generalized   Confusion   Dehydration   Diabetes mellitus without complication   Pain of right upper extremity   Cerebral thrombosis with cerebral infarction   MRSA bacteremia   Discharge Condition: improved  Diet recommendation: heart healthy  Filed Weights   06/08/15 1558 06/08/15 2038 06/09/15 0440  Weight: 61.236 kg (135 lb) 60.555 kg (133 lb 8 oz) 61.009 kg (134 lb 8 oz)    History of present illness:  Jacob White is a 69 y.o. male  With a history of type 2 diabetes on oral medication, hypertension, hyperlipidemia. He presents to the hospital after seeing his primary care physician earlier today this morning due to right upper extremity pain, weakness, confusion. The patient injured his right arm while mowing the lawn and has had right neck pain and shoulder pain since that time and diagnosed with right extremity musculoskeletal pain by the emergency department here. However, upon history the patient has described weakness and difficulty ambulating over the past week which has been worsening over the past several days. Additionally, his wife states that he has been "talking out of his head". His weakness is worse with ambulation and improved with rest. There are no focal deficits according to his  wife. Additionally, they deny slurred speech. He was also found to be febrile in the emergency department  Hospital Course:  2/2 MRSA bacteremia- repeat culture NGTD -vanc 4 week -? Source-- watch for back pain 1. -echo---TEE 6/28: No LAA thrombus 2. 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 There appears to be an adequate "rim" of atrial septal tissue for possible device closure. 3. Moderate concentric LVH 4. LVEF 55-60% No evidence for valvular endocarditis -ID consulted -duplex negative -discussed with Dr. Luciana Axe and Dr. Pearlean Brownie  CVA -MRI + -carotids ok -TEE done -neuro consulted -FLP: LDL 42 -HgbA1C 8.9  DM -SSI -added lantus  Recent shoulder injury -MRI right shoulder done  HTN -continue home meds  AKI due to Dehydration -IVF- resolved  Hypokalemia -repleted -check Mg   Procedures:  TEE  MRI  Consultations:  Rehab  Cards for TEE  Neuro  ID  Discharge Exam: Filed Vitals:   06/13/15 1047  BP: 112/72  Pulse: 93  Temp:   Resp:      Discharge Instructions    Current Discharge Medication List    CONTINUE these medications which have NOT CHANGED   Details  lisinopril-hydrochlorothiazide (PRINZIDE,ZESTORETIC) 20-25 MG per tablet Take 1 tablet by mouth daily.    meloxicam (MOBIC) 15 MG tablet Take 15 mg by mouth daily as needed for pain.     metFORMIN (GLUCOPHAGE) 1000 MG tablet Take 1,000 mg by mouth 2 (two) times  daily with a meal.    Omega-3 Fatty Acids (OMEGA 3 PO) Take 1 tablet by mouth daily.    oxyCODONE-acetaminophen (PERCOCET/ROXICET) 5-325 MG per tablet Take 1 tablet by mouth daily as needed for moderate pain or severe pain.     pravastatin (PRAVACHOL) 40 MG tablet Take 40 mg by mouth daily.       No Known Allergies    The  results of significant diagnostics from this hospitalization (including imaging, microbiology, ancillary and laboratory) are listed below for reference.    Significant Diagnostic Studies: Dg Chest 2 View  06/08/2015   CLINICAL DATA:  Patient was moving picnic table. Felt sharp pain on the right shoulder. Pain under the breast on the anterior sign. Shortness of breath. History of hypertension, diabetes, smoking.  EXAM: CHEST  2 VIEW  COMPARISON:  None.  FINDINGS: The heart size and mediastinal contours are within normal limits. Both lungs are clear. The visualized skeletal structures are unremarkable.  IMPRESSION: No active cardiopulmonary disease.   Electronically Signed   By: Norva Pavlov M.D.   On: 06/08/2015 17:02   Dg Ribs Unilateral Right  06/08/2015   CLINICAL DATA:  Right chest pain while moving picnic table, initial encounter  EXAM: RIGHT RIBS - 2 VIEW  COMPARISON:  None.  FINDINGS: No fracture or other bone lesions are seen involving the ribs.  IMPRESSION: No acute abnormality noted.   Electronically Signed   By: Alcide Clever M.D.   On: 06/08/2015 17:02   Dg Shoulder Right  06/08/2015   CLINICAL DATA:  69 year old male with acute onset right shoulder pain with heavy lifting. Initial encounter.  EXAM: RIGHT SHOULDER - 2+ VIEW  COMPARISON:  [facility] Portable chest radiograph 01/03/2013  FINDINGS: Two views of the right shoulder. No glenohumeral joint dislocation. Proximal right humerus intact. Stable smooth exostosis at the midshaft which might be sequelae of remote trauma or related to muscle insertion. No right clavicle or scapula fracture. Visible right ribs and lung parenchyma within normal limits.  IMPRESSION: No acute osseous abnormality identified at the right shoulder.   Electronically Signed   By: Odessa Fleming M.D.   On: 06/08/2015 17:02   Ct Head Wo Contrast  06/08/2015   CLINICAL DATA:  Confusion.  EXAM: CT HEAD WITHOUT CONTRAST  TECHNIQUE: Contiguous axial  images were obtained from the base of the skull through the vertex without intravenous contrast.  COMPARISON:  None.  FINDINGS: No acute intracranial abnormality. Specifically, no hemorrhage, hydrocephalus, mass lesion, acute infarction, or significant intracranial injury. No acute calvarial abnormality. Mild volume loss, compatible with patient's age. Visualized paranasal sinuses and mastoids clear. Orbital soft tissues unremarkable.  IMPRESSION: No acute intracranial abnormality.   Electronically Signed   By: Charlett Nose M.D.   On: 06/08/2015 17:10   Mr Brain Wo Contrast  06/10/2015   CLINICAL DATA:  Progressive confusion, weakness with shift wing gait, and fever.  EXAM: MRI HEAD WITHOUT CONTRAST  TECHNIQUE: Multiplanar, multiecho pulse sequences of the brain and surrounding structures were obtained without intravenous contrast.  COMPARISON:  Head CT 06/08/2015  FINDINGS: There are small foci of abnormal hyperintense diffusion weighted signal measuring 6 mm in right frontal cortex and 4 mm in right parietal cortex with normal to slightly restricted diffusion on the ADC map compatible with acute to early subacute infarcts. There is a small cortical and subcortical infarct in the posterior right frontal lobe which appears subacute to chronic with some T2 shine through on diffusion-weighted  imaging. A small amount of chronic blood products are noted associated with this infarct, and there is also a small amount of microhemorrhage more posteriorly in the right frontal lobe, also likely related to prior ischemia.  There is no evidence of mass, midline shift, or extra-axial fluid collection. There is mild cerebral atrophy. Patchy T2 hyperintensities in the periventricular greater than subcortical cerebral white matter bilaterally and in the pons are nonspecific but compatible with mild chronic small vessel ischemic disease. Small, chronic infarcts are noted in the left cerebellar hemisphere and superior vermis.   Orbits are unremarkable. Paranasal sinuses and left mastoid air cells are clear. There is a small right mastoid effusion. Major intracranial vascular flow voids are preserved.  IMPRESSION: 1. Punctate, acute to early subacute right frontal and right parietal cortical infarcts. 2. Small, subacute to chronic posterior right frontal cortical/subcortical infarct. 3. Chronic cerebellar infarcts. 4. 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   Electronically Signed   By: Sebastian AcheAllen  Grady   On: 06/10/2015 14:42   Mr Lumbar Spine Wo Contrast  06/11/2015   CLINICAL DATA:  69 year old diabetic hypertensive male with bacteremia. Generalize weakness. Subsequent encounter.  EXAM: MRI LUMBAR SPINE WITHOUT CONTRAST  TECHNIQUE: Multiplanar, multisequence MR imaging of the lumbar spine was performed. No intravenous contrast was administered.  COMPARISON:  No comparison lumbar examination. MR of the shoulder performed the same date and dictated separately. 06/10/2015 MR brain.  FINDINGS: Exam is motion degraded.  Fluid within the L3-4 and L4-5 facet joints typically related to degenerative changes. Septic arthritis cannot be excluded given history of bacteremia.  Mild edema along the psoas muscles and posterior aspect of the iliacus muscles can be seen with third spacing of fluid, spread of infection cannot be excluded.  Fluid like signal intensity anterior aspect of the L5-S1 disc without secondary findings of discitis.  T12-L1:  Negative.  L1-2:  Mild facet joint degenerative changes.  Minimal bulge.  L2-3:  Mild facet joint degenerative changes.  Minimal bulge.  L3-4: Facet joint degenerative changes containing fluid as noted above. Mild bulge. Very mild spinal stenosis and very mild bilateral foraminal narrowing.  L4-5: Facet joint degenerative changes containing fluid as noted above. Minimal anterior slip L4. Bulge. Mild bilateral foraminal narrowing. Mild bilateral lateral recess stenosis. Very mild  thecal sac narrowing.  L5-S1: Small Schmorl's node deformity. Mild facet joint degenerative changes. Mild bulge. Very mild foraminal narrowing.  IMPRESSION: Exam is motion degraded.  Fluid within the L3-4 and L4-5 facet joints typically related to degenerative changes. Septic arthritis cannot be excluded given history of bacteremia.  Mild edema along the psoas muscles and posterior aspect of the iliacus muscles can be seen with third spacing of fluid, spread of infection cannot be excluded.  Fluid like signal intensity anterior aspect of the L5-S1 disc without secondary findings of discitis.  If there were a high clinical concern of infection involving the lumbar spine than close follow-up imaging may be considered.  Mild degenerative changes most notable L4-5 as detailed above.  Decreased signal intensity of bone marrow may be related to patient's habitus. Correlation with CBC to exclude anemia contributing to this appearance may be considered   Electronically Signed   By: Lacy DuverneySteven  Olson M.D.   On: 06/11/2015 18:23   Mr Shoulder Right Wo Contrast  06/12/2015   CLINICAL DATA:  RIGHT shoulder pain. Initial encounter. Bacteremia. Evaluate source of bacteremia.  EXAM: MRI OF THE RIGHT SHOULDER WITHOUT CONTRAST  TECHNIQUE: Multiplanar,  multisequence MR imaging of the shoulder was performed. No intravenous contrast was administered.  COMPARISON:  Radiographs 06/08/2015.  Chest CT 01/03/2013.  FINDINGS: Study is degraded by motion artifact. The patient was unable the comply with scanning and remaining motionless. Technical parameters were modified which produces artifact however these modifications do produce a diagnostic study.  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  Muscles: Mild intramuscular edema, with more prominent edema along the interstitial spaces of the rotator cuff musculature. This is probably a reflection of volume status and anasarca.  Biceps long head:  Normal.  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 Subacromial bursal fluid is present however this probably is a reflection of volume status rather than bursitis.  Glenohumeral Joint: No effusion or synovitis. No evidence of septic arthritis.  Labrum:  Grossly normal.  Bones: Cystic lesion is present in the distal RIGHT clavicle. This was evident on prior plain films in retrospect. The lesion measures 17 mm long axis and the appearance is nonspecific. There are geographic well defined sclerotic margins in the appearance is nonaggressive. 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 Osteomyelitis is unlikely and the area appears chronic. Significantly, no periosteal reaction. Furthermore, this area was visible on prior chest CT from 01/03/2013 and appears unchanged.  IMPRESSION: 1. Motion degraded study. Technical parameters were modified to produce a diagnostic study. 2. Diffuse edema more prominent in the interstitial spaces than in the rotator cuff musculature, likely reflection of volume status/anasarca. 3. Normal glenohumeral joint. No evidence of septic arthritis at the [facility] joint or glenohumeral joint. 4. 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   Electronically Signed   By: Andreas Newport M.D.   On: 06/12/2015 08:14   Dg Chest Port 1 View  06/09/2015   CLINICAL DATA:  Weakness.  EXAM: PORTABLE CHEST - 1 VIEW  COMPARISON:  06/08/2015  FINDINGS: Normal heart size. No pleural effusion identified. Lung bases stress that mild bibasilar atelectasis noted. No airspace consolidation.  IMPRESSION: 1. Bibasilar atelectasis noted.   Electronically Signed   By: Signa Kell M.D.   On: 06/09/2015 13:31    Microbiology: Recent Results (from the past 240 hour(s))  Blood culture (routine x 2)     Status: None   Collection Time: 06/08/15  8:10 PM  Result Value Ref Range Status   Specimen Description BLOOD RIGHT HAND  Final   Special Requests BOTTLES DRAWN  AEROBIC AND ANAEROBIC 5CC  Final   Culture  Setup Time   Final    GRAM POSITIVE COCCI IN PAIRS IN BOTH AEROBIC AND ANAEROBIC BOTTLES CRITICAL RESULT CALLED TO, READ BACK BY AND VERIFIED WITH: AROYAL,RN AT 1053 06/09/15 BY LBENFIELD    Culture METHICILLIN RESISTANT STAPHYLOCOCCUS AUREUS  Final   Report Status 06/11/2015 FINAL  Final   Organism ID, Bacteria METHICILLIN RESISTANT STAPHYLOCOCCUS AUREUS  Final      Susceptibility   Methicillin resistant staphylococcus aureus - MIC*    CIPROFLOXACIN >=8 RESISTANT Resistant     ERYTHROMYCIN <=0.25 SENSITIVE Sensitive     GENTAMICIN <=0.5 SENSITIVE Sensitive     OXACILLIN >=4 RESISTANT Resistant     TETRACYCLINE <=1 SENSITIVE Sensitive     VANCOMYCIN <=0.5 SENSITIVE Sensitive     TRIMETH/SULFA <=10 SENSITIVE Sensitive     CLINDAMYCIN <=0.25 SENSITIVE Sensitive     RIFAMPIN <=0.5 SENSITIVE Sensitive     Inducible Clindamycin NEGATIVE Sensitive     * METHICILLIN RESISTANT STAPHYLOCOCCUS AUREUS  Blood culture (routine x 2)     Status: None   Collection Time: 06/08/15  8:15 PM  Result Value Ref Range Status   Specimen Description BLOOD LEFT HAND  Final   Special Requests BOTTLES DRAWN AEROBIC AND ANAEROBIC 5CC  Final   Culture  Setup Time   Final    GRAM POSITIVE COCCI IN CLUSTERS IN BOTH AEROBIC AND ANAEROBIC BOTTLES CRITICAL RESULT CALLED TO, READ BACK BY AND VERIFIED WITH: AROYAL,RN AT 1153 06/09/15 BY LBENFIELD    Culture   Final    METHICILLIN RESISTANT STAPHYLOCOCCUS AUREUS SUSCEPTIBILITIES PERFORMED ON PREVIOUS CULTURE WITHIN THE LAST 5 DAYS.    Report Status 06/11/2015 FINAL  Final  Culture, blood (routine x 2)     Status: None (Preliminary result)   Collection Time: 06/11/15  4:50 AM  Result Value Ref Range Status   Specimen Description BLOOD RIGHT ARM  Final   Special Requests BOTTLES DRAWN AEROBIC AND ANAEROBIC 8CC 8CC  Final   Culture NO GROWTH 1 DAY  Final   Report Status PENDING  Incomplete  Culture, blood (routine x 2)      Status: None (Preliminary result)   Collection Time: 06/11/15  5:08 AM  Result Value Ref Range Status   Specimen Description BLOOD RIGHT ARM  Final   Special Requests BOTTLES DRAWN AEROBIC ONLY 7CC  Final   Culture NO GROWTH 1 DAY  Final   Report Status PENDING  Incomplete  Urine culture     Status: None   Collection Time: 06/11/15 11:04 AM  Result Value Ref Range Status   Specimen Description URINE, RANDOM  Final   Special Requests NONE  Final   Culture   Final    MULTIPLE SPECIES PRESENT, SUGGEST RECOLLECTION IF CLINICALLY INDICATED   Report Status 06/12/2015 FINAL  Final  MRSA PCR Screening     Status: Abnormal   Collection Time: 06/12/15  8:37 AM  Result Value Ref Range Status   MRSA by PCR POSITIVE (A) NEGATIVE Final    Comment:        The GeneXpert MRSA Assay (FDA approved for NASAL specimens only), is one component of a comprehensive MRSA colonization surveillance program. It is not intended to diagnose MRSA infection nor to guide or monitor treatment for MRSA infections.      Labs: Basic Metabolic Panel:  Recent Labs Lab 06/08/15 1718 06/09/15 0640 06/11/15 0629 06/12/15 0642 06/13/15 0522  NA 130* 131* 130* 129* 129*  K 3.7 3.5 2.8* 2.9* 3.2*  CL 91* 98* 95* 91* 89*  CO2 26 21* 24 29 26   GLUCOSE 240* 173* 141* 150* 176*  BUN 40* 29* 18 17 15   CREATININE 1.31* 1.10 0.99 0.96 1.00  CALCIUM 8.8* 8.0* 7.7* 7.6* 7.7*  MG  --   --  1.5*  --  1.8   Liver Function Tests:  Recent Labs Lab 06/08/15 1718  AST 21  ALT 19  ALKPHOS 107  BILITOT 0.8  PROT 7.4  ALBUMIN 2.8*   No results for input(s): LIPASE, AMYLASE in the last 168 hours. No results for input(s): AMMONIA in the last 168 hours. CBC:  Recent Labs Lab 06/08/15 1718 06/11/15 0629 06/12/15 0642  WBC 9.6 7.1 9.5  NEUTROABS 8.3*  --   --   HGB 13.7 11.8* 13.1  HCT 39.3 33.9* 37.7*  MCV 84.9 83.3 84.2  PLT 184 125* 145*   Cardiac Enzymes: No results for input(s): CKTOTAL, CKMB,  CKMBINDEX, TROPONINI in the last 168 hours. BNP:  BNP (last 3 results) No results for input(s): BNP in the last 8760 hours.  ProBNP (last 3 results) No results for input(s): PROBNP in the last 8760 hours.  CBG:  Recent Labs Lab 06/12/15 1231 06/12/15 1719 06/12/15 2124 06/13/15 0815 06/13/15 1202  GLUCAP 128* 144* 199* 149* 244*       Signed:  Adison Jerger  [facility] 06/13/2015, 1:02 PM

## 2015-06-13 NOTE — Progress Notes (Signed)
Patient transferred via wheelchair to [phone]. Family notified.

## 2015-06-14 ENCOUNTER — Inpatient Hospital Stay (HOSPITAL_COMMUNITY): Payer: Medicare Other | Admitting: Occupational Therapy

## 2015-06-14 ENCOUNTER — Inpatient Hospital Stay (HOSPITAL_COMMUNITY): Payer: Medicare Other | Admitting: Physical Therapy

## 2015-06-14 ENCOUNTER — Inpatient Hospital Stay (HOSPITAL_COMMUNITY): Payer: Medicare Other | Admitting: Speech Pathology

## 2015-06-14 DIAGNOSIS — E1142 Type 2 diabetes mellitus with diabetic polyneuropathy: Secondary | ICD-10-CM

## 2015-06-14 LAB — CBC WITH DIFFERENTIAL/PLATELET
Basophils Absolute: 0 10*3/uL (ref 0.0–0.1)
Basophils Relative: 0 % (ref 0–1)
Eosinophils Absolute: 0.1 10*3/uL (ref 0.0–0.7)
Eosinophils Relative: 1 % (ref 0–5)
HCT: 35.1 % — ABNORMAL LOW (ref 39.0–52.0)
Hemoglobin: 12.2 g/dL — ABNORMAL LOW (ref 13.0–17.0)
LYMPHS ABS: 0.8 10*3/uL (ref 0.7–4.0)
LYMPHS PCT: 10 % — AB (ref 12–46)
MCH: 29.3 pg (ref 26.0–34.0)
MCHC: 34.8 g/dL (ref 30.0–36.0)
MCV: 84.4 fL (ref 78.0–100.0)
Monocytes Absolute: 0.8 10*3/uL (ref 0.1–1.0)
Monocytes Relative: 10 % (ref 3–12)
Neutro Abs: 6.9 10*3/uL (ref 1.7–7.7)
Neutrophils Relative %: 79 % — ABNORMAL HIGH (ref 43–77)
Platelets: 200 10*3/uL (ref 150–400)
RBC: 4.16 MIL/uL — ABNORMAL LOW (ref 4.22–5.81)
RDW: 13.5 % (ref 11.5–15.5)
WBC: 8.6 10*3/uL (ref 4.0–10.5)

## 2015-06-14 LAB — COMPREHENSIVE METABOLIC PANEL
ALT: 62 U/L (ref 17–63)
ANION GAP: 8 (ref 5–15)
AST: 122 U/L — ABNORMAL HIGH (ref 15–41)
Albumin: 1.9 g/dL — ABNORMAL LOW (ref 3.5–5.0)
Alkaline Phosphatase: 121 U/L (ref 38–126)
BUN: 20 mg/dL (ref 6–20)
CO2: 27 mmol/L (ref 22–32)
Calcium: 7.8 mg/dL — ABNORMAL LOW (ref 8.9–10.3)
Chloride: 95 mmol/L — ABNORMAL LOW (ref 101–111)
Creatinine, Ser: 0.94 mg/dL (ref 0.61–1.24)
GFR calc Af Amer: >60 mL/min (ref >60)
Glucose, Bld: 160 mg/dL — ABNORMAL HIGH (ref 65–99)
POTASSIUM: 3.4 mmol/L — AB (ref 3.5–5.1)
SODIUM: 130 mmol/L — AB (ref 135–145)
Total Bilirubin: 0.6 mg/dL (ref 0.3–1.2)
Total Protein: 5.6 g/dL — ABNORMAL LOW (ref 6.5–8.1)

## 2015-06-14 LAB — GLUCOSE, CAPILLARY
GLUCOSE-CAPILLARY: 248 mg/dL — AB (ref 65–99)
Glucose-Capillary: 176 mg/dL — ABNORMAL HIGH (ref 65–99)
Glucose-Capillary: 282 mg/dL — ABNORMAL HIGH (ref 65–99)
Glucose-Capillary: 230 mg/dL — ABNORMAL HIGH (ref 65–99)

## 2015-06-14 MED ORDER — LISINOPRIL 20 MG PO TABS
20.0000 mg | ORAL_TABLET | Freq: Every day | ORAL | Status: DC
  Administered 2015-06-15 – 2015-06-19 (×5): 20 mg via ORAL
  Filled 2015-06-14 (×6): qty 1

## 2015-06-14 MED ORDER — GLUCERNA SHAKE PO LIQD
237.0000 mL | Freq: Two times a day (BID) | ORAL | Status: DC
  Administered 2015-06-14 – 2015-06-20 (×12): 237 mL via ORAL
  Filled 2015-06-14 (×12): qty 237

## 2015-06-14 MED ORDER — ENSURE ENLIVE PO LIQD: 237.0000 mL | Freq: Two times a day (BID) | ORAL | Status: DC

## 2015-06-14 MED ORDER — INSULIN GLARGINE 100 UNIT/ML ~~LOC~~ SOLN
5.0000 [IU] | Freq: Every day | SUBCUTANEOUS | Status: DC
  Administered 2015-06-15 – 2015-06-16 (×2): 5 [IU] via SUBCUTANEOUS
  Filled 2015-06-14 (×3): qty 0.05

## 2015-06-14 MED ORDER — LISINOPRIL 20 MG PO TABS
20.0000 mg | ORAL_TABLET | Freq: Every day | ORAL | Status: DC
  Filled 2015-06-14: qty 1

## 2015-06-14 MED ORDER — METFORMIN HCL 500 MG PO TABS
500.0000 mg | ORAL_TABLET | Freq: Two times a day (BID) | ORAL | Status: DC
  Administered 2015-06-14 – 2015-06-15 (×3): 500 mg via ORAL
  Filled 2015-06-14 (×5): qty 1

## 2015-06-14 NOTE — Progress Notes (Signed)
Trish Mage, RN Rehab Admission Coordinator Signed Physical Medicine and Rehabilitation PMR Pre-admission 06/12/2015 10:39 AM  Related encounter: ED to Hosp-Admission (Discharged) from 06/08/2015 in [facility] 5W MEDICAL    Expand All Collapse All   PMR Admission Coordinator Pre-Admission Assessment  Patient: Jacob White is an 69 y.o., male MRN: [id number] DOB: 01-31-1946 Height: 5\' 6"  (167.6 cm) Weight: 61.009 kg (134 lb 8 oz)  Insurance Information HMO: No PPO: PCP: IPA: 80/20: OTHER:  PRIMARY: Medicare A/B Policy#: [id number] Subscriber: [name] CM Name: Phone#: Fax#:  Pre-Cert#: Employer: Retired Benefits: Phone #: Name: Checked in Delta. 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 male admitted to [facility] on 06/08/2015 after seeing his primary care physician for right shoulder pain. The patient injured his shoulder while trying to move a picnic table while sitting on his riding mower. During that visit the patient's wife reported that the patient has been very lethargic recently and has been increasingly confused over the past 3-4 weeks. MRI of the brain revealed acute, small infarcts in  the right frontal and parietal cortex as well prior chronic infarcts. Hospital course complicated by MRSA bacteremia and patient placed on IV vancomycin for 4 weeks of Treatment. Will need a PICC line placed. Dr. Luciana Axe consulted for input and recommended full work up to evaluate source of bacteremia. Repeat blood cultures done on 06/27 and pending. MRI of the lumbar spine shows fluid within L3/4 and L4/5 facet joints likely due to degenerative changes and mild edema along psoas and posterior aspect of iliacus muscle likely due to third spacing or spread of infection. MRI right shoulder shows diffuse edema in interstitial spaces likely due to anasarca, rotator cuff degeneration and no signs of septic arthritis. 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 Pt has regressed from a functional standpoint since admission when he was min assist with PT. CIR recommended by MD and rehab team and patient now cleared for admission to inpatient rehab.    Total: 3=NIH  Past Medical History  Past Medical History  Diagnosis Date  . Diabetes mellitus without complication   . Hypertension   . Hyperlipemia     Family History  family history is not on file.  Prior Rehab/Hospitalizations:  Has the patient had major surgery during 100 days prior to admission? No  Current Medications   Current facility-administered medications:  . acetaminophen (TYLENOL) solution 500 mg, 500 mg, Oral, Q6H PRN, Joseph Art, DO . acetaminophen (TYLENOL) tablet 650 mg, 650 mg, Oral, Q6H PRN, 650 mg at 06/11/15 2344 **OR** acetaminophen (TYLENOL) suppository 650 mg, 650 mg, Rectal, Q6H PRN, Rhona Raider Stinson, DO . alum & mag hydroxide-simeth (MAALOX/MYLANTA) 200-200-20 MG/5ML suspension 30 mL, 30 mL, Oral, Q6H PRN, Rhona Raider Stinson, DO . aspirin EC tablet 325 mg, 325 mg, Oral, Daily, Layne Benton, NP, 325 mg at 06/13/15 1111 . Chlorhexidine Gluconate Cloth 2 % PADS  6 each, 6 each, Topical, Q0600,  Joseph Art, DO, 6 each at 06/13/15 [phone] . enoxaparin (LOVENOX) injection 40 mg, 40 mg, Subcutaneous, Q24H, Rhona Raider Stinson, DO, 40 mg at 06/12/15 2113 . feeding supplement (ENSURE ENLIVE) (ENSURE ENLIVE) liquid 237 mL, 237 mL, Oral, BID BM, Jessica U Vann, DO, 237 mL at 06/13/15 1000 . fluconazole (DIFLUCAN) tablet 200 mg, 200 mg, Oral, Daily, Rhona Raider Stinson, DO, 200 mg at 06/13/15 1110 . lisinopril (PRINIVIL,ZESTRIL) tablet 20 mg, 20 mg, Oral, Daily, 20 mg at 06/13/15 1110 **AND** hydrochlorothiazide (HYDRODIURIL) tablet 25 mg, 25 mg, Oral, Daily, Bertram Millard, RPH, 25 mg at 06/13/15 1110 . insulin aspart (novoLOG) injection 0-15 Units, 0-15 Units, Subcutaneous, TID WC, Rhona Raider Stinson, DO, 5 Units at 06/13/15 1232 . insulin aspart (novoLOG) injection 0-5 Units, 0-5 Units, Subcutaneous, QHS, Rhona Raider Coco, DO, 2 Units at 06/08/15 2231 . [START ON 06/14/2015] insulin glargine (LANTUS) injection 12 Units, 12 Units, Subcutaneous, Daily, Jessica U Vann, DO . magnesium sulfate IVPB 2 g 50 mL, 2 g, Intravenous, Once, Jessica U Vann, DO, 2 g at 06/13/15 1235 . mupirocin ointment (BACTROBAN) 2 % 1 application, 1 application, Nasal, BID, Joseph Art, DO, 1 application at 06/13/15 1111 . ondansetron (ZOFRAN) tablet 4 mg, 4 mg, Oral, Q6H PRN **OR** ondansetron (ZOFRAN) injection 4 mg, 4 mg, Intravenous, Q6H PRN, Rhona Raider Stinson, DO . oxyCODONE (Oxy IR/ROXICODONE) immediate release tablet 5 mg, 5 mg, Oral, Q4H PRN, Rhona Raider Stinson, DO, 5 mg at 06/09/15 9604 . pravastatin (PRAVACHOL) tablet 40 mg, 40 mg, Oral, QHS, Rhona Raider Stinson, DO, 40 mg at 06/12/15 2113 . senna-docusate (Senokot-S) tablet 1 tablet, 1 tablet, Oral, QHS PRN, Levie Heritage, DO, 1 tablet at 06/11/15 2126 . sodium chloride 0.9 % injection 3 mL, 3 mL, Intravenous, Q12H, Rhona Raider Stinson, DO, 3 mL at 06/12/15 2114 . vancomycin (VANCOCIN) IVPB 750 mg/150 ml premix, 750 mg, Intravenous,  Q12H, Robinette Haines, RPH, 750 mg at 06/13/15 0400  Patients Current Diet: Diet heart healthy/carb modified Room service appropriate?: Yes; Fluid consistency:: Thin  Precautions / Restrictions Precautions Precautions: Fall Restrictions Weight Bearing Restrictions: No   Has the patient had 2 or more falls or a fall with injury in the past year?No  Prior Activity Level Went out daily up until 3-4 weeks ago. 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   Home Assistive Devices / Equipment Home Assistive Devices/Equipment: Dentures (specify type), CBG Meter Home Equipment: Cane - single point  Prior Device Use: Indicate devices/aids used by the patient prior to current illness, exacerbation or injury? None of the above. Used no device, completely independent.  Prior Functional Level Prior Function Level of Independence: Independent  Self Care: Did the patient need help bathing, dressing, using the toilet or eating? Independent  Indoor Mobility: Did the patient need assistance with walking from room to room (with or without device)? Independent  Stairs: Did the patient need assistance with internal or external stairs (with or without device)? Independent  Functional Cognition: Did the patient need help planning regular tasks such as shopping or remembering to take medications? Independent  Current Functional Level Cognition  Overall Cognitive Status: Within Functional Limits for tasks assessed Orientation Level: Oriented X4   Extremity Assessment (includes Sensation/Coordination)  Upper Extremity Assessment: RUE deficits/detail RUE Deficits / Details: Full AROM, decreased strength 3+/5 shoulder (per son pt has rotator cuff issues on this side, rest 4/5  Lower Extremity Assessment: Generalized weakness    ADLs  Overall ADL's : Needs assistance/impaired Eating/Feeding: Independent (  supported sitting) Grooming: Set up (supported sitting) Upper Body Bathing: Set up  (supported sitting) Lower Body Bathing: Moderate assistance (with min A sit<>stand and to maintain standing) Upper Body Dressing : Minimal assistance (supported sitting) Lower Body Dressing: Maximal assistance (with min A sit<>stand and to maintain standing) Toilet Transfer: Minimal assistance, Stand-pivot, BSC Toileting- Clothing Manipulation and Hygiene: Moderate assistance (with min A sit<>stand and to maintain standing)    Mobility  Overal bed mobility: Needs Assistance Bed Mobility: Supine to Sit, Sit to Supine Supine to sit: Min assist, HOB elevated Sit to supine: Min guard General bed mobility comments: Assist to bring trunk up. Pt with posterior lean.    Transfers  Overall transfer level: Needs assistance Equipment used: 1 person hand held assist Transfers: Sit to/from Stand Sit to Stand: Min assist General transfer comment: increased time and cues to correct posterior lean    Ambulation / Gait / Stairs / Wheelchair Mobility  Ambulation/Gait Ambulation/Gait assistance: Mod assist, +2 safety/equipment Ambulation Distance (Feet): 40 Feet (x 2) Assistive device: Rolling walker (2 wheeled) General Gait Details: Pt with posterior lean. Verbal cues to look up. Gait Pattern/deviations: Step-through pattern, Decreased step length - right, Decreased step length - left, Leaning posteriorly, Shuffle Gait velocity: decr Gait velocity interpretation: Below normal speed for age/gender    Posture / Balance Dynamic Sitting Balance Sitting balance - Comments: posterior lean with VCs to reach forward below knees to correct lean Balance Overall balance assessment: Needs assistance Sitting-balance support: Feet supported, Bilateral upper extremity supported Sitting balance-Leahy Scale: Fair Sitting balance - Comments: posterior lean with VCs to reach forward below knees to correct lean Postural control: Posterior lean Standing balance support: Bilateral upper extremity  supported Standing balance-Leahy Scale: Poor Standing balance comment: min A for posterior lean with increased time and cues to correct    Special needs/care consideration BiPAP/CPAP No CPM No Continuous Drip IV No Dialysis No  Life Vest No Oxygen No Special Bed No Trach Size No Wound Vac (area) No  Skin Injured right shoulder recently, but no skin issues  Bowel mgmt: Last BM 06/10/15 Bladder mgmt: Condom catheter in place, some incontinence. Diabetic mgmt Yes, or oral medications at home.    Previous Home Environment Living Arrangements: Spouse/significant other, Other relatives (grand-daughter and 71 year old great-granddaughter) Available Help at Discharge: Available 24 hours/day Type of Home: Mobile home Home Layout: One level Home Access: Stairs to enter Entrance Stairs-Rails: Left, Right, Can reach both Entrance Stairs-Number of Steps: 4 Home Care Services: No  Discharge Living Setting Plans for Discharge Living Setting: Patient's home, Mobile Home Type of Home at Discharge: Mobile home (Single wide mobile home.) Discharge Home Layout: One level Discharge Home Access: Stairs to enter Entrance Stairs-Number of Steps: 4 steps Does the patient have any problems obtaining your medications?: No  Social/Family/Support Systems Patient Roles: Spouse, Parent (Has a wife and a son.) Contact Information: Theressa MillardLinda Demarinis - wife Anticipated Caregiver: wife Anticipated Caregiver's Contact Information: Bonita QuinLinda (h) [phone] ([phone]) [phone] Ability/Limitations of Caregiver: Wife is retired and can Geneticist, molecularassist Caregiver Availability: 24/7 Discharge Plan Discussed with Primary Caregiver: Yes Is Caregiver In Agreement with Plan?: Yes Does Caregiver/Family have Issues with Lodging/Transportation while Pt is in Rehab?: No  Goals/Additional Needs Patient/Family Goal for Rehab: PT/OT/ST mod I goals Expected length of stay: 7-10 days Cultural  Considerations: Attends [employer] Dietary Needs: Dys 3, thin liquids Equipment Needs: TBD Pt/Family Agrees to Admission and willing to participate: Yes Program Orientation Provided & Reviewed with Pt/Caregiver Including  Roles & Responsibilities: Yes  Decrease burden of Care through IP rehab admission: N/A  Possible need for SNF placement upon discharge: Not anticipated  Patient Condition: This patient's condition remains as documented in the consult dated 06/11/15, in which the Rehabilitation Physician determined and documented that the patient's condition is appropriate for intensive rehabilitative care in an inpatient rehabilitation facility. Will admit to inpatient rehab today.  Preadmission Screen Completed By: Trish Mage, 06/13/2015 1:28 PM ______________________________________________________________________  Discussed status with Dr. Riley Kill on 06/13/15 at 1329 and received telephone approval for admission today.  Admission Coordinator: Trish Mage, time1329/Date06/29/16          Cosigned by: [name], MD at 06/13/2015 2:52 PM  Revision History     Date/Time User Provider Type Action   06/13/2015 2:52 PM [name], MD Physician Cosign   06/13/2015 1:29 PM Trish Mage, RN Rehab Admission Coordinator Sign

## 2015-06-14 NOTE — Progress Notes (Signed)
Social Work Assessment and Plan Social Work Assessment and Plan  Patient Details  Name: Jacob White MRN: [id number] Date of Birth: 06/19/1946  Today's Date: 06/14/2015  Problem List:  Patient Active Problem List   Diagnosis Date Noted  . Stroke, acute, thrombotic, within last 8 weeks 06/13/2015  . Left hemiparesis   . Poorly controlled type 2 diabetes mellitus   . Cerebral thrombosis with cerebral infarction 06/11/2015  . MRSA bacteremia 06/11/2015  . Bacteremia 06/09/2015  . Weakness generalized 06/08/2015  . Confusion 06/08/2015  . Dehydration 06/08/2015  . Diabetes mellitus without complication 06/08/2015  . Pain of right upper extremity 06/08/2015   Past Medical History:  Past Medical History  Diagnosis Date  . Diabetes mellitus without complication   . Hypertension   . Hyperlipemia    Past Surgical History:  Past Surgical History  Procedure Laterality Date  . Tee without cardioversion N/A 06/12/2015    Procedure: TRANSESOPHAGEAL ECHOCARDIOGRAM (TEE);  Surgeon: Chrystie NoseKenneth C Hilty, MD;  [facility];  Service: Cardiovascular;  Laterality: N/A;   Social History:  reports that he has never smoked. He does not have any smokeless tobacco history on file. He reports that he does not drink alcohol. His drug history is not on file.  Family / Support Systems Marital Status: Married Patient Roles: Spouse, Parent, Other (Comment) (Works part time as Artistupholster) Spouse/Significant Other: Jacob QuinLinda  [phone]-home  [phone]-cell Children: Jacob White-son  [phone]-cell Other Supports: grandaughter whom lives with them Anticipated Caregiver: Wife Ability/Limitations of Caregiver: Wife is in good health and can assist Caregiver Availability: 24/7 Family Dynamics: Close knit family they have a son who is local and supportive.  They have a grandaughter whom lives wiht them along with her 283 yo child-couples great grandchild. All rely upon one another and when needed will be there for each  other.  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 Will look toward his wife if any decisions need to be made while here.   Abuse/Neglect Physical Abuse: Denies Verbal Abuse: Denies Sexual Abuse: Denies Exploitation of patient/patient's resources: Denies Self-Neglect: Denies  Emotional Status Pt's affect, behavior adn adjustment status: Pt is motivated and one that is always busy, not one to sit still.  He wants to regain his independence before he leaves here. His family is very supportive and will assist if needed. He doesn't want them too.  He plans to remain active after this is all over. Recent Psychosocial Issues: health issues but thought he was doing well with them Pyschiatric History: No history deferred depression screen due to pt feels he is doing ok and does not feel the need to talk with anyone. Will monitor and have neuro-psych see while here.  Family feels he is doing well and in agreement with him. Substance Abuse History: No issues  Patient / Family Perceptions, Expectations & Goals Pt/Family understanding of illness & functional limitations: Pt and wife have a good understanding of his condition, family is here daily and asks the questions they have.  All feel they have a good understanding of pt's condition.  All are hopeful he will do well here. Premorbid pt/family roles/activities: Husband, father, grandfather, retiree, church member, etc Anticipated changes in roles/activities/participation: resume Pt/family expectations/goals: Pt states: " I  want to be able to take care of myself, like I always have."  Wife states: " We will do  whatever he needs [place] to do, but knowing him he will want to do on his own."  [employer]: None Premorbid Home Care/DME Agencies: None Transportation available at discharge: [employer] referrals recommended: Support group (specify)  Discharge Planning Living Arrangements: Spouse/significant other, Other relatives Support Systems: Spouse/significant other, Children, Other relatives, Friends/neighbors, Church/faith community Type of Residence: Private residence Insurance Resources: [employer] Resources: Tree surgeon, Other (Comment) (part time employment) Financial Screen Referred: No Living Expenses: Own Money Management: Spouse, Patient Does the patient have any problems obtaining your medications?: No Home Management: Wife does inside work and pt does outside work Associate Professor Plans: Return home with wife and grandaughter who are willing to assist if needed.  His son is also involved.  Only concern is medication cost for IV meds, will check into if they have prescription coverage, but probably do not. Will work with on discharge plans. 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 Family is very supportive and willing to assist him. Pt to have IV antibiotic until 7/11, only concern is cost of This medicine. Will work on exploring if pt has medication coverage and work with family on discharge plans. He does have a PCP and placed MD in his chart.  Lucy Chris 06/14/2015, 11:17 AM

## 2015-06-14 NOTE — Evaluation (Signed)
Physical Therapy Assessment and Plan  Patient Details  Name: Jacob White MRN: [id number] Date of Birth: 1946-03-30  PT Diagnosis: Abnormal posture, Cognitive deficits, Difficulty walking, Dizziness and giddiness, Impaired sensation, Muscle weakness and Osteoarthritis Rehab Potential: Good ELOS: 5-7 days   Today's Date: 06/14/2015 PT Individual Time: 1100-1210 Treatment Session 2: [id number] PT Individual Time Calculation (min): 70 min  Treatment Session 2: 25 min  Problem List:  Patient Active Problem List   Diagnosis Date Noted  . Stroke, acute, thrombotic, within last 8 weeks 06/13/2015  . Left hemiparesis   . Poorly controlled type 2 diabetes mellitus   . Cerebral thrombosis with cerebral infarction 06/11/2015  . MRSA bacteremia 06/11/2015  . Bacteremia 06/09/2015  . Weakness generalized 06/08/2015  . Confusion 06/08/2015  . Dehydration 06/08/2015  . Diabetes mellitus without complication 18/84/1660  . Pain of right upper extremity 06/08/2015    Past Medical History:  Past Medical History  Diagnosis Date  . Diabetes mellitus without complication   . Hypertension   . Hyperlipemia    Past Surgical History:  Past Surgical History  Procedure Laterality Date  . Tee without cardioversion N/A 06/12/2015    Procedure: TRANSESOPHAGEAL ECHOCARDIOGRAM (TEE);  Surgeon: Pixie Casino, MD;  Location: [facility] ENDOSCOPY;  Service: Cardiovascular;  Laterality: N/A;    Assessment & Plan Clinical Impression: Jacob White is a 69 y.o. male admitted to [facility] on 06/08/2015 after seeing his primary care physician for right shoulder pain. The patient injured his shoulder while trying to move a picnic table while sitting on his riding mower. During that visit the patient's wife reported that the patient has been very lethargic recently and has been increasingly confused over the past 3-4 weeks. MRI of the brain revealed acute, small infarcts in the right frontal and parietal cortex  as well prior chronic infarcts. Neurology feels infarcts likely due to chronic small vessel disease and to rule out infectious source. ASA added for secondary stroke prevention. Hospital course complicated by MRSA bacteremia and patient placed on IV vancomycin for treatment.   Dr. Linus Salmons consulted for input and recommended full work up to evaluate source of bacteremia. Repeat blood cultures done on 06/27 and pending. MRI of the lumbar spine shows fluid within L3/4 and L4/5 facet joints likely due to degenerative changes and mild edema along psoas and posterior aspect of iliacus muscle likely due to third spacing or spread of infection. MRI right shoulder shows diffuse edema in interstitial spaces likely due to anasarca, rotator cuff degeneration and no signs of septic arthritis. 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 BLE dopplers negative for DVT. Repeat cultures negative so far and to complete 2 week course (from 06/28) of IV antibiotics per ID. Pt has regressed from a functional standpoint since admission when he was min assist with PT.  Patient transferred to CIR on 06/13/2015 .   Patient currently requires min with mobility secondary to muscle weakness, decreased cardiorespiratoy endurance, decreased coordination, decreased visual acuity, decreased problem solving and decreased memory and decreased sitting balance, decreased standing balance, decreased postural control and monoplegia.  Prior to hospitalization, patient was independent  with mobility and lived with Spouse in a Mobile home home.  Home access is 4Stairs to enter.  Patient will benefit from skilled PT intervention to maximize safe functional mobility, minimize fall risk and decrease caregiver burden for planned discharge home with 24 hour supervision.  Anticipate patient will benefit from follow  up [place] at discharge.  PT - End of Session Activity Tolerance: Tolerates 10 - 20 min activity  with multiple rests Endurance Deficit: Yes Endurance Deficit Description: muscular endurance and cardiorespiratory endurance deficit PT Assessment Rehab Potential (ACUTE/IP ONLY): Good Barriers to Discharge: Inaccessible home environment PT Patient demonstrates impairments in the following area(s): Balance;Safety;Endurance;Sensory;Motor;Nutrition (cachectic looking) PT Transfers Functional Problem(s): Bed Mobility;Bed to Chair;Car;Furniture PT Locomotion Functional Problem(s): Ambulation;Wheelchair Mobility;Stairs PT Plan PT Intensity: Minimum of 1-2 x/day ,45 to 90 minutes PT Frequency: 5 out of 7 days PT Duration Estimated Length of Stay: 5-7 days PT Treatment/Interventions: Ambulation/gait training;Cognitive remediation/compensation;Discharge planning;DME/adaptive equipment instruction;Functional mobility training;Pain management;Psychosocial support;Splinting/orthotics;Therapeutic Activities;UE/LE Strength taining/ROM;Visual/perceptual remediation/compensation;Balance/vestibular training;Community reintegration;Disease management/prevention;Neuromuscular re-education;Patient/family education;Skin care/wound management;Stair training;Therapeutic Exercise;UE/LE Coordination activities;Wheelchair propulsion/positioning PT Transfers Anticipated Outcome(s): Mod I PT Locomotion Anticipated Outcome(s): Mod I ambulation PT Recommendation Follow Up Recommendations: Home health PT;24 hour supervision/assistance Patient destination: Home Equipment Recommended: To be determined Equipment Details: Pt has no equipment  Skilled Therapeutic Intervention Treatment Session 1: PT Evaluation: PT evaluation initiated - pt presents with generalized weakness and deconditioning as well as L UE slight monoplegia, resulting in impaired standing balance. Pt also has premorbid arthritis, resulting in limited L cervical rotation and L trunk side flexion, as well as reporting a 40# weight loss over the past month  with occasional night sweats. PT notified RN of the weight loss and night sweats and asked her to notify physician/PA for follow up.   Therapeutic Activity: Pt received up in easy chair beside bed, req min A to transfer with armrests chair to/from bed, mod I sit to supine and rolling R/L in flat bed without rail (req increased time), but req SBA for supine to sit. Pt req min A for sit to stand multiple times from w/c.   Gait Training: PT instructs pt in ambulation with RW x 200' req CGA for safety - PT notes L knee lacks terminal extension from premorbid old softball injury ~40 years ago, as well as slight crouched posture and slow speed during gait.   W/C Management: PT instructs pt in w/c propulsion with B UEs x 200' req verbal cues for technique and encouragement for this distance. Pt req assist for w/c parts management.   Treatment Session 2: Gait Training: PT instructs pt in ascending/descending 4 (6" height) stairs with B handrails req min A.   W/C Management: PT adjusts legrest length to be symmetrical and provide even pressure distribution from glute max to distal posterior thigh. PT obtains a Merchandiser, retail for w/c, as well.   Pt will benefit from IPR PT. Continue per PT POC.    PT Evaluation Precautions/Restrictions Precautions Precautions: Fall Restrictions Weight Bearing Restrictions: No General Chart Reviewed: Yes Family/Caregiver Present: Yes (wife Vaughan Basta) Vital SignsTherapy Vitals Temp: 97.6 F (36.4 C) Temp Source: Oral Pulse Rate: 88 BP: 98/74 mmHg Patient Position (if appropriate): Sitting Oxygen Therapy SpO2: 98 % O2 Device: Not Delivered Pain Pain Assessment Pain Assessment: No/denies pain Pain Score: 0-No pain  Treatment Session 2: Pt denies pain.  Home Living/Prior Functioning Home Living Living Arrangements: Spouse/significant other;Other relatives Available Help at Discharge: Available 24 hours/day Type of Home: Mobile home Home Access:  Stairs to enter Entrance Stairs-Number of Steps: 4 Entrance Stairs-Rails: Left;Right;Can reach both Home Layout: One level Additional Comments: recently hurt R shoulder trying to move a picnic table while on the riding lawnmower  Lives With: Spouse Prior Function Level of Independence: Independent with transfers;Independent with gait  Able to Take Stairs?: Yes  Driving: Yes Vocation: Part time employment Vocation Requirements: Air traffic controller Vision/Perception    Pt reports increase in already blurred baseline vision over the past few weeks.  Cognition Overall Cognitive Status: Impaired/Different from baseline Arousal/Alertness: Awake/alert Orientation Level: Oriented to person;Oriented to place;Disoriented to time;Oriented to situation Attention: Focused;Sustained Focused Attention: Appears intact Sustained Attention: Appears intact Selective Attention: Appears intact Memory: Impaired (3/4 words recalled) Memory Impairment: Decreased recall of new information Awareness: Appears intact Problem Solving: Impaired Problem Solving Impairment: Functional basic Safety/Judgment: Appears intact Sensation Sensation Light Touch: Impaired Detail Light Touch Impaired Details: Impaired RLE;Impaired LLE Stereognosis: Not tested Hot/Cold: Not tested Proprioception: Appears Intact Additional Comments: toes intermittently tingly/burning from peripheral neuropathy Coordination Gross Motor Movements are Fluid and Coordinated: Yes Fine Motor Movements are Fluid and Coordinated: Not tested Finger Nose Finger Test: L finger has slightly more ataxia near end point Heel Shin Test: wfl B LEs  Motor  Motor Motor: Other (comment) Motor - Skilled Clinical Observations: slight L UE monoplegia  Mobility Bed Mobility Bed Mobility: Rolling Right;Rolling Left;Left Sidelying to Sit;Sit to Supine Rolling Right: 6: Modified independent (Device/Increase time) Rolling Left: 6: Modified independent  (Device/Increase time) Left Sidelying to Sit: 5: Supervision Left Sidelying to Sit Details: Verbal cues for precautions/safety Sit to Supine: 5: Supervision Sit to Supine - Details: Verbal cues for precautions/safety Transfers Transfers: Yes Sit to Stand: 4: Min assist;From bed Sit to Stand Details: Manual facilitation for placement Stand to Sit: 4: Min guard Stand Pivot Transfers: 4: Min Psychologist, occupational Details: Manual facilitation for placement;Verbal cues for precautions/safety Locomotion  Ambulation Ambulation: Yes Ambulation/Gait Assistance: 4: Min guard Ambulation Distance (Feet): 200 Feet Assistive device: Rolling walker Gait Gait: Yes Gait Pattern: Impaired Gait Pattern: Left flexed knee in stance (genral slouched posture - slightly crouched) Gait velocity: slow Wheelchair Mobility Wheelchair Mobility: Yes Wheelchair Assistance: 5: Investment banker, operational Details: Verbal cues for Marketing executive: Both upper extremities Wheelchair Parts Management: Needs assistance Distance: 200  Trunk/Postural Assessment  Cervical Assessment Cervical Assessment: Exceptions to [facility] Cervical AROM Overall Cervical AROM: Deficits;Due to premorid status Overall Cervical AROM Comments: extremely limited L cervical side flexion Thoracic Assessment Thoracic Assessment: Within Functional Limits Lumbar Assessment Lumbar Assessment: Exceptions to [facility] Lumbar AROM Overall Lumbar AROM: Deficits;Due to premorid status Overall Lumbar AROM Comments: limited L trunk side flexion Postural Control Postural Control: Deficits on evaluation Postural Limitations: premorbid forward head  Balance Balance Balance Assessed: Yes Static Sitting Balance Static Sitting - Balance Support: Feet supported;Left upper extremity supported;Right upper extremity supported Static Sitting - Level of Assistance: 5: Stand by assistance Dynamic Sitting Balance Dynamic Sitting -  Balance Support: Feet supported;Left upper extremity supported;Right upper extremity supported Dynamic Sitting - Level of Assistance: 5: Stand by assistance Dynamic Sitting - Balance Activities: Lateral lean/weight shifting;Forward lean/weight shifting Static Standing Balance Static Standing - Balance Support: During functional activity;No upper extremity supported Static Standing - Level of Assistance: 4: Min assist Dynamic Standing Balance Dynamic Standing - Balance Support: During functional activity;No upper extremity supported Dynamic Standing - Level of Assistance: 4: Min assist Dynamic Standing - Balance Activities: Lateral lean/weight shifting;Forward lean/weight shifting Extremity Assessment  RUE Assessment RUE Assessment: Within Functional Limits LUE Assessment LUE Assessment: Exceptions to WFL LUE AROM (degrees) Overall AROM Left Upper Extremity: Within functional limits for tasks assessed LUE Strength LUE Overall Strength: Deficits LUE Overall Strength Comments: grossly 4+/5, except triceps 5/5 RLE Assessment RLE Assessment: Within Functional Limits LLE Assessment LLE Assessment: Within Functional Limits  FIM:  FIM - Locomotion: Wheelchair Distance: 200 FIM - Locomotion: Ambulation Ambulation/Gait Assistance: 4: Min guard   Refer to Care Plan for Long Term Goals  Recommendations for other services: None  Discharge Criteria: Patient will be discharged from PT if patient refuses treatment 3 consecutive times without medical reason, if treatment goals not met, if there is a change in medical status, if patient makes no progress towards goals or if patient is discharged from hospital.  The above assessment, treatment plan, treatment alternatives and goals were discussed and mutually agreed upon: by patient  [facility] M 06/14/2015, 12:20 PM

## 2015-06-14 NOTE — Progress Notes (Signed)
Initial Nutrition Assessment  DOCUMENTATION CODES:  Severe malnutrition in context of chronic illness   Pt meets criteria for SEVERE MALNUTRITION in the context of chronic illness as evidenced by severe fat and muscle mass loss.  INTERVENTION:  Ensure Enlive (each supplement provides 350kcal and 20 grams of protein) (BID)   Encourage adequate PO intake.   NUTRITION DIAGNOSIS:  Increased nutrient needs related to  (therapy) as evidenced by estimated needs.  GOAL:  Patient will meet greater than or equal to 90% of their needs  MONITOR:  PO intake, Supplement acceptance, Weight trends, Labs, I & O's  REASON FOR ASSESSMENT:  Malnutrition Screening Tool    ASSESSMENT: Pt [facility]2015 after seeing his primary care physician for right shoulder pain. wife reported that the patient has been very lethargic recently and has been increasingly confused over the past 3-4 weeks. 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  Pt reports his appetite has been improving. Meal completion has been 50-100%. Pt reports the 3 weeks PTA he has had a decreased with very little po intake. Usual body weight reported to be ~175 lbs, which he reported last weighing last year. Pt with a 25% weight loss, however per Epic weight records, no weight history to confirm. Pt currently has Ensure ordered and has been consuming them. RD to continue with current orders. Pt was encouraged to eat his food at meals and to drink his supplements.   Nutrition-Focused physical exam completed. Findings are severe fat depletion, severe muscle depletion, and no edema.   Labs: Low sodium, potassium, chloride, and calcium. High AST.  Height:  Ht Readings from Last 1 Encounters:  06/13/15 5\' 6"  (1.676 m)    Weight:  Wt Readings from Last 1 Encounters:  06/13/15 131 lb 6.3 oz (59.6 kg)    Ideal Body Weight:   64.54 kg  Wt Readings from Last 10 Encounters:  06/13/15 131 lb 6.3 oz (59.6 kg)  06/09/15 134 lb 8 oz (61.009 kg)    BMI:  Body mass index is 21.22 kg/(m^2).  Estimated Nutritional Needs:  Kcal:  1800-2000  Protein:  75-90 grams  Fluid:  1.8 - 2 L/day  Skin:  Reviewed, no issues  Diet Order:  Diet heart healthy/carb modified Room service appropriate?: Yes; Fluid consistency:: Thin  EDUCATION NEEDS:  No education needs identified at this time   Intake/Output Summary (Last 24 hours) at 06/14/15 1450 Last data filed at 06/14/15 1300  Gross per 24 hour  Intake    480 ml  Output   1350 ml  Net   -870 ml    Last BM:  6/29  Roslyn SmilingStephanie Gene Colee, MS, RD, LDN Pager # [phone] After hours/ weekend pager # [phone]

## 2015-06-14 NOTE — Evaluation (Signed)
Speech Language Pathology Assessment and Plan  Patient Details  Name: Jacob White MRN: [id number] Date of Birth: [birth year]  SLP Diagnosis: Cognitive Impairments  Rehab Potential: Good ELOS: 7 days     Today's Date: 06/14/2015 SLP Individual Time: 0800-0900 SLP Individual Time Calculation (min): 60 min   Problem List:  Patient Active Problem List   Diagnosis Date Noted  . Stroke, acute, thrombotic, within last 8 weeks 06/13/2015  . Left hemiparesis   . Poorly controlled type 2 diabetes mellitus   . Cerebral thrombosis with cerebral infarction 06/11/2015  . MRSA bacteremia 06/11/2015  . Bacteremia 06/09/2015  . Weakness generalized 06/08/2015  . Confusion 06/08/2015  . Dehydration 06/08/2015  . Diabetes mellitus without complication 34/74/2595  . Pain of right upper extremity 06/08/2015   Past Medical History:  Past Medical History  Diagnosis Date  . Diabetes mellitus without complication   . Hypertension   . Hyperlipemia    Past Surgical History:  Past Surgical History  Procedure Laterality Date  . Tee without cardioversion N/A 06/12/2015    Procedure: TRANSESOPHAGEAL ECHOCARDIOGRAM (TEE);  Surgeon: Pixie Casino, MD;  Location: [facility] ENDOSCOPY;  Service: Cardiovascular;  Laterality: N/A;    Assessment / Plan / Recommendation Clinical Impression  Jacob White is a 69 y.o. male admitted to [facility] on 06/08/2015 after seeing his primary care physician for right shoulder pain. The patient injured his shoulder while trying to move a picnic table while sitting on his riding mower. During that visit the patient's wife reported that the patient has been very lethargic recently and has been increasingly confused over the past 3-4 weeks. 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 Pt admitted to CIR on 06/13/2015.  SLP evaluation completed on 06/14/2015 with the following results:  Pt presents  with overall mild cognitive deficits s/p acute infarcts given age and likely baseline deficits due to history of small vessel disease and chronic infarcts.  Pt is mildly disoriented to exact date but was oriented to place and situation.  He sustained his attention to tasks appropriately but was noted to have delayed processing speed and diminished initiation of functional tasks.  Pt was also noted with memory deficits for both immediate and delayed recall as well as for working memory during functional tasks.  Working memory impairments and delayed processing speed appeared to contribute to functional problem solving deficits noted on evaluation measures.  Wife reports pt to be altered from cognitive baseline. Pt was also administered a bedside swallowing evaluation due to recent diet upgrade and presents with s/s of grossly intact swallowing function when consuming regular textures and thin liquids.  Pt demonstrated age-appropriate mastication of solid textures which resulted in complete clearance of residual solids from the oral cavity post swallow.  No overt s/s of aspiration were evident with solid or liquid consistencies.  Recommend that pt remain on a regular diet with thin liquids with intermittent supervision for tray set up and use of standard safe swallowing strategies (i.e. Slow rate, small bites/sips, upright as possible for meals).   Pt was independent, working, and very active prior to admission.  The abovementioned deficits impact his ability to complete familiar self care and home management tasks safely and, as a result, pt would benefit from skilled ST while inpatient in order to maximize functional independence and reduce burden of care prior to discharge.  Anticipate that pt will need 24/7 supervision prior  to discharge and potential ST follow up to address cognition.  Will update discharge recommendations as appropriate.     Skilled Therapeutic Interventions          Cognitive-linguistic and  bedside swallowing evaluation completed with results and recommendations reviewed with family.   *Of note, during evaluation pt became somewhat confused and less responsive.  His breathing became slightly labored and he complained of dizziness.  Vital signs were taken: BP 90/63, Pulse 92, O2 sats 95.  RN made aware and was present at bedside at the end of today's evaluation.  Entire episode lasted approximately 2 minutes and pt reported symptoms resolving.      SLP Assessment  Patient will need skilled Speech Lanaguage Pathology Services during CIR admission    Recommendations  SLP Diet Recommendations: Thin;Age appropriate regular solids Liquid Administration via: Cup;Straw Medication Administration: Whole meds with liquid Supervision: Patient able to self feed;Intermittent supervision to cue for compensatory strategies Compensations: Slow rate;Small sips/bites;Check for pocketing;Follow solids with liquid Postural Changes and/or Swallow Maneuvers: Out of bed for meals Oral Care Recommendations: Oral care BID Patient destination: Home Follow up Recommendations: Home Health SLP;24 hour supervision/assistance Equipment Recommended: None recommended by SLP    SLP Frequency 3 to 5 out of 7 days   SLP Treatment/Interventions Cognitive remediation/compensation;Cueing hierarchy;Functional tasks;Internal/external aids;Environmental controls;Patient/family education    Pain Pain Assessment Pain Assessment: No/denies pain Prior Functioning Cognitive/Linguistic Baseline: Within functional limits Type of Home: Mobile home  Lives With: Spouse Available Help at Discharge: Available 24 hours/day Education: 9th grade  Vocation: Part time employment  Short Term Goals: Week 1: SLP Short Term Goal 1 (Week 1): Pt will utilize environmental aids to reorient to date with supervision.  SLP Short Term Goal 2 (Week 1): Pt will utilize external aids to facilitate recall of daily information with min  verbal and visual cues.   SLP Short Term Goal 3 (Week 1): Pt will complete basic self care and/or home management tasks with min verbal cues for functional problem solving.    See FIM for current functional status Refer to Care Plan for Long Term Goals  Recommendations for other services: None  Discharge Criteria: Patient will be discharged from SLP if patient refuses treatment 3 consecutive times without medical reason, if treatment goals not met, if there is a change in medical status, if patient makes no progress towards goals or if patient is discharged from hospital.  The above assessment, treatment plan, treatment alternatives and goals were discussed and mutually agreed upon: by patient and by family  Emilio Math 06/14/2015, 9:37 AM

## 2015-06-14 NOTE — Care Management Note (Signed)
[facility] Individual Statement of Services  Patient Name:  Jacob White  Date:  06/14/2015  Welcome to the [facility].  Our goal is to provide you with an individualized program based on your diagnosis and situation, designed to meet your specific needs.  With this comprehensive rehabilitation program, you will be expected to participate in at least 3 hours of rehabilitation therapies Monday-Friday, with modified therapy programming on the weekends.  Your rehabilitation program will include the following services:  Physical Therapy (PT), Occupational Therapy (OT), Speech Therapy (ST), 24 hour per day rehabilitation nursing, Therapeutic Recreaction (TR), Case Management (Social Worker), Rehabilitation Medicine, Nutrition Services and Pharmacy Services  Weekly team conferences will be held on Wednesday to discuss your progress.  Your Social Worker will talk with you frequently to get your input and to update you on team discussions.  Team conferences with you and your family in attendance may also be held.  Expected length of stay: 5-7 days Overall anticipated outcome: mod/i-supervision level  Depending on your progress and recovery, your program may change. Your Social Worker will coordinate services and will keep you informed of any changes. Your Social Worker's name and contact numbers are listed  below.  The following services may also be recommended but are not provided by the [facility]:   Driving Evaluations  Home Health Rehabiltiation Services  Outpatient Rehabilitation Services    Arrangements will be made to provide these services after discharge if needed.  Arrangements include referral to agencies that provide these services.  Your insurance has been verified to be:  Medicare Your primary doctor is:  Alyce PaganCheryl Wells-NP  Pertinent information will be shared with your doctor and your insurance company.  Social  Worker:  Dossie DerBecky Arlen Dupuis, SW [phone] or [phone]) [phone]  Information discussed with and copy given to patient by: Lucy Chrisupree, Orton Capell G, 06/14/2015, 9:55 AM

## 2015-06-14 NOTE — Progress Notes (Signed)
[name], MD Physician Signed Physical Medicine and Rehabilitation Consult Note 06/11/2015 3:35 PM  Related encounter: ED to Hosp-Admission (Discharged) from 06/08/2015 in [facility] 5W MEDICAL    Expand All Collapse All        Physical Medicine and Rehabilitation Consult Reason for Consult: weakness Referring Physician: Benjamine Mola   HPI: Jacob White is a 69 y.o. male admitted to [facility] on 06/08/2015 after seeing his primary care physician for right shoulder pain. The patient injured his shoulder while trying to move a picnic table while sitting on his riding mower. During that visit the patient's wife reported that the patient has been very lethargic recently and has been increasingly confused over the past 3-4 weeks. 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 Hospital course complicated by MRSA bacteremia. MRI of the lumbar spine and right shoulder have been ordered to assess source of bacteremia/pain. Pt is currently on IV vancomycin. Pt has regressed from a functional standpoint since admission when he was min assist with PT. Therefore a formal rehab consult is being completed.    Review of Systems  Constitutional: Negative for chills.  Eyes: Negative for blurred vision.  Respiratory: Negative for cough.  Cardiovascular: Negative for chest pain and palpitations.  Gastrointestinal: Negative for nausea and vomiting.  Genitourinary: Negative for dysuria.  Musculoskeletal: Positive for myalgias, back pain and joint pain.  Skin: Negative for itching.  Neurological: Positive for dizziness and focal weakness. Negative for headaches.  Psychiatric/Behavioral: Negative for depression.   Past Medical History  Diagnosis Date  . Diabetes mellitus without complication   . Hypertension   . Hyperlipemia    History reviewed. No pertinent past surgical history. History  reviewed. No pertinent family history. Social History:  reports that he has never smoked. He does not have any smokeless tobacco history on file. He reports that he does not drink alcohol. His drug history is not on file. Allergies: No Known Allergies Medications Prior to Admission  Medication Sig Dispense Refill  . lisinopril-hydrochlorothiazide (PRINZIDE,ZESTORETIC) 20-25 MG per tablet Take 1 tablet by mouth daily.    . meloxicam (MOBIC) 15 MG tablet Take 15 mg by mouth daily as needed for pain.     . metFORMIN (GLUCOPHAGE) 1000 MG tablet Take 1,000 mg by mouth 2 (two) times daily with a meal.    . Omega-3 Fatty Acids (OMEGA 3 PO) Take 1 tablet by mouth daily.    Marland Kitchen oxyCODONE-acetaminophen (PERCOCET/ROXICET) 5-325 MG per tablet Take 1 tablet by mouth daily as needed for moderate pain or severe pain.     . pravastatin (PRAVACHOL) 40 MG tablet Take 40 mg by mouth daily.      Home: Home Living Family/patient expects to be discharged to:: Private residence Living Arrangements: Spouse/significant other, Other relatives (grand-daughter and 74 year old great-granddaughter) Available Help at Discharge: Available 24 hours/day Type of Home: Mobile home Home Access: Stairs to enter [employer] of Steps: 4 Entrance Stairs-Rails: Left, Right, Can reach both Home Layout: One level Home Equipment: Cane - single point  Functional History: Prior Function Level of Independence: Independent Functional Status:  Mobility: Bed Mobility Overal bed mobility: Needs Assistance Bed Mobility: Supine to Sit Supine to sit: HOB elevated, Min assist General bed mobility comments: Assist to bring trunk up. Pt with posterior lean. Transfers Overall transfer level: Needs assistance Equipment used: Rolling walker (2 wheeled) Transfers: Sit to/from Stand Sit to  Stand: Min assist General transfer comment: Assist to bring hips up and with posterior  lean. Ambulation/Gait Ambulation/Gait assistance: Mod assist, +2 safety/equipment Ambulation Distance (Feet): 40 Feet (x 2) Assistive device: Rolling walker (2 wheeled) General Gait Details: Pt with posterior lean. Verbal cues to look up. Gait Pattern/deviations: Step-through pattern, Decreased step length - right, Decreased step length - left, Leaning posteriorly, Shuffle Gait velocity: decr Gait velocity interpretation: Below normal speed for age/gender    ADL:    Cognition: Cognition Overall Cognitive Status: Within Functional Limits for tasks assessed Orientation Level: Oriented to person, Oriented to time, Oriented to situation Cognition Arousal/Alertness: Awake/alert Behavior During Therapy: WFL for tasks assessed/performed Overall Cognitive Status: Within Functional Limits for tasks assessed  Blood pressure 123/84, pulse 98, temperature 98.7 F (37.1 C), temperature source Oral, resp. rate 20, height  (1.676 m), weight 61.009 kg (134 lb 8 oz), SpO2 92 %. Physical Exam  Constitutional: He appears well-developed.  HENT:  Head: Normocephalic and atraumatic.  Eyes: Pupils are equal, round, and reactive to light.  Neck: Neck supple.  Cardiovascular: Normal rate.  Respiratory: No respiratory distress.  GI: He exhibits no distension.  Neurological: He is alert.  RUE slightly limited due to pain in shoulder otherwise nearly 4 to 4+/5. LUE is 4/5 delt,bicep,tricep, wrist, hand. LE: 3/5 HF bilaterally. 4/5 RKE, 4- LKE, ADF/APF nearly 4/5 both limbs. No sensory changes in either left or right arm/leg. Speech generally clear. Has mild left facial droop. Cognitively seems to display reasonable, basic insight and awareness. Language normal. Conversationally appropriate.     Lab Results Last 24 Hours    Results for orders placed or performed during the hospital encounter of 06/08/15 (from the past 24 hour(s))  Glucose, capillary Status: Abnormal   Collection Time:  06/10/15 4:33 PM  Result Value Ref Range   Glucose-Capillary 170 (H) 65 - 99 mg/dL  Glucose, capillary Status: Abnormal   Collection Time: 06/10/15 10:10 PM  Result Value Ref Range   Glucose-Capillary 119 (H) 65 - 99 mg/dL   Comment 1 Notify RN    Comment 2 Document in Chart   CBC Status: Abnormal   Collection Time: 06/11/15 6:29 AM  Result Value Ref Range   WBC 7.1 4.0 - 10.5 K/uL   RBC 4.07 (L) 4.22 - 5.81 MIL/uL   Hemoglobin 11.8 (L) 13.0 - 17.0 g/dL   HCT 16.1 (L) 09.6 - 04.5 %   MCV 83.3 78.0 - 100.0 fL   MCH 29.0 26.0 - 34.0 pg   MCHC 34.8 30.0 - 36.0 g/dL   RDW 40.9 81.1 - 91.4 %   Platelets 125 (L) 150 - 400 K/uL  Basic metabolic panel Status: Abnormal   Collection Time: 06/11/15 6:29 AM  Result Value Ref Range   Sodium 130 (L) 135 - 145 mmol/L   Potassium 2.8 (L) 3.5 - 5.1 mmol/L   Chloride 95 (L) 101 - 111 mmol/L   CO2 24 22 - 32 mmol/L   Glucose, Bld 141 (H) 65 - 99 mg/dL   BUN 18 6 - 20 mg/dL   Creatinine, Ser 7.82 0.61 - 1.24 mg/dL   Calcium 7.7 (L) 8.9 - 10.3 mg/dL   GFR calc non Af Amer >60 >60 mL/min   GFR calc Af Amer >60 >60 mL/min   Anion gap 11 5 - 15  Lipid panel Status: Abnormal   Collection Time: 06/11/15 6:29 AM  Result Value Ref Range   Cholesterol 79 0 - 200 mg/dL   Triglycerides  156 (H) <150 mg/dL   HDL 6 (L) >09>40 mg/dL   Total CHOL/HDL Ratio 13.2 RATIO   VLDL 31 0 - 40 mg/dL   LDL Cholesterol 42 0 - 99 mg/dL  Magnesium Status: Abnormal   Collection Time: 06/11/15 6:29 AM  Result Value Ref Range   Magnesium 1.5 (L) 1.7 - 2.4 mg/dL  Glucose, capillary Status: Abnormal   Collection Time: 06/11/15 8:05 AM  Result Value Ref Range   Glucose-Capillary 136 (H) 65 - 99 mg/dL  Glucose, capillary Status: Abnormal   Collection Time: 06/11/15 12:11  PM  Result Value Ref Range   Glucose-Capillary 129 (H) 65 - 99 mg/dL      Imaging Results (Last 48 hours)    Mr Brain Wo Contrast  06/10/2015 CLINICAL DATA: Progressive confusion, weakness with shift wing gait, and fever. EXAM: MRI HEAD WITHOUT CONTRAST TECHNIQUE: Multiplanar, multiecho pulse sequences of the brain and surrounding structures were obtained without intravenous contrast. COMPARISON: Head CT 06/08/2015 FINDINGS: There are small foci of abnormal hyperintense diffusion weighted signal measuring 6 mm in right frontal cortex and 4 mm in right parietal cortex with normal to slightly restricted diffusion on the ADC map compatible with acute to early subacute infarcts. There is a small cortical and subcortical infarct in the posterior right frontal lobe which appears subacute to chronic with some T2 shine through on diffusion-weighted imaging. A small amount of chronic blood products are noted associated with this infarct, and there is also a small amount of microhemorrhage more posteriorly in the right frontal lobe, also likely related to prior ischemia. There is no evidence of mass, midline shift, or extra-axial fluid collection. There is mild cerebral atrophy. Patchy T2 hyperintensities in the periventricular greater than subcortical cerebral white matter bilaterally and in the pons are nonspecific but compatible with mild chronic small vessel ischemic disease. Small, chronic infarcts are noted in the left cerebellar hemisphere and superior vermis. Orbits are unremarkable. Paranasal sinuses and left mastoid air cells are clear. There is a small right mastoid effusion. Major intracranial vascular flow voids are preserved. IMPRESSION: 1. Punctate, acute to early subacute right frontal and right parietal cortical infarcts. 2. Small, subacute to chronic posterior right frontal cortical/subcortical infarct. 3. Chronic cerebellar infarcts. 4. Mild chronic small vessel ischemic disease in  the cerebral white matter. 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 Does the need for close, 24 hr/day medical supervision in concert with the patient's rehab needs make it unreasonable for this patient to be served in a less intensive setting? Yes and Potentially 2. Co-Morbidities requiring supervision/potential complications: DM, 3. Due to bladder management, bowel management, safety, skin/wound care, disease management, medication administration, pain management and patient education, does the patient require 24 hr/day rehab nursing? Yes 4. Does the patient require coordinated care of a physician, rehab nurse, PT (1-2 hrs/day, 5 days/week) and OT (1-2 hrs/day, 5 days/week), potentially SLP, to address physical and functional deficits in the context of the above medical diagnosis(es)? Yes Addressing deficits in the following areas: balance, endurance, locomotion, strength, transferring, bowel/bladder control, bathing, dressing, feeding, grooming, toileting, cognition and psychosocial support 5. Can the patient actively participate in an intensive therapy program of at least 3 hrs of therapy per day at least 5 days per week? Yes 6. The potential for patient to make measurable gains while on inpatient rehab is good 7. Anticipated functional outcomes upon discharge from inpatient rehab  are modified independent with PT, modified independent with OT, modified independent with SLP. 8. Estimated rehab length of stay to reach the above functional goals is: potentially 7-10 days 9. Does the patient have adequate social supports and living environment to accommodate these discharge functional goals? Yes and Potentially 10. Anticipated D/C setting: Home 11. Anticipated post D/C treatments: HH therapy and Outpatient therapy 12. Overall Rehab/Functional Prognosis:  excellent  RECOMMENDATIONS: This patient's condition is appropriate for continued rehabilitative care in the following setting: CIR Patient has agreed to participate in recommended program. Yes Note that insurance prior authorization may be required for reimbursement for recommended care.  Comment: Rehab Admissions Coordinator to follow up.  Thanks,  [name], MD, [place] Dom     06/11/2015       Revision History     Date/Time User Provider Type Action   06/11/2015 3:48 PM [name], MD Physician Sign   06/11/2015 3:43 PM [name], MD Physician Share   View Details Report       Routing History     Date/Time From To Method   06/11/2015 3:48 PM [name], MD [name], MD In Basket

## 2015-06-14 NOTE — Progress Notes (Addendum)
69 y.o. male admitted to [facility] on 06/08/2015 after seeing his primary care physician for right shoulder pain. The patient injured his shoulder while trying to move a picnic table while sitting on his riding mower. During that visit the patient's wife reported that the patient has been very lethargic recently and has been increasingly confused over the past 3-4 weeks. 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 Neurology feels infarcts likely due to chronic small vessel disease and to rule out infectious source. ASA added for secondary stroke prevention. RIght shoulder MRI without evidence of infection, there is rotator cuff degeneration  Subjective/Complaints: Patient denies any shoulder pain on the right side. Denies any breathing problem. Discussed swallowing with speech therapist, no restrictions on diet, no coughing with meals. Review of systems denies any bowel incontinence diarrhea constipation nausea or vomiting, with bladder denies any incontinence or burning with urination. Objective: Vital Signs: Blood pressure 111/82, pulse 93, temperature 98.3 F (36.8 C), temperature source Oral, resp. rate 18, height 5' 6"  (1.676 m), weight 59.6 kg (131 lb 6.3 oz), SpO2 93 %. No results found. Results for orders placed or performed during the hospital encounter of 06/13/15 (from the past 72 hour(s))  Glucose, capillary     Status: Abnormal   Collection Time: 06/13/15  5:10 PM  Result Value Ref Range   Glucose-Capillary 338 (H) 65 - 99 mg/dL  Glucose, capillary     Status: Abnormal   Collection Time: 06/13/15  9:08 PM  Result Value Ref Range   Glucose-Capillary 255 (H) 65 - 99 mg/dL  CBC WITH DIFFERENTIAL     Status: Abnormal   Collection Time: 06/14/15  5:15 AM  Result Value Ref Range   WBC 8.6 4.0 - 10.5 K/uL   RBC 4.16 (L) 4.22 - 5.81 MIL/uL   Hemoglobin 12.2 (L) 13.0 - 17.0 g/dL   HCT 35.1 (L) 39.0 -  52.0 %   MCV 84.4 78.0 - 100.0 fL   MCH 29.3 26.0 - 34.0 pg   MCHC 34.8 30.0 - 36.0 g/dL   RDW 13.5 11.5 - 15.5 %   Platelets 200 150 - 400 K/uL   Neutrophils Relative % 79 (H) 43 - 77 %   Neutro Abs 6.9 1.7 - 7.7 K/uL   Lymphocytes Relative 10 (L) 12 - 46 %   Lymphs Abs 0.8 0.7 - 4.0 K/uL   Monocytes Relative 10 3 - 12 %   Monocytes Absolute 0.8 0.1 - 1.0 K/uL   Eosinophils Relative 1 0 - 5 %   Eosinophils Absolute 0.1 0.0 - 0.7 K/uL   Basophils Relative 0 0 - 1 %   Basophils Absolute 0.0 0.0 - 0.1 K/uL  Comprehensive metabolic panel     Status: Abnormal   Collection Time: 06/14/15  5:15 AM  Result Value Ref Range   Sodium 130 (L) 135 - 145 mmol/L   Potassium 3.4 (L) 3.5 - 5.1 mmol/L   Chloride 95 (L) 101 - 111 mmol/L   CO2 27 22 - 32 mmol/L   Glucose, Bld 160 (H) 65 - 99 mg/dL   BUN 20 6 - 20 mg/dL   Creatinine, Ser 0.94 0.61 - 1.24 mg/dL   Calcium 7.8 (L) 8.9 - 10.3 mg/dL   Total Protein 5.6 (L) 6.5 - 8.1 g/dL   Albumin 1.9 (L) 3.5 - 5.0 g/dL   AST 122 (H) 15 - 41 U/L   ALT 62  17 - 63 U/L   Alkaline Phosphatase 121 38 - 126 U/L   Total Bilirubin 0.6 0.3 - 1.2 mg/dL   GFR calc non Af Amer >60 >60 mL/min   GFR calc Af Amer >60 >60 mL/min    Comment: (NOTE) The eGFR has been calculated using the CKD EPI equation. This calculation has not been validated in all clinical situations. eGFR's persistently <60 mL/min signify possible Chronic Kidney Disease.    Anion gap 8 5 - 15  Glucose, capillary     Status: Abnormal   Collection Time: 06/14/15  6:46 AM  Result Value Ref Range   Glucose-Capillary 176 (H) 65 - 99 mg/dL     HEENT: normal Cardio: RRR and no murmurs Resp: CTA B/L and unlabored GI: BS positive and nontender nondistended Extremity:  Pulses positive and No Edema Skin:   Intact Neuro: Alert/Oriented, Abnormal Sensory reduced sensation in bilateral hands and feet, atrophy of the hand and foot intrinsic muscles, Abnormal Motor 44/5 in the left deltoid, biceps,  triceps, grip, hip flexor, knee extensors, ankle dorsiflexion 5/5 on the right side with exception of right grip  which is  4/5 and Abnormal FMC Ataxic/ dec FMC Musc/Skel:  Other nno pain with right shoulder range of motion,, hand and foot intrinsic muscle atrophy is noted Gen. no acute distress   Assessment/Plan: 1. Functional deficits secondary to right fontal-parietal infarct with left hemiparesis, in a patient with severe diabetic neuropathy complicated by MRSA bacteremia  which require 3+ hours per day of interdisciplinary therapy in a comprehensive inpatient rehab setting. Physiatrist is providing close team supervision and 24 hour management of active medical problems listed below. Physiatrist and rehab team continue to assess barriers to discharge/monitor patient progress toward functional and medical goals. FIM:                   Comprehension Comprehension Mode: Auditory Comprehension: 5-Understands basic 90% of the time/requires cueing < 10% of the time  Expression Expression Mode: Verbal Expression: 5-Expresses basic 90% of the time/requires cueing < 10% of the time.  Social Interaction Social Interaction: 6-Interacts appropriately with others with medication or extra time (anti-anxiety, antidepressant).  Problem Solving Problem Solving: 5-Solves basic problems: With no assist  Memory Memory: 5-Recognizes or recalls 90% of the time/requires cueing < 10% of the time  Medical Problem List and Plan: 1. Functional deficits secondary to right fontal-parietal infarct complicated by MRSA bacteremia (?source) 2.  DVT Prophylaxis/Anticoagulation: Pharmaceutical: Lovenox 3. Pain Management: Denies any back or shoulder pain. Will use Tylenol prn.   4. Mood: LCSW to follow for evaluation and support.   5. Neuropsych: This patient is not capable of making decisions on his own behalf. 6. Skin/Wound Care: Routine pressure relief measures.   7. Fluids/Electrolytes/Nutrition:  Monitor I/O. Check lytes in am.   8. MRSA Bacteremia: Continue Vancomycin through July 11 th with trough goal 15-20 as long as blood cultures remain negative.  OK for PICC tomorrow if needed.  Has completed 5 day course of diflucan for yeast in urine.   9. Hyponatremia:  Multifactorial due to IVF/HCTZ.Recheck lytes in am.   10 Hypokalemia: Continue supplement for now. Check labs in am. Should improve with HCTZ stopped.   11. 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 Continue lantus for now. Was on metformin bid at home--HgbA1C- 8.9, we will resume 12. HTN: Monitor BP bid and adjust meds as indicated. Continue prinivil. Will hold HXTZ due to  continued drop in Na+   LOS (Days) 1 A FACE TO FACE EVALUATION WAS PERFORMED  Aubryanna Nesheim E 06/14/2015, 8:25 AM

## 2015-06-14 NOTE — Progress Notes (Signed)
Patient complained of dizziness while working with speech therapist. BP noted to be low at 90/51. Patient told nurse/writer that he felt better. Mahalia LongestPam Lowe, P.A. notified.

## 2015-06-14 NOTE — Evaluation (Signed)
Occupational Therapy Assessment and Plan  Patient Details  Name: Jacob White MRN: [id number] Date of Birth: 10/07/1946  OT Diagnosis: abnormal posture, cognitive deficits, muscle weakness (generalized) and decreased sensation Rehab Potential: Rehab Potential (ACUTE ONLY): Good (for stated goals) ELOS: 5-7 days   Today's Date: 06/14/2015 OT Individual Time: 0900-1000 OT Individual Time Calculation (min): 60 min     Problem List:  Patient Active Problem List   Diagnosis Date Noted  . Stroke, acute, thrombotic, within last 8 weeks 06/13/2015  . Left hemiparesis   . Poorly controlled type 2 diabetes mellitus   . Cerebral thrombosis with cerebral infarction 06/11/2015  . MRSA bacteremia 06/11/2015  . Bacteremia 06/09/2015  . Weakness generalized 06/08/2015  . Confusion 06/08/2015  . Dehydration 06/08/2015  . Diabetes mellitus without complication 09/32/6712  . Pain of right upper extremity 06/08/2015    Past Medical History:  Past Medical History  Diagnosis Date  . Diabetes mellitus without complication   . Hypertension   . Hyperlipemia    Past Surgical History:  Past Surgical History  Procedure Laterality Date  . Tee without cardioversion N/A 06/12/2015    Procedure: TRANSESOPHAGEAL ECHOCARDIOGRAM (TEE);  Surgeon: Pixie Casino, MD;  Location: [facility] ENDOSCOPY;  Service: Cardiovascular;  Laterality: N/A;    Assessment & Plan Clinical Impression: Patient is a 69 y.o. year old male with recent admission to [facility] on 06/08/2015 after seeing his primary care physician for right shoulder pain. The patient injured his shoulder while trying to move a picnic table while sitting on his riding mower. During that visit the patient's wife reported that the patient has been very lethargic recently and has been increasingly confused over the past 3-4 weeks. 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  Neurology feels infarcts likely due to chronic small vessel disease and to rule out infectious source. ASA added for secondary stroke prevention. Hospital course complicated by MRSA bacteremia and patient placed on IV vancomycin for treatment.  Patient transferred to CIR on 06/13/2015 .    Patient currently requires min with basic self-care skills  secondary to muscle weakness, decreased cardiorespiratoy endurance, decreased initiation, decreased attention, decreased problem solving and decreased safety awareness and decreased standing balance, decreased postural control and decreased balance strategies.  Prior to hospitalization, patient could complete ADLs and IADLs with independent .  Patient will benefit from skilled intervention to increase independence with basic self-care skills prior to discharge home with care partner.  Anticipate patient will require intermittent supervision and follow up outpatient.  OT - End of Session Activity Tolerance: Decreased this session Endurance Deficit: Yes Endurance Deficit Description: muscular endurance and cardiorespiratory endurance deficit OT Assessment Rehab Potential (ACUTE ONLY): Good (for stated goals) Barriers to Discharge:  (none known) OT Patient demonstrates impairments in the following area(s): Balance;Vision;Cognition;Safety;Endurance;Motor;Sensory OT Basic ADL's Functional Problem(s): Grooming;Bathing;Dressing;Toileting OT Advanced ADL's Functional Problem(s):  (n/a) OT Transfers Functional Problem(s): Toilet;Tub/Shower OT Additional Impairment(s): None OT Plan OT Intensity: Minimum of 1-2 x/day, 45 to 90 minutes OT Frequency: 5 out of 7 days OT Duration/Estimated Length of Stay: 5-7 days OT Treatment/Interventions: Balance/vestibular training;Community reintegration;Neuromuscular re-education;Patient/family education;Self Care/advanced ADL retraining;Therapeutic Exercise;UE/LE Coordination activities;UE/LE Strength taining/ROM;Therapeutic  Activities;Pain management;DME/adaptive equipment instruction;Discharge planning;Cognitive remediation/compensation;Functional mobility training OT Self Feeding Anticipated Outcome(s): n/a OT Basic Self-Care Anticipated Outcome(s): Mod I  OT Toileting Anticipated Outcome(s): Mod I  OT Bathroom Transfers Anticipated Outcome(s): Mod I  OT Recommendation Recommendations for Other  Services:  (none) Patient destination: Home Follow Up Recommendations: Outpatient OT Equipment Recommended: Tub/shower bench   Skilled Therapeutic Intervention Upon entering the room, pt supine in bed with wife present in room. Pt with no c/o pain this session. OT educated pt and caregiver on OT purpose, POC, and goals with pt and wife verbalizing understanding. Pt performed supine >sit with steady assist. Pt ambulating to bathroom with RW and steady assistance. Min A stand pivot transfer onto TTB for bathing at shower level. Pt requiring assistance to wash B feet and steady assistance when performing lateral leans to wash buttocks for safety. Pt ambulating back to bed in same manner as above. Pt seated on edge of bed for dressing tasks with steady assistance. Pt seated in chair next to bed with family member present in room. Call bell and all needed items within reach.   OT Evaluation Precautions/Restrictions  Precautions Precautions: Fall Restrictions Weight Bearing Restrictions: No Vital Signs Therapy Vitals Temp: 97.6 F (36.4 C) Temp Source: Oral Pulse Rate: 88 BP: 98/74 mmHg Patient Position (if appropriate): Sitting Oxygen Therapy SpO2: 98 % O2 Device: Not Delivered Pain Pain Assessment Pain Assessment: No/denies pain Pain Score: 0-No pain Home Living/Prior Functioning Home Living Living Arrangements: Spouse/significant other, Other relatives Available Help at Discharge: Available 24 hours/day Type of Home: Mobile home Home Access: Stairs to enter Entrance Stairs-Number of Steps: 4 Entrance  Stairs-Rails: Left, Right, Can reach both Home Layout: One level Additional Comments: recently hurt R shoulder trying to move a picnic table while on the riding lawnmower  Lives With: Spouse IADL History Education: 9th grade  Prior Function Level of Independence: Independent with transfers, Independent with gait  Able to Take Stairs?: Yes Driving: Yes Vocation: Part time employment Vocation Requirements: Air traffic controller Vision/Perception  Vision- History Baseline Vision/History: Wears glasses Wears Glasses: Reading only Patient Visual Report: Blurring of vision Vision- Assessment Vision Assessment?: Vision impaired- to be further tested in functional context  Cognition Overall Cognitive Status: Impaired/Different from baseline Arousal/Alertness: Awake/alert Orientation Level: Person;Place;Situation Person: Oriented Place: Oriented Situation: Oriented Year: 2016 Month: June Day of Week: Correct Memory: Impaired Memory Impairment: Decreased recall of new information Immediate Memory Recall: Sock;Blue;Bed Memory Recall: Sock;Bed Memory Recall Sock: With Cue Memory Recall Bed: With Cue Attention: Focused;Sustained Focused Attention: Appears intact Sustained Attention: Appears intact Selective Attention: Appears intact Awareness: Appears intact Problem Solving: Impaired Problem Solving Impairment: Functional basic Safety/Judgment: Appears intact Sensation Sensation Light Touch: Impaired Detail Light Touch Impaired Details: Impaired RLE;Impaired LLE Stereognosis: Not tested Hot/Cold: Appears Intact Proprioception: Appears Intact Additional Comments: toes intermittently tingly/burning from peripheral neuropathy Coordination Gross Motor Movements are Fluid and Coordinated: Yes Fine Motor Movements are Fluid and Coordinated: No Finger Nose Finger Test: [place] decreased for speed and dexterity but pt able to functionally tie shoes and fasten pants Heel Shin Test: wfl B  LEs  Motor  Motor Motor: Other (comment) Motor - Skilled Clinical Observations: slight L UE monoplegia, decreased balance Mobility  Bed Mobility Bed Mobility: Rolling Right;Rolling Left;Left Sidelying to Sit;Sit to Supine Rolling Right: 6: Modified independent (Device/Increase time) Rolling Left: 6: Modified independent (Device/Increase time) Left Sidelying to Sit: 5: Supervision Left Sidelying to Sit Details: Verbal cues for precautions/safety Sit to Supine: 5: Supervision Sit to Supine - Details: Verbal cues for precautions/safety Transfers Sit to Stand: 4: Min assist;From bed Sit to Stand Details: Manual facilitation for placement Stand to Sit: 4: Min guard  Trunk/Postural Assessment  Cervical Assessment Cervical Assessment: Exceptions to [facility] Cervical AROM Overall  Cervical AROM: Deficits;Due to premorid status Overall Cervical AROM Comments: extremely limited L cervical side flexion Thoracic Assessment Thoracic Assessment: Within Functional Limits Lumbar Assessment Lumbar Assessment: Exceptions to [facility] Lumbar AROM Overall Lumbar AROM: Deficits;Due to premorid status Overall Lumbar AROM Comments: limited L trunk side flexion Postural Control Postural Control: Deficits on evaluation Postural Limitations: premorbid forward head  Balance Balance Balance Assessed: Yes Static Sitting Balance Static Sitting - Balance Support: Feet supported;Left upper extremity supported;Right upper extremity supported Static Sitting - Level of Assistance: 5: Stand by assistance Dynamic Sitting Balance Dynamic Sitting - Balance Support: Feet supported;Left upper extremity supported;Right upper extremity supported Dynamic Sitting - Level of Assistance: 5: Stand by assistance Dynamic Sitting - Balance Activities: Lateral lean/weight shifting;Forward lean/weight shifting Sitting balance - Comments: posterior lean Static Standing Balance Static Standing - Balance Support: During functional  activity;No upper extremity supported Static Standing - Level of Assistance: 4: Min assist Dynamic Standing Balance Dynamic Standing - Balance Support: During functional activity;No upper extremity supported Dynamic Standing - Level of Assistance: 4: Min assist Dynamic Standing - Balance Activities: Lateral lean/weight shifting;Forward lean/weight shifting Extremity/Trunk Assessment RUE Assessment RUE Assessment: Within Functional Limits LUE Assessment LUE Assessment: Exceptions to WFL LUE AROM (degrees) Overall AROM Left Upper Extremity: Within functional limits for tasks assessed LUE Strength LUE Overall Strength: Deficits LUE Overall Strength Comments: 4+/5 grossly  FIM:  FIM - Eating Eating Activity: 6: Swallowing techniques: self-managed FIM - Grooming Grooming Steps: Wash, rinse, dry face;Wash, rinse, dry hands;Oral care, brush teeth, clean dentures;Brush, comb hair Grooming: 4: Steadying assist  or patient completes 3 of 4 or 4 of 5 steps FIM - Bathing Bathing Steps Patient Completed: Chest;Right Arm;Left Arm;Abdomen;Front perineal area;Buttocks;Right upper leg;Left upper leg Bathing: 4: Min-Patient completes 8-9 [id number] parts or 75+ percent FIM - Upper Body Dressing/Undressing Upper body dressing/undressing steps patient completed: Thread/unthread right sleeve of pullover shirt/dresss;Thread/unthread left sleeve of pullover shirt/dress;Put head through opening of pull over shirt/dress;Pull shirt over trunk Upper body dressing/undressing: 5: Set-up assist to: Obtain clothing/put away FIM - Lower Body Dressing/Undressing Lower body dressing/undressing steps patient completed: Thread/unthread right underwear leg;Thread/unthread left underwear leg;Pull underwear up/down;Thread/unthread right pants leg;Thread/unthread left pants leg;Pull pants up/down;Fasten/unfasten pants;Don/Doff right sock;Don/Doff left sock;Don/Doff right shoe;Don/Doff left shoe;Fasten/unfasten right  shoe;Fasten/unfasten left shoe Lower body dressing/undressing: 4: Steadying Assist FIM - BControl and instrumentation engineerDevices: Arm rests Bed/Chair Transfer: 5: Supine > Sit: Supervision (verbal cues/safety issues);5: Sit > Supine: Supervision (verbal cues/safety issues);4: Bed > Chair or W/C: Min A (steadying Pt. > 75%);4: Chair or W/C > Bed: Min A (steadying Pt. > 75%) FIM - Tub/Shower Transfers Tub/Shower Assistive Devices: Tub transfer bench;Grab bars;Walk in shower Tub/shower Transfers: 4-Into Tub/Shower: Min A (steadying Pt. > 75%/lift 1 leg);4-Out of Tub/Shower: Min A (steadying Pt. > 75%/lift 1 leg)   Refer to Care Plan for Long Term Goals  Recommendations for other services: None  Discharge Criteria: Patient will be discharged from OT if patient refuses treatment 3 consecutive times without medical reason, if treatment goals not met, if there is a change in medical status, if patient makes no progress towards goals or if patient is discharged from hospital.  The above assessment, treatment plan, treatment alternatives and goals were discussed and mutually agreed upon: by patient  PPhineas Semen6/30/2016, 12:55 PM

## 2015-06-15 ENCOUNTER — Inpatient Hospital Stay (HOSPITAL_COMMUNITY): Payer: Medicare Other | Admitting: Speech Pathology

## 2015-06-15 ENCOUNTER — Inpatient Hospital Stay (HOSPITAL_COMMUNITY): Payer: Medicare Other | Admitting: Physical Therapy

## 2015-06-15 ENCOUNTER — Inpatient Hospital Stay (HOSPITAL_COMMUNITY): Payer: Medicare Other | Admitting: Occupational Therapy

## 2015-06-15 LAB — BASIC METABOLIC PANEL
Anion gap: 7 (ref 5–15)
BUN: 19 mg/dL (ref 6–20)
CO2: 28 mmol/L (ref 22–32)
Calcium: 8.1 mg/dL — ABNORMAL LOW (ref 8.9–10.3)
Chloride: 98 mmol/L — ABNORMAL LOW (ref 101–111)
Creatinine, Ser: 0.95 mg/dL (ref 0.61–1.24)
GLUCOSE: 207 mg/dL — AB (ref 65–99)
POTASSIUM: 4 mmol/L (ref 3.5–5.1)
Sodium: 133 mmol/L — ABNORMAL LOW (ref 135–145)

## 2015-06-15 LAB — GLUCOSE, CAPILLARY
GLUCOSE-CAPILLARY: 139 mg/dL — AB (ref 65–99)
Glucose-Capillary: 212 mg/dL — ABNORMAL HIGH (ref 65–99)
Glucose-Capillary: 227 mg/dL — ABNORMAL HIGH (ref 65–99)
Glucose-Capillary: 196 mg/dL — ABNORMAL HIGH (ref 65–99)

## 2015-06-15 LAB — VANCOMYCIN, TROUGH: Vancomycin Tr: 15 ug/mL (ref 10.0–20.0)

## 2015-06-15 MED ORDER — METFORMIN HCL 850 MG PO TABS
850.0000 mg | ORAL_TABLET | Freq: Two times a day (BID) | ORAL | Status: DC
  Administered 2015-06-15 – 2015-06-18 (×6): 850 mg via ORAL
  Filled 2015-06-15 (×9): qty 1

## 2015-06-15 NOTE — IPOC Note (Signed)
Overall Plan of Care [facility](IPOC) Patient Details Name: Jacob White MRN: [id number] DOB: 12/08/1946  Admitting Diagnosis: R frontal parietal infarct  Hospital Problems: Principal Problem:   Stroke, acute, thrombotic, within last 8 weeks Active Problems:   MRSA bacteremia   Left hemiparesis   Poorly controlled type 2 diabetes mellitus     Functional Problem List: Nursing Medication Management, Safety, Pain, Skin Integrity  PT Balance, Safety, Endurance, Sensory, Motor, Nutrition (cachectic looking)  OT Balance, Vision, Cognition, Safety, Endurance, Motor, Sensory  SLP Cognition  TR         Basic ADL's: OT Grooming, Bathing, Dressing, Toileting     Advanced  ADL's: OT  (n/a)     Transfers: PT Bed Mobility, Bed to Chair, Car, Occupational psychologisturniture  OT Toilet, Research scientist (life sciences)Tub/Shower     Locomotion: PT Ambulation, Psychologist, prison and probation servicesWheelchair Mobility, Stairs     Additional Impairments: OT None  SLP Social Cognition   Problem Solving, Memory  TR      Anticipated Outcomes Item Anticipated Outcome  Self Feeding n/a  Swallowing      Basic self-care  Mod I   Toileting  Mod I    Bathroom Transfers Mod I   Bowel/Bladder  Continent of bowel and bladder LBM 6/29  Transfers  Mod I  Locomotion  Mod I ambulation  Education officer, environmentalCommunication     Cognition  Supervision for basic   Pain  <3  Safety/Judgment  Supervision   Therapy Plan: PT Intensity: Minimum of 1-2 x/day ,45 to 90 minutes PT Frequency: 5 out of 7 days PT Duration Estimated Length of Stay: 5-7 days OT Intensity: Minimum of 1-2 x/day, 45 to 90 minutes OT Frequency: 5 out of 7 days OT Duration/Estimated Length of Stay: 5-7 days SLP Intensity: Minumum of 1-2 x/day, 30 to 90 minutes SLP Frequency: 3 to 5 out of 7 days SLP Duration/Estimated Length of Stay: 7 days        Team Interventions: Nursing Interventions Patient/Family Education, Disease Management/Prevention, Pain Management, Medication Management, Skin Care/Wound Management  PT interventions  Ambulation/gait training, Cognitive remediation/compensation, Discharge planning, DME/adaptive equipment instruction, Functional mobility training, Pain management, Psychosocial support, Splinting/orthotics, Therapeutic Activities, UE/LE Strength taining/ROM, Visual/perceptual remediation/compensation, Warden/rangerBalance/vestibular training, Community reintegration, Disease management/prevention, Neuromuscular re-education, Patient/family education, Skin care/wound management, Museum/gallery curatortair training, Therapeutic Exercise, UE/LE Coordination activities, Wheelchair propulsion/positioning  OT Interventions Warden/rangerBalance/vestibular training, FirefighterCommunity reintegration, Chief of Staffeuromuscular re-education, Equities traderatient/family education, Self Care/advanced ADL retraining, Therapeutic Exercise, UE/LE Coordination activities, UE/LE Strength taining/ROM, Therapeutic Activities, Pain management, DME/adaptive equipment instruction, Discharge planning, Cognitive remediation/compensation, Functional mobility training  SLP Interventions Cognitive remediation/compensation, Cueing hierarchy, Functional tasks, Internal/external aids, Environmental controls, Patient/family education  TR Interventions    SW/CM Interventions Discharge Planning, Psychosocial Support, Patient/Family Education    Team Discharge Planning: Destination: PT-Home ,OT- Home , SLP-Home Projected Follow-up: PT-Home health PT, 24 hour supervision/assistance, OT-  Outpatient OT, SLP-Home Health SLP, 24 hour supervision/assistance Projected Equipment Needs: PT-To be determined, OT- Tub/shower bench, SLP-None recommended by SLP Equipment Details: PT-Pt has no equipment, OT-  Patient/family involved in discharge planning: PT- Patient, Family member/caregiver,  OT-Patient, SLP-Patient  MD ELOS: 7-8 days Medical Rehab Prognosis:  Excellent Assessment: The patient has been admitted for CIR therapies with the diagnosis of right fronto-parietal infarct. The team will be addressing functional  mobility, strength, stamina, balance, safety, adaptive techniques and equipment, self-care, bowel and bladder mgt, patient and caregiver education, NMR, cognitive perceptual awareness, ego support, community reintegration, communication. Goals have been set at mod I for mobility and self-care/ADL's and supervision to mod I  with cognition.    [name], MD, FAAPMR      See Team Conference Notes for weekly updates to the plan of care

## 2015-06-15 NOTE — Progress Notes (Signed)
69 y.o. male admitted to [facility] on 06/08/2015 after seeing his primary care physician for right shoulder pain. The patient injured his shoulder while trying to move a picnic table while sitting on his riding mower. During that visit the patient's wife reported that the patient has been very lethargic recently and has been increasingly confused over the past 3-4 weeks. 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 Neurology feels infarcts likely due to chronic small vessel disease and to rule out infectious source. ASA added for secondary stroke prevention. 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  Review of systems denies any bowel or bladder issues, no breathing problems, no pain issuesObjective: Vital Signs: Blood pressure 119/80, pulse 88, temperature 98 F (36.7 C), temperature source Oral, resp. rate 18, height _0  (1.676 m), weight 59.6 kg (131 lb 6.3 oz), SpO2 95 %. No results found. Results for orders placed or performed during the hospital encounter of 06/13/15 (from the past 72 hour(s))  Glucose, capillary     Status: Abnormal   Collection Time: 06/13/15  5:10 PM  Result Value Ref Range   Glucose-Capillary 338 (H) 65 - 99 mg/dL  Glucose, capillary     Status: Abnormal   Collection Time: 06/13/15  9:08 PM  Result Value Ref Range   Glucose-Capillary 255 (H) 65 - 99 mg/dL  CBC WITH DIFFERENTIAL     Status: Abnormal   Collection Time: 06/14/15  5:15 AM  Result Value Ref Range   WBC 8.6 4.0 - 10.5 K/uL   RBC 4.16 (L) 4.22 - 5.81 MIL/uL   Hemoglobin 12.2 (L) 13.0 - 17.0 g/dL   HCT 35.1 (L) 39.0 - 52.0 %   MCV 84.4 78.0 - 100.0 fL   MCH 29.3 26.0 - 34.0 pg   MCHC 34.8 30.0 - 36.0 g/dL   RDW 13.5 11.5 - 15.5 %   Platelets 200 150 - 400 K/uL   Neutrophils Relative % 79 (H) 43 -  77 %   Neutro Abs 6.9 1.7 - 7.7 K/uL   Lymphocytes Relative 10 (L) 12 - 46 %   Lymphs Abs 0.8 0.7 - 4.0 K/uL   Monocytes Relative 10 3 - 12 %   Monocytes Absolute 0.8 0.1 - 1.0 K/uL   Eosinophils Relative 1 0 - 5 %   Eosinophils Absolute 0.1 0.0 - 0.7 K/uL   Basophils Relative 0 0 - 1 %   Basophils Absolute 0.0 0.0 - 0.1 K/uL  Comprehensive metabolic panel     Status: Abnormal   Collection Time: 06/14/15  5:15 AM  Result Value Ref Range   Sodium 130 (L) 135 - 145 mmol/L   Potassium 3.4 (L) 3.5 - 5.1 mmol/L   Chloride 95 (L) 101 - 111 mmol/L   CO2 27 22 - 32 mmol/L   Glucose, Bld 160 (H) 65 - 99 mg/dL   BUN 20 6 - 20 mg/dL   Creatinine, Ser 0.94 0.61 - 1.24 mg/dL   Calcium 7.8 (L) 8.9 - 10.3 mg/dL   Total Protein 5.6 (L) 6.5 - 8.1 g/dL   Albumin 1.9 (L) 3.5 - 5.0 g/dL   AST 122 (H) 15 - 41 U/L   ALT 62 17 - 63 U/L   Alkaline Phosphatase 121 38 - 126 U/L   Total Bilirubin 0.6 0.3 - 1.2 mg/dL  GFR calc non Af Amer >60 >60 mL/min   GFR calc Af Amer >60 >60 mL/min    Comment: (NOTE) The eGFR has been calculated using the CKD EPI equation. This calculation has not been validated in all clinical situations. eGFR's persistently <60 mL/min signify possible Chronic Kidney Disease.    Anion gap 8 5 - 15  Glucose, capillary     Status: Abnormal   Collection Time: 06/14/15  6:46 AM  Result Value Ref Range   Glucose-Capillary 176 (H) 65 - 99 mg/dL  Glucose, capillary     Status: Abnormal   Collection Time: 06/14/15 12:07 PM  Result Value Ref Range   Glucose-Capillary 282 (H) 65 - 99 mg/dL  Glucose, capillary     Status: Abnormal   Collection Time: 06/14/15  4:56 PM  Result Value Ref Range   Glucose-Capillary 248 (H) 65 - 99 mg/dL  Glucose, capillary     Status: Abnormal   Collection Time: 06/14/15  9:40 PM  Result Value Ref Range   Glucose-Capillary 230 (H) 65 - 99 mg/dL  Basic metabolic panel     Status: Abnormal   Collection Time: 06/15/15  4:58 AM  Result Value Ref Range    Sodium 133 (L) 135 - 145 mmol/L   Potassium 4.0 3.5 - 5.1 mmol/L   Chloride 98 (L) 101 - 111 mmol/L   CO2 28 22 - 32 mmol/L   Glucose, Bld 207 (H) 65 - 99 mg/dL   BUN 19 6 - 20 mg/dL   Creatinine, Ser 0.95 0.61 - 1.24 mg/dL   Calcium 8.1 (L) 8.9 - 10.3 mg/dL   GFR calc non Af Amer >60 >60 mL/min   GFR calc Af Amer >60 >60 mL/min    Comment: (NOTE) The eGFR has been calculated using the CKD EPI equation. This calculation has not been validated in all clinical situations. eGFR's persistently <60 mL/min signify possible Chronic Kidney Disease.    Anion gap 7 5 - 15  Glucose, capillary     Status: Abnormal   Collection Time: 06/15/15  6:56 AM  Result Value Ref Range   Glucose-Capillary 212 (H) 65 - 99 mg/dL     HEENT: normal Cardio: RRR and no murmurs Resp: CTA B/L and unlabored GI: BS positive and nontender nondistended Extremity:  Pulses positive and No Edema Skin:   Intact Neuro: Alert/Oriented, Abnormal Sensory reduced sensation in bilateral hands and feet, atrophy of the hand and foot intrinsic muscles, Abnormal Motor 4/5 in the left deltoid, biceps, triceps, grip, hip flexor, knee extensors, ankle dorsiflexion 5/5 on the right side with exception of right grip  which is  4/5 and Abnormal FMC Ataxic/ dec FMC Musc/Skel:  Other nno pain with right shoulder range of motion,, hand and foot intrinsic muscle atrophy is noted Gen. no acute distress   Assessment/Plan: 1. Functional deficits secondary to right fontal-parietal infarct with left hemiparesis, in a patient with severe diabetic neuropathy complicated by MRSA bacteremia  which require 3+ hours per day of interdisciplinary therapy in a comprehensive inpatient rehab setting. Physiatrist is providing close team supervision and 24 hour management of active medical problems listed below. Physiatrist and rehab team continue to assess barriers to discharge/monitor patient progress toward functional and medical goals. FIM: FIM -  Bathing Bathing Steps Patient Completed: Chest, Right Arm, Left Arm, Abdomen, Front perineal area, Buttocks, Right upper leg, Left upper leg Bathing: 4: Min-Patient completes 8-9 [id number] parts or 75+ percent  FIM - Upper Body Dressing/Undressing Upper body  dressing/undressing steps patient completed: Thread/unthread right sleeve of pullover shirt/dresss, Thread/unthread left sleeve of pullover shirt/dress, Put head through opening of pull over shirt/dress, Pull shirt over trunk Upper body dressing/undressing: 5: Set-up assist to: Obtain clothing/put away FIM - Lower Body Dressing/Undressing Lower body dressing/undressing steps patient completed: Thread/unthread right underwear leg, Thread/unthread left underwear leg, Pull underwear up/down, Thread/unthread right pants leg, Thread/unthread left pants leg, Pull pants up/down, Fasten/unfasten pants, Don/Doff right sock, Don/Doff left sock, Don/Doff right shoe, Don/Doff left shoe, Fasten/unfasten right shoe, Fasten/unfasten left shoe Lower body dressing/undressing: 4: Steadying Assist        FIM - Control and instrumentation engineer Devices: Arm rests Bed/Chair Transfer: 5: Supine > Sit: Supervision (verbal cues/safety issues), 5: Sit > Supine: Supervision (verbal cues/safety issues), 4: Bed > Chair or W/C: Min A (steadying Pt. > 75%), 4: Chair or W/C > Bed: Min A (steadying Pt. > 75%)  FIM - Locomotion: Wheelchair Distance: 200 Locomotion: Wheelchair: 5: Travels 150 ft or more: maneuvers on rugs and over door sills with supervision, cueing or coaxing FIM - Locomotion: Ambulation Locomotion: Ambulation Assistive Devices: Administrator Ambulation/Gait Assistance: 4: Min guard Locomotion: Ambulation: 4: Travels 150 ft or more with minimal assistance (Pt.>75%)  Comprehension Comprehension Mode: Auditory Comprehension: 5-Understands basic 90% of the time/requires cueing < 10% of the time  Expression Expression Mode:  Verbal Expression: 5-Expresses basic 90% of the time/requires cueing < 10% of the time.  Social Interaction Social Interaction: 6-Interacts appropriately with others with medication or extra time (anti-anxiety, antidepressant).  Problem Solving Problem Solving: 3-Solves basic 50 - 74% of the time/requires cueing 25 - 49% of the time  Memory Memory: 4-Recognizes or recalls 75 - 89% of the time/requires cueing 10 - 24% of the time  Medical Problem List and Plan: 1. Functional deficits secondary to right fontal-parietal infarct complicated by MRSA bacteremia (?source) 2.  DVT Prophylaxis/Anticoagulation: Pharmaceutical: Lovenox 3. Pain Management: Denies any back or shoulder pain. Will use Tylenol prn.   4. Mood: LCSW to follow for evaluation and support.   5. Neuropsych: This patient is not capable of making decisions on his own behalf. 6. Skin/Wound Care: Routine pressure relief measures.   7. Fluids/Electrolytes/Nutrition: Monitor I/O. Check lytes in am.   8. MRSA Bacteremia: Continue Vancomycin through July 11 th with trough goal 15-20 as long as blood cultures remain negative.      10 Hypokalemia: Continue supplement for now. Check labs in am. Should improve with HCTZ stopped.   11. DM type 2 with polyneuropathy    uncontrolled : Monitor BS ac/hs. 5U lantus for now. Cont metformin bid increase to 850m 12. HTN: Monitor BP bid and adjust meds as indicated. Continue prinivil. Will hold HCTZ due to continued drop in Na+   LOS (Days) 2 A FACE TO FACE EVALUATION WAS PERFORMED  Alivya Wegman E 06/15/2015, 8:13 AM

## 2015-06-15 NOTE — Progress Notes (Signed)
Physical Therapy Session Note  Patient Details  Name: Jacob White MRN: [id number] Date of Birth: 08/15/1946  Today's Date: 06/15/2015 PT Individual Time: 1530-1625 PT Individual Time Calculation (min): 55 min   Short Term Goals: Week 1:  PT Short Term Goal 1 (Week 1): STGs = LTGs due to ELOS  Skilled Therapeutic Interventions/Progress Updates:   Patient resting in bed upon arrival. Transferred to edge of bed to don shoes and ambulated from room > therapy gym without device  x 170 ft with min guard.   Outcome measures completed as below indicating patient is at risk for falls: 5x Sit to Stand (no UE support) = 27 sec  TUG (avg of 3 trials, no AD, supervision) = 15.3 sec Berg Balance Scale = 45/56   Stair training up/down twelve 6" steps using 2 rails with min guard overall, cues for step-to pattern as patient attempted reciprocal pattern on first step with RLE buckling requiring min A to recover balance.   Simulated car transfer without AD to low and high seat height (patient unsure what vehicle he will go home in) with supervision and min cues for safety and technique.   Patient ambulated from ortho gym back to room without device > 300 ft with close supervision. Patient demonstrates decreased reciprocal arm swing/trunk rotation, crouched posture, and decreased B ankle dorsiflexion. Patient left sitting up in wheelchair with all needs within reach and verbalized need/demonstrated use of call bell to call for RN to get up in room.   Therapy Documentation Precautions:  Precautions Precautions: Fall Restrictions Weight Bearing Restrictions: No Pain: Pain Assessment Pain Assessment: No/denies pain Balance: Balance Balance Assessed: Yes Standardized Balance Assessment Standardized Balance Assessment: Timed Up and Go Test;Berg Balance Test Berg Balance Test Sit to Stand: Able to stand without using hands and stabilize independently Standing Unsupported: Able to stand safely 2  minutes Sitting with Back Unsupported but Feet Supported on Floor or Stool: Able to sit safely and securely 2 minutes Stand to Sit: Sits safely with minimal use of hands Transfers: Able to transfer safely, minor use of hands Standing Unsupported with Eyes Closed: Able to stand 10 seconds safely Standing Ubsupported with Feet Together: Able to place feet together independently and stand 1 minute safely From Standing, Reach Forward with Outstretched Arm: Can reach forward >12 cm safely (5") From Standing Position, Pick up Object from Floor: Able to pick up shoe, needs supervision From Standing Position, Turn to Look Behind Over each Shoulder: Turn sideways only but maintains balance (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/decreased cervical rotation bilaterally) Turn 360 Degrees: Able to turn 360 degrees safely but slowly Standing Unsupported, Alternately Place Feet on Step/Stool: Able to stand independently and complete 8 steps >20 seconds Standing Unsupported, One Foot in Front: Able to plae foot ahead of the other independently and hold 30 seconds Standing on One Leg: Tries to lift leg/unable to hold 3 seconds but remains standing independently Total Score: 45 Timed Up and Go Test TUG: Normal TUG Normal TUG (seconds): 15.3 (avg of 3, no AD, S)  See FIM for current functional status  Therapy/Group: Individual Therapy  Kerney ElbeVarner, Coti Burd A 06/15/2015, 4:26 PM

## 2015-06-15 NOTE — Progress Notes (Signed)
Occupational Therapy Session Note  Patient Details  Name: Jacob White MRN: [id number] Date of Birth: 11/16/1946  Today's Date: 06/15/2015 OT Individual Time: 1000-1057 and 1400-1430 OT Individual Time Calculation (min): 57 min and 30 min    Short Term Goals: Week 1:  OT Short Term Goal 1 (Week 1): STG=LTGs secondary to estimated short LOS  Skilled Therapeutic Interventions/Progress Updates:  Session 1: Upon entering the room, pt seated in wheelchair with wife present in room. Pt with no c/o pain this session. OT educating pt and family members on contact precautions and wearing PPE for protection when in room as pt had 71 year old granddaughter in room yesterday. Pt and wife verbalized understanding but wife did not make a move to don protective gear. Pt propelling wheelchair to sink side to shave face. Pt standing for 20 minutes with supervision - steady assist while shaving face. Pt then ambulated from room 200' to tub room with close supervision and use of RW. OT educated pt and his son on recommendation for TTB for home use in order to decrease fall risk. OT demonstrated transfer with pt returning demonstration with supervision and use of RW. OT also recommended placement of safety treads and non slip bath mat in order to further reduce fall risk. Pt ambulating back to room in same manner as above. Pt then performed 2 sets of 10 wheelchair push ups for B UE strengthening with rest break between sets for fatigue. Pt seated in wheelchair with family present in room and call bell within reach.   Session 2: Upon entering the room, pt supine in bed with no c/o pain. Pt performed supine >sit with supervision. While seated EOB OT educated and demonstrated B UE strengthening exercises with use of green,level 3 theraband. Pt returning demonstrations for 3 sets of 15 bicep curls and alternating punches. Pt requiring rest break between sets secondary to fatigue. OT educating pt on signs, symptoms, and risk  factors for CVA with pt verbalizing understanding. Pt returned to supine to rest until next session with supervision. Call bell and all needed items within reach. Bed alarm activated.   Therapy Documentation Precautions:  Precautions Precautions: Fall Restrictions Weight Bearing Restrictions: No  See FIM for current functional status  Therapy/Group: Individual Therapy  Lowella Gripittman, Kenishia Plack L 06/15/2015, 11:59 AM

## 2015-06-15 NOTE — Progress Notes (Signed)
Speech Language Pathology Daily Session Note  Patient Details  Name: Jacob White MRN: [id number] Date of Birth: 03/13/1946  Today's Date: 06/15/2015 SLP Individual Time: 0900-1000 SLP Individual Time Calculation (min): 60 min  Short Term Goals: Week 1: SLP Short Term Goal 1 (Week 1): Pt will utilize environmental aids to reorient to date with supervision.  SLP Short Term Goal 2 (Week 1): Pt will utilize external aids to facilitate recall of daily information with min verbal and visual cues.   SLP Short Term Goal 3 (Week 1): Pt will complete basic self care and/or home management tasks with min verbal cues for functional problem solving.    Skilled Therapeutic Interventions:  Pt was seen for skilled ST targeting cognitive goals.  Upon arrival, pt was reclined in bed, awake, alert, and agreeable to participate in ST.  Pt ambulated to the day room with min assist for safety awareness.  SLP facilitated the session with structured medication and financial management tasks to address functional problem solving for home management and self care.  Pt counted money with supervision-min assist verbal cues for thought organization and working memory.  Pt had difficulty making change or adding amounts of money together; however, per report this is baseline.   Pt was able to follow directions on a medication label and place pills of varying dosages and frequencies into a medication chart with min verbal cues for error awareness and organization.  SLP informed pt's wife that pt will likely need assistance for both medication and financial management at discharge.  Pt's wife was in agreement and reported that she was helping pt manage his medication prior to admission.  Pt left upright in wheelchair with wife in room.  Continue per current plan of care.   FIM:  Comprehension Comprehension Mode: Auditory Comprehension: 5-Understands basic 90% of the time/requires cueing < 10% of the time Expression Expression  Mode: Verbal Expression: 5-Expresses basic 90% of the time/requires cueing < 10% of the time. Social Interaction Social Interaction: 6-Interacts appropriately with others with medication or extra time (anti-anxiety, antidepressant). Problem Solving Problem Solving: 3-Solves basic 50 - 74% of the time/requires cueing 25 - 49% of the time Memory Memory: 4-Recognizes or recalls 75 - 89% of the time/requires cueing 10 - 24% of the time  Pain Pain Assessment Pain Assessment: No/denies pain  Therapy/Group: Individual Therapy  Codylee Patil, Melanee SpryNicole L 06/15/2015, 1:24 PM

## 2015-06-15 NOTE — Progress Notes (Signed)
Social Work Patient ID: Jacob White, male   DOB: 06/09/1946, 69 y.o.   MRN: [id number] Asking [facility] to check benefits for IV antibiotics at home.  Pt is doing well here, but will need IV antibiotics until 7/11. [facility] to get back with this worker.

## 2015-06-15 NOTE — Progress Notes (Signed)
ANTIBIOTIC CONSULT NOTE - FOLLOW UP  Pharmacy Consult for Vancomycin Indication: MRSA bacteremia  No Known Allergies  Patient Measurements: Height: 5\' 6"  (167.6 cm) Weight: 131 lb 6.3 oz (59.6 kg) IBW/kg (Calculated) : 63.8  Vital Signs: Temp: 98.3 F (36.8 C) (07/01 1439) Temp Source: Oral (07/01 1439) BP: 111/70 mmHg (07/01 1439) Pulse Rate: 67 (07/01 1439) Intake/Output from previous day: 06/30 0701 - 07/01 0700 In: 720 [P.O.:720] Out: 750 [Urine:750] Intake/Output from this shift: Total I/O In: 2690 [P.O.:2690] Out: 350 [Urine:350]  Labs:  Recent Labs  06/13/15 0522 06/14/15 0515 06/15/15 0458  WBC  --  8.6  --   HGB  --  12.2*  --   PLT  --  200  --   CREATININE 1.00 0.94 0.95   Estimated Creatinine Clearance: 61 mL/min (by C-G formula based on Cr of 0.95).  Recent Labs  06/15/15 1645  VANCOTROUGH 15     Microbiology: Recent Results (from the past 720 hour(s))  Blood culture (routine x 2)     Status: None   Collection Time: 06/08/15  8:10 PM  Result Value Ref Range Status   Specimen Description BLOOD RIGHT HAND  Final   Special Requests BOTTLES DRAWN AEROBIC AND ANAEROBIC 5CC  Final   Culture  Setup Time   Final    GRAM POSITIVE COCCI IN PAIRS IN BOTH AEROBIC AND ANAEROBIC BOTTLES CRITICAL RESULT CALLED TO, READ BACK BY AND VERIFIED WITH: AROYAL,RN AT 1053 06/09/15 BY LBENFIELD    Culture METHICILLIN RESISTANT STAPHYLOCOCCUS AUREUS  Final   Report Status 06/11/2015 FINAL  Final   Organism ID, Bacteria METHICILLIN RESISTANT STAPHYLOCOCCUS AUREUS  Final      Susceptibility   Methicillin resistant staphylococcus aureus - MIC*    CIPROFLOXACIN >=8 RESISTANT Resistant     ERYTHROMYCIN <=0.25 SENSITIVE Sensitive     GENTAMICIN <=0.5 SENSITIVE Sensitive     OXACILLIN >=4 RESISTANT Resistant     TETRACYCLINE <=1 SENSITIVE Sensitive     VANCOMYCIN <=0.5 SENSITIVE Sensitive     TRIMETH/SULFA <=10 SENSITIVE Sensitive     CLINDAMYCIN <=0.25 SENSITIVE  Sensitive     RIFAMPIN <=0.5 SENSITIVE Sensitive     Inducible Clindamycin NEGATIVE Sensitive     * METHICILLIN RESISTANT STAPHYLOCOCCUS AUREUS  Blood culture (routine x 2)     Status: None   Collection Time: 06/08/15  8:15 PM  Result Value Ref Range Status   Specimen Description BLOOD LEFT HAND  Final   Special Requests BOTTLES DRAWN AEROBIC AND ANAEROBIC 5CC  Final   Culture  Setup Time   Final    GRAM POSITIVE COCCI IN CLUSTERS IN BOTH AEROBIC AND ANAEROBIC BOTTLES CRITICAL RESULT CALLED TO, READ BACK BY AND VERIFIED WITH: AROYAL,RN AT 1153 06/09/15 BY LBENFIELD    Culture   Final    METHICILLIN RESISTANT STAPHYLOCOCCUS AUREUS SUSCEPTIBILITIES PERFORMED ON PREVIOUS CULTURE WITHIN THE LAST 5 DAYS.    Report Status 06/11/2015 FINAL  Final  Culture, blood (routine x 2)     Status: None (Preliminary result)   Collection Time: 06/11/15  4:50 AM  Result Value Ref Range Status   Specimen Description BLOOD RIGHT ARM  Final   Special Requests BOTTLES DRAWN AEROBIC AND ANAEROBIC 8CC 8CC  Final   Culture NO GROWTH 4 DAYS  Final   Report Status PENDING  Incomplete  Culture, blood (routine x 2)     Status: None (Preliminary result)   Collection Time: 06/11/15  5:08 AM  Result Value Ref Range Status  Specimen Description BLOOD RIGHT ARM  Final   Special Requests BOTTLES DRAWN AEROBIC ONLY 7CC  Final   Culture NO GROWTH 4 DAYS  Final   Report Status PENDING  Incomplete  Urine culture     Status: None   Collection Time: 06/11/15 11:04 AM  Result Value Ref Range Status   Specimen Description URINE, RANDOM  Final   Special Requests NONE  Final   Culture   Final    MULTIPLE SPECIES PRESENT, SUGGEST RECOLLECTION IF CLINICALLY INDICATED   Report Status 06/12/2015 FINAL  Final  MRSA PCR Screening     Status: Abnormal   Collection Time: 06/12/15  8:37 AM  Result Value Ref Range Status   MRSA by PCR POSITIVE (A) NEGATIVE Final    Comment:        The GeneXpert MRSA Assay (FDA approved for  NASAL specimens only), is one component of a comprehensive MRSA colonization surveillance program. It is not intended to diagnose MRSA infection nor to guide or monitor treatment for MRSA infections.     Anti-infectives    Start     Dose/Rate Route Frequency Ordered Stop   06/14/15 0300  vancomycin (VANCOCIN) IVPB 750 mg/150 ml premix     750 mg 150 mL/hr over 60 Minutes Intravenous Every 12 hours 06/13/15 1628        Assessment: 68 YOM who continues on Vancomycin for MRSA bacteremia. TEE did not show a vegetation. ID planning to treat thru 7/11. Vancomycin trough this evening is therapeutic (VT 15 mcg/ml, goal of 15-20 mcg/ml). Renal function stable - current dose remains appropriate.   Goal of Therapy:  Vancomycin trough level 15-20 mcg/ml  Plan:  1. Continue Vancomycin 750 mg IV every 12 hours 2. Noted ID plans to continue through 7/11 3. Will continue to follow renal function for any necessary dose adjustments    Georgina Pillion, PharmD, BCPS Clinical Pharmacist Pager: [phone] 06/15/2015 6:09 PM

## 2015-06-16 ENCOUNTER — Encounter (HOSPITAL_COMMUNITY): Payer: Medicare Other | Admitting: Occupational Therapy

## 2015-06-16 ENCOUNTER — Inpatient Hospital Stay (HOSPITAL_COMMUNITY): Payer: Medicare Other | Admitting: Speech Pathology

## 2015-06-16 ENCOUNTER — Inpatient Hospital Stay (HOSPITAL_COMMUNITY): Payer: Medicare Other | Admitting: Physical Therapy

## 2015-06-16 ENCOUNTER — Inpatient Hospital Stay (HOSPITAL_COMMUNITY): Payer: Medicare Other | Admitting: Occupational Therapy

## 2015-06-16 LAB — GLUCOSE, CAPILLARY
GLUCOSE-CAPILLARY: 186 mg/dL — AB (ref 65–99)
Glucose-Capillary: 189 mg/dL — ABNORMAL HIGH (ref 65–99)
Glucose-Capillary: 208 mg/dL — ABNORMAL HIGH (ref 65–99)
Glucose-Capillary: 170 mg/dL — ABNORMAL HIGH (ref 65–99)

## 2015-06-16 LAB — CULTURE, BLOOD (ROUTINE X 2)
Culture: NO GROWTH
Culture: NO GROWTH

## 2015-06-16 NOTE — Progress Notes (Signed)
69 y.o. male admitted to [facility] on 06/08/2015 after seeing his primary care physician for right shoulder pain. The patient injured his shoulder while trying to move a picnic table while sitting on his riding mower. During that visit the patient's wife reported that the patient has been very lethargic recently and has been increasingly confused over the past 3-4 weeks. 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 Neurology feels infarcts likely due to chronic small vessel disease and to rule out infectious source. ASA added for secondary stroke prevention. 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 He is feeling well. Objective: Vital Signs: Blood pressure 118/72, pulse 67, temperature 98.5 F (36.9 C), temperature source Oral, resp. rate 18, height 5\' 6"  (1.676 m), weight 131 lb 6.3 oz (59.6 kg), SpO2 94 %. No results found.  Thin male in no acute distress. Chest clear to auscultation HEENT exam: Atraumatic, normocephalic cardiac exam S1 and S2 are regular abd:: Thin, active bowel sounds Extremities no edema Assessment/Plan: 1. Functional deficits secondary to right fontal-parietal infarct with left hemiparesis, in a patient with severe diabetic neuropathy complicated by MRSA bacteremia    Medical Problem List and Plan: 1. Functional deficits secondary to right fontal-parietal infarct complicated by MRSA bacteremia (?source) 2.  DVT Prophylaxis/Anticoagulation: Pharmaceutical: Lovenox 3. Pain Management: denies pain  4. Mood: LCSW to follow for evaluation and support.   5. Neuropsych: This patient is not capable of making decisions on his own behalf. 6. Skin/Wound Care: Routine pressure relief measures.   7. Fluids/Electrolytes/Nutrition: Monitor I/O. Check lytes in am.   8. MRSA Bacteremia: Continue Vancomycin  through July 11 th with trough goal 15-20 as long as blood cultures remain negative.      10 Hypokalemia: resolved Lab Results  Component Value Date   K 4.0 06/15/2015   Basic Metabolic Panel:    Component Value Date/Time   NA 133* 06/15/2015 0458   NA 137 01/03/2013 0035   K 4.0 06/15/2015 0458   K 3.8 01/03/2013 0035   CL 98* 06/15/2015 0458   CL 102 01/03/2013 0035   CO2 28 06/15/2015 0458   CO2 26 01/03/2013 0035   BUN 19 06/15/2015 0458   BUN 26* 01/03/2013 0035   CREATININE 0.95 06/15/2015 0458   CREATININE 1.10 01/03/2013 0035   GLUCOSE 207* 06/15/2015 0458   GLUCOSE 386* 01/03/2013 0035   CALCIUM 8.1* 06/15/2015 0458   CALCIUM 9.3 01/03/2013 0035    11. DM type 2 with polyneuropathy    uncontrolled : Monitor BS ac/hs.  CBG (last 3)   Recent Labs  06/15/15 1634 06/15/15 2059 06/16/15 0656  GLUCAP 196* 139* 189*    12. ZOX:WRUEAHTN:Blood pressure currently controlled.   LOS (Days) 3 A FACE TO FACE EVALUATION WAS PERFORMED  SWORDS,BRUCE HENRY 06/16/2015, 8:30 AM

## 2015-06-16 NOTE — Progress Notes (Signed)
Speech Language Pathology Daily Session Note  Patient Details  Name: Jacob White MRN: [id number] Date of Birth: 12/21/1945  Today's Date: 06/16/2015 SLP Individual Time: [id number] SLP Individual Time Calculation (min): 45 min  Short Term Goals: Week 1: SLP Short Term Goal 1 (Week 1): Pt will utilize environmental aids to reorient to date with supervision.  SLP Short Term Goal 2 (Week 1): Pt will utilize external aids to facilitate recall of daily information with min verbal and visual cues.   SLP Short Term Goal 3 (Week 1): Pt will complete basic self care and/or home management tasks with min verbal cues for functional problem solving.    Skilled Therapeutic Interventions: Skilled ST intervention provided with focus on cognitive goals. Pt seen in room for ST tx session, seated upright in w/c. Pt completed 4-step sequencing tasks with 90% accuracy, min A, given additional time. Pt states that he has reading glasses which are at home, therefore required additional time and explanations to clearly see pictures. Pt independently locked brakes on w/c x2 throughout session. Pt demonstrated independence with call bell and stated 3 reasons to request assistance, min A. Slp and pt discussed home safety awareness and identified 3 areas in his home that may be fall risks. Pt verbalized appropriate solutions to potentially dangerous situations with min A.    FIM:  Comprehension Comprehension Mode: Auditory Comprehension: 5-Follows basic conversation/direction: With no assist Expression Expression Mode: Verbal Expression: 5-Expresses basic 90% of the time/requires cueing < 10% of the time. Social Interaction Social Interaction: 6-Interacts appropriately with others with medication or extra time (anti-anxiety, antidepressant). Problem Solving Problem Solving: 4-Solves basic 75 - 89% of the time/requires cueing 10 - 24% of the time Memory Memory: 4-Recognizes or recalls 75 - 89% of the time/requires  cueing 10 - 24% of the time  Pain Pain Assessment Pain Assessment: No/denies pain  Therapy/Group: Individual Therapy  Amisadai Woodford, Kara PacerLeah N 06/16/2015, 11:19 AM

## 2015-06-16 NOTE — Progress Notes (Signed)
Physical Therapy Session Note  Patient Details  Name: Jacob White MRN: [id number] Date of Birth: 09/25/1946  Today's Date: 06/16/2015 PT Individual Time: 1300-1400 PT Individual Time Calculation (min): 60 min   Short Term Goals: Week 1:  PT Short Term Goal 1 (Week 1): STGs = LTGs due to ELOS  Skilled Therapeutic Interventions/Progress Updates:  Pt was seen bedside in the pm. Pt performed multiple sit to stand transfers with S and verbal cues. Pt ambulated 200 feet without assistive device, min guard to S with no LOB. Pt performed Nu-step x 10 mins with rest breaks x 2, level 2. Pt performed cone taps and criss cross cone taps 3 sets x 10 reps each. Pt performed slalom course 50 feet x 2, side stepping 50 feet x2 and walking backwards 50 x 1 with min guard to S. Pt ambulated back to room about 200 feet with min guard to S. Pt left sitting up in w/c with call bell within reach.   Therapy Documentation Precautions:  Precautions Precautions: Fall Restrictions Weight Bearing Restrictions: No General:   Pain: No c/o pain.    Locomotion : Ambulation Ambulation/Gait Assistance: 4: Min guard;5: Supervision   See FIM for current functional status  Therapy/Group: Individual Therapy  Rayford HalstedMitchell, [name] 06/16/2015, 3:41 PM

## 2015-06-16 NOTE — Progress Notes (Signed)
Occupational Therapy Session Note  Patient Details  Name: Jacob White MRN: [id number] Date of Birth: 07/02/1946  Today's Date: 06/16/2015 OT Individual Time: [phone] and 1430-1509 OT Individual Time Calculation (min): 60 min and 39 min   Short Term Goals: Week 1:  OT Short Term Goal 1 (Week 1): STG=LTGs secondary to estimated short LOS  Skilled Therapeutic Interventions/Progress Updates:  Session 1:Upon entering the room, pt supine in bed with no c/o pain. Pt performing supine >sit with supervision. Pt ambulating without walker in room to obtain clothing from closet and dresser with SBA - min guard. Pt returned to bed with clothing items for dressing with supervision for STS and clothing management. Pt required min verbal cues for safety such as not standing in slippery socks to pull up pants secondary to fall risk. Once dressed, pt ambulated with SBA and use of rail on R side down hallway to day room. Occasional steady assist when no rail available. Pt seated on edge of mat for 10 reps of STS without use of UEs. Next, pt standing at table for leisure card game. Pt standing with B hands engaged in task (unsupported) and reaching to the L and R to obtain cards as needed with no LOB and supervision. Pt also having to shuffle cards and report directions to therapist after playing the first time with min verbal guidance cues for recall. Pt returning to room in same manner as above and seated in wheelchair with call bell and all needed items within reach. Family members entering the room as therapist exiting.   Session 2: Upon entering the room, pt seated in wheelchair with no c/o pain. Pt propelled wheelchair via B UEs for strengthening down to lobby and outdoors with 2 rest breaks secondary to fatigue. Pt ambulated with min A hand held for balance downhill and into grassy knoll. Pt with no LOB but required multiple verbal cues to decrease speed of ambulation as pt began rushing frantically down grassy  hill. Pt also ambulated up and down 5 steps with use of rail with SBA for safety. Pt seated in wheelchair and therapist assisted pt back to therapy floor. Pt returned to room with call bell and all needed items within reach.    Therapy Documentation Precautions:  Precautions Precautions: Fall Restrictions Weight Bearing Restrictions: No Vital Signs: Therapy Vitals Temp: 98.5 F (36.9 C) Temp Source: Oral Pulse Rate: 67 Resp: 18 BP: 118/72 mmHg Patient Position (if appropriate): Orthostatic Vitals Oxygen Therapy SpO2: 94 % O2 Device: Not Delivered  See FIM for current functional status  Therapy/Group: Individual Therapy  Lowella Gripittman, Sabryn Preslar L 06/16/2015, 9:52 AM

## 2015-06-17 ENCOUNTER — Inpatient Hospital Stay (HOSPITAL_COMMUNITY): Payer: Medicare Other | Admitting: Occupational Therapy

## 2015-06-17 ENCOUNTER — Inpatient Hospital Stay (HOSPITAL_COMMUNITY): Payer: Medicare Other | Admitting: Physical Therapy

## 2015-06-17 LAB — GLUCOSE, CAPILLARY
GLUCOSE-CAPILLARY: 204 mg/dL — AB (ref 65–99)
Glucose-Capillary: 188 mg/dL — ABNORMAL HIGH (ref 65–99)
Glucose-Capillary: 212 mg/dL — ABNORMAL HIGH (ref 65–99)
Glucose-Capillary: 194 mg/dL — ABNORMAL HIGH (ref 65–99)

## 2015-06-17 MED ORDER — INSULIN GLARGINE 100 UNIT/ML ~~LOC~~ SOLN
8.0000 [IU] | Freq: Every day | SUBCUTANEOUS | Status: DC
  Administered 2015-06-17 – 2015-06-20 (×4): 8 [IU] via SUBCUTANEOUS
  Filled 2015-06-17 (×4): qty 0.08

## 2015-06-17 NOTE — Progress Notes (Signed)
Occupational Therapy Session Note  Patient Details  Name: Jacob White MRN: [id number] Date of Birth: 04/29/1946  Today's Date: 06/17/2015 OT Individual Time:  -   1615- 1700  (45 min)      Short Term Goals: Week 1:  OT Short Term Goal 1 (Week 1): STG=LTGs secondary to estimated short LOS      Skilled Therapeutic Interventions/Progress Updates:    Pt. Ambulated to gym. Performed SCi FIt for following times:  :  5 mins. Sit; . 2 min rest, ;  4 min standing,;   2 rest;  , 4 min sit, ;  2 min rest, 4 min standing.  Pt. Ambulated back to room.  Left with all needs in place.      Therapy Documentation Precautions:  Precautions Precautions: Fall Restrictions Weight Bearing Restrictions: No      Pain:  none      Exercises:Utilized SCI Fit for 17 mins with 3 , 2 minute rest breaks       See FIM for current functional status  Therapy/Group: Individual Therapy  Humberto Sealsdwards, Bond Grieshop J 06/17/2015, 7:54 AM

## 2015-06-17 NOTE — Progress Notes (Signed)
Physical Therapy Session Note  Patient Details  Name: Jacob White MRN: [id number] Date of Birth: 06/19/1946  Today's Date: 06/17/2015 PT Individual Time: 1445-1530 PT Individual Time Calculation (min): 45 min   Short Term Goals: Week 1:  PT Short Term Goal 1 (Week 1): STGs = LTGs due to ELOS  Skilled Therapeutic Interventions/Progress Updates:  Pt transferred supine to edge of bed with side rail and S. Pt transferred sit to stand with S. Pt ambulated about 200 feet with S and verbal cues. Pt performed cone taps and criss cross cone taps, 3 sets x 10 reps each for strengthening. Pt rode Nu-step x 10 minutes at level 3, no rest breaks. Pt ascended/descended 12 stairs with 1 rail and S. Pt ambulated back to room with S and no assistive device. Pt left sitting up in w/c with call bell within reach.   Therapy Documentation Precautions:  Precautions Precautions: Fall Restrictions Weight Bearing Restrictions: No General:   Pain: No c/o pain.    Locomotion : Ambulation Ambulation/Gait Assistance: 5: Supervision   See FIM for current functional status  Therapy/Group: Individual Therapy  Rayford HalstedMitchell, Katelind Pytel G 06/17/2015, 3:43 PM

## 2015-06-17 NOTE — Progress Notes (Signed)
69 y.o. male admitted to [facility]  on 06/08/2015 after seeing his primary care physician for right shoulder pain. The patient injured his shoulder while trying to move a picnic table while sitting on his riding mower. During that visit the patient's wife reported that the patient has been very lethargic recently and has been increasingly confused over the past 3-4 weeks. 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 Neurology feels infarcts likely due to chronic small vessel disease and to rule out infectious source. ASA added for secondary stroke prevention. 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 rate 18, height 5\' 6"  (1.676 m), weight 131 lb 6.3 oz (59.6 kg), SpO2 98 %. No results found.  Thin male in no acute distress. Chest clear to auscultation HEENT exam: Atraumatic, normocephalic cardiac exam S1 and S2 are regular abd:: Thin, active bowel sounds Extremities no edema Assessment/Plan: 1. Functional deficits secondary to right fontal-parietal infarct with left hemiparesis, in a patient with severe diabetic neuropathy complicated by MRSA bacteremia    Medical Problem List and Plan: 1. Functional deficits secondary to right fontal-parietal infarct complicated by MRSA bacteremia (?source) 2.  DVT Prophylaxis/Anticoagulation: Pharmaceutical: Lovenox 3. Pain Management: denies pain  4. Mood: LCSW to follow for evaluation and support.   5. Neuropsych: This patient is not capable of making decisions on his own behalf. 6. Skin/Wound Care: Routine pressure relief measures.   7. Fluids/Electrolytes/Nutrition: .   8. MRSA Bacteremia: Continue Vancomycin through July 11 th with trough goal 15-20 as long as blood  cultures remain negative.      10 Hypokalemia: resolved Lab Results  Component Value Date   K 4.0 06/15/2015   Basic Metabolic Panel:    Component Value Date/Time   NA 133* 06/15/2015 0458   NA 137 01/03/2013 0035   K 4.0 06/15/2015 0458   K 3.8 01/03/2013 0035   CL 98* 06/15/2015 0458   CL 102 01/03/2013 0035   CO2 28 06/15/2015 0458   CO2 26 01/03/2013 0035   BUN 19 06/15/2015 0458   BUN 26* 01/03/2013 0035   CREATININE 0.95 06/15/2015 0458   CREATININE 1.10 01/03/2013 0035   GLUCOSE 207* 06/15/2015 0458   GLUCOSE 386* 01/03/2013 0035   CALCIUM 8.1* 06/15/2015 0458   CALCIUM 9.3 01/03/2013 0035    11. DM type 2 with polyneuropathy     : Monitor BS ac/hs.  CBG (last 3)   Recent Labs  06/16/15 1629 06/16/15 2150 06/17/15 0709  GLUCAP 186* 170* 188*  will increase lantus  12. ZOX:WRUEAHTN:Blood pressure currently controlled.   LOS (Days) 4 A FACE TO FACE EVALUATION WAS PERFORMED  SWORDS,BRUCE HENRY 06/17/2015, 7:48 AM

## 2015-06-18 ENCOUNTER — Inpatient Hospital Stay (HOSPITAL_COMMUNITY): Payer: Medicare Other | Admitting: Rehabilitation

## 2015-06-18 ENCOUNTER — Inpatient Hospital Stay (HOSPITAL_COMMUNITY): Payer: Medicare Other

## 2015-06-18 ENCOUNTER — Inpatient Hospital Stay (HOSPITAL_COMMUNITY): Payer: Medicare Other | Admitting: Speech Pathology

## 2015-06-18 LAB — GLUCOSE, CAPILLARY
GLUCOSE-CAPILLARY: 198 mg/dL — AB (ref 65–99)
GLUCOSE-CAPILLARY: 127 mg/dL — AB (ref 65–99)
Glucose-Capillary: 244 mg/dL — ABNORMAL HIGH (ref 65–99)
Glucose-Capillary: 231 mg/dL — ABNORMAL HIGH (ref 65–99)

## 2015-06-18 MED ORDER — METFORMIN HCL 500 MG PO TABS
1000.0000 mg | ORAL_TABLET | Freq: Two times a day (BID) | ORAL | Status: DC
  Administered 2015-06-18 – 2015-06-20 (×4): 1000 mg via ORAL
  Filled 2015-06-18 (×6): qty 2

## 2015-06-18 NOTE — Progress Notes (Signed)
69 y.o. male admitted to [facility] on 06/08/2015 after seeing his primary care physician for right shoulder pain. The patient injured his shoulder while trying to move a picnic table while sitting on his riding mower. During that visit the patient's wife reported that the patient has been very lethargic recently and has been increasingly confused over the past 3-4 weeks. 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 Neurology feels infarcts likely due to chronic small vessel disease and to rule out infectious source. ASA added for secondary stroke prevention. RIght shoulder MRI without evidence of infection, there is rotator cuff degeneration  Subjective/Complaints: Feeling well. Had quiet weekend. Anxious to get home!  ROS: Pt denies fever, rash/itching, headache, blurred or double vision, nausea, vomiting, abdominal pain, diarrhea, chest pain, shortness of breath, palpitations, dysuria, dizziness, neck or back pain, bleeding, anxiety, or depression   Objective: Vital Signs: Blood pressure 108/65, pulse 68, temperature 98.4 F (36.9 C), temperature source Oral, resp. rate 18, height 5\' 6"  (1.676 m), weight 59.6 kg (131 lb 6.3 oz), SpO2 95 %. No results found. Results for orders placed or performed during the hospital encounter of 06/13/15 (from the past 72 hour(s))  Glucose, capillary     Status: Abnormal   Collection Time: 06/15/15 11:39 AM  Result Value Ref Range   Glucose-Capillary 227 (H) 65 - 99 mg/dL  Glucose, capillary     Status: Abnormal   Collection Time: 06/15/15  4:34 PM  Result Value Ref Range   Glucose-Capillary 196 (H) 65 - 99 mg/dL  Vancomycin, trough     Status: None   Collection Time: 06/15/15  4:45 PM  Result Value Ref Range   Vancomycin Tr 15 10.0 - 20.0 ug/mL  Glucose, capillary     Status: Abnormal   Collection Time: 06/15/15  8:59 PM  Result Value Ref Range   Glucose-Capillary 139  (H) 65 - 99 mg/dL   Comment 1 Notify RN   Glucose, capillary     Status: Abnormal   Collection Time: 06/16/15  6:56 AM  Result Value Ref Range   Glucose-Capillary 189 (H) 65 - 99 mg/dL   Comment 1 Notify RN   Glucose, capillary     Status: Abnormal   Collection Time: 06/16/15 11:30 AM  Result Value Ref Range   Glucose-Capillary 208 (H) 65 - 99 mg/dL   Comment 1 Notify RN   Glucose, capillary     Status: Abnormal   Collection Time: 06/16/15  4:29 PM  Result Value Ref Range   Glucose-Capillary 186 (H) 65 - 99 mg/dL  Glucose, capillary     Status: Abnormal   Collection Time: 06/16/15  9:50 PM  Result Value Ref Range   Glucose-Capillary 170 (H) 65 - 99 mg/dL  Glucose, capillary     Status: Abnormal   Collection Time: 06/17/15  7:09 AM  Result Value Ref Range   Glucose-Capillary 188 (H) 65 - 99 mg/dL  Glucose, capillary     Status: Abnormal   Collection Time: 06/17/15 11:27 AM  Result Value Ref Range   Glucose-Capillary 212 (H) 65 - 99 mg/dL  Glucose, capillary     Status: Abnormal   Collection Time: 06/17/15  5:04 PM  Result Value Ref Range   Glucose-Capillary 194 (H) 65 - 99 mg/dL  Glucose, capillary     Status: Abnormal   Collection Time: 06/17/15  8:47 PM  Result Value Ref Range  Glucose-Capillary 204 (H) 65 - 99 mg/dL  Glucose, capillary     Status: Abnormal   Collection Time: 06/18/15  6:47 AM  Result Value Ref Range   Glucose-Capillary 198 (H) 65 - 99 mg/dL     HEENT: normal Cardio: RRR and no murmurs Resp: CTA B/L and unlabored. No wheezes or rales GI: BS positive and nontender nondistended Extremity:  Pulses positive and No Edema Skin:   Intact Neuro: Alert/Oriented, Abnormal Sensory reduced sensation in bilateral hands and feet, atrophy of the hand and foot intrinsic muscles, Abnormal Motor 4+/5 in the left deltoid, biceps, triceps, grip, hip flexor, knee extensors, ankle dorsiflexion 5/5 on the right side with exception of right grip  which is  4+/5 and Abnormal  FMC Ataxic/ dec FMC Musc/Skel:  Other nno pain with right shoulder range of motion,, hand and foot intrinsic muscle atrophy is noted Gen. no acute distress   Assessment/Plan: 1. Functional deficits secondary to right fontal-parietal infarct with left hemiparesis, in a patient with severe diabetic neuropathy complicated by MRSA bacteremia  which require 3+ hours per day of interdisciplinary therapy in a comprehensive inpatient rehab setting. Physiatrist is providing close team supervision and 24 hour management of active medical problems listed below. Physiatrist and rehab team continue to assess barriers to discharge/monitor patient progress toward functional and medical goals. FIM: FIM - Bathing Bathing Steps Patient Completed: Chest, Right Arm, Left Arm, Abdomen, Front perineal area, Buttocks, Right upper leg, Left upper leg Bathing: 4: Min-Patient completes 8-9 65f 10 parts or 75+ percent  FIM - Upper Body Dressing/Undressing Upper body dressing/undressing steps patient completed: Thread/unthread right sleeve of pullover shirt/dresss, Thread/unthread left sleeve of pullover shirt/dress, Put head through opening of pull over shirt/dress, Pull shirt over trunk Upper body dressing/undressing: 5: Supervision: Safety issues/verbal cues FIM - Lower Body Dressing/Undressing Lower body dressing/undressing steps patient completed: Thread/unthread right underwear leg, Thread/unthread left underwear leg, Pull underwear up/down, Thread/unthread right pants leg, Thread/unthread left pants leg, Pull pants up/down, Fasten/unfasten pants, Don/Doff right sock, Don/Doff left sock, Don/Doff right shoe, Don/Doff left shoe, Fasten/unfasten right shoe, Fasten/unfasten left shoe Lower body dressing/undressing: 5: Supervision: Safety issues/verbal cues        FIM - Banker Devices: Bed rails Bed/Chair Transfer: 5: Supine > Sit: Supervision (verbal cues/safety  issues)  FIM - Locomotion: Wheelchair Distance: 200 Locomotion: Wheelchair: 0: Activity did not occur FIM - Locomotion: Ambulation Locomotion: Ambulation Assistive Devices: Other (comment) (no device) Ambulation/Gait Assistance: 5: Supervision Locomotion: Ambulation: 5: Travels 150 ft or more with supervision/safety issues  Comprehension Comprehension Mode: Auditory Comprehension: 5-Follows basic conversation/direction: With no assist  Expression Expression Mode: Verbal Expression: 5-Expresses basic 90% of the time/requires cueing < 10% of the time.  Social Interaction Social Interaction: 6-Interacts appropriately with others with medication or extra time (anti-anxiety, antidepressant).  Problem Solving Problem Solving: 4-Solves basic 75 - 89% of the time/requires cueing 10 - 24% of the time  Memory Memory: 4-Recognizes or recalls 75 - 89% of the time/requires cueing 10 - 24% of the time  Medical Problem List and Plan: 1. Functional deficits secondary to right fontal-parietal infarct complicated by MRSA bacteremia (?source) 2.  DVT Prophylaxis/Anticoagulation: Pharmaceutical: Lovenox 3. Pain Management: Denies any back or shoulder pain. Will use Tylenol prn.   4. Mood: LCSW to follow for evaluation and support.   5. Neuropsych: This patient is not capable of making decisions on his own behalf. 6. Skin/Wound Care: Routine pressure relief measures.   7. Fluids/Electrolytes/Nutrition: Monitor  I/O. Check lytes in am. Good po at present   8. MRSA Bacteremia: Continue Vancomycin through July 11 th with trough goal 15-20 as long as blood cultures remain negative.      10 Hypokalemia: Continue supplement for now. Check labs in am Tuesday.  .   11. DM type 2 with polyneuropathy: remains uncontrolled :   lantus increased to 8u on Sunday.   - Increase metformin to 1000mg  bid 12. HTN: bp under control. Continue prinivil. holding HCTZ due to continued drop in Na+   LOS (Days) 5 A FACE  TO FACE EVALUATION WAS PERFORMED  Danzel Marszalek T 06/18/2015, 9:44 AM

## 2015-06-18 NOTE — Progress Notes (Signed)
Physical Therapy Session Note  Patient Details  Name: Jacob White MRN: [id number] Date of Birth: 02/26/1946  Today's Date: 06/18/2015 PT Individual Time: 1430-1500 PT Individual Time Calculation (min): 30 min   Short Term Goals: Week 1:  PT Short Term Goal 1 (Week 1): STGs = LTGs due to ELOS  Skilled Therapeutic Interventions/Progress Updates:   Pt received sitting in recliner in room, family present but did not attend session.  Pt ambulated to sink and throughout unit >300' at S to mod I level with min cues for safety when in busy environment.  Skilled session focused on dynamic balance and improvement with hip and ankle strategies as well as seated therex for improved strengthening and activity tolerance.  Performed standing task on large Biodex foam pad while tossing ball to rebounder with feet apart>feet together>feet semi tandem (switching which leg is behind) x 30 secs each.  Pt unable to toss ball during tandem and simply worked on maintaining balance.  Requires intermittent min/guard assist during tandem to maintain balance.  Performed floor recovery in case of fall at home with education on when to perform vs when to call 911.  Pt requires mod A to come up with scenarios to call 911 but performed transfer at S level.  Ended session with seated nustep x 6 mins with BLEs only as mentioned above.  Ambulated back to room and left in recliner with all needs in reach.    Therapy Documentation Precautions:  Precautions Precautions: Fall Restrictions Weight Bearing Restrictions: No   Pain: Pain Assessment Pain Assessment: No/denies pain  See FIM for current functional status  Therapy/Group: Individual Therapy  [name], [name] 06/18/2015, 12:59 PM

## 2015-06-18 NOTE — Progress Notes (Signed)
Speech Language Pathology Daily Session Note  Patient Details  Name: Jacob White J Casavant MRN: [id number] Date of Birth: 01/05/1946  Today's Date: 06/18/2015 SLP Individual Time: 0930-1030 SLP Individual Time Calculation (min): 60 min  Short Term Goals: Week 1: SLP Short Term Goal 1 (Week 1): Pt will utilize environmental aids to reorient to date with supervision.  SLP Short Term Goal 2 (Week 1): Pt will utilize external aids to facilitate recall of daily information with min verbal and visual cues.   SLP Short Term Goal 3 (Week 1): Pt will complete basic self care and/or home management tasks with min verbal cues for functional problem solving.    Skilled Therapeutic Interventions: Skilled treatment session focused on addressing cognitive goals and patient education. Patient was Mod I, requiring increased time for orientation to date.  Patient completed basic money management problem solving tasks with Min verbal cues to recognize errors and increased time to correct errors.  Patient was provided with a list of items/locations to locate on unit which he did with Min verbal and visual cues to utilize aid in the moment.  SLP initiated education regarding effective recall strategies to use for home management and carryover; patient verbalized understanding, but there was no family present. Recommend education with wife tomorrow prior to discharge to ensure Supervision assist with medication and financial management upon discharge.      FIM:  Comprehension Comprehension Mode: Auditory Comprehension: 5-Follows basic conversation/direction: With no assist Expression Expression Mode: Verbal Expression: 5-Expresses basic needs/ideas: With no assist Social Interaction Social Interaction: 6-Interacts appropriately with others with medication or extra time (anti-anxiety, antidepressant). Problem Solving Problem Solving: 5-Solves basic 90% of the time/requires cueing < 10% of the time Memory Memory: 6-More  than reasonable amt of time  Pain Pain Assessment Pain Assessment: No/denies pain  Therapy/Group: Individual Therapy  Charlane FerrettiMelissa Johm Pfannenstiel, M.A., CCC-SLP [phone]  Jakai Onofre 06/18/2015, 10:00 AM

## 2015-06-18 NOTE — Progress Notes (Signed)
Social Work Patient ID: Jacob White, male   DOB: 12/03/1946, 69 y.o.   MRN: [id number] Spoke with wife to inform of team's feeling pt will be ready for discharge Wed. She has observed him in therapies and feels she has a good understanding of his needs. She is pleased with the progress but worries about the infection and wanted to know if it was gone. Discussed he will follow up with I & D MD and needs antibiotic until 7/11. Will work toward discharge Wed.  Pt will have 24 hr care at home.

## 2015-06-18 NOTE — Progress Notes (Signed)
Occupational Therapy Session Note  Patient Details  Name: Jacob White MRN: [id number] Date of Birth: 09/20/1946  Today's Date: 06/18/2015 OT Individual Time: 1100-1200 OT Individual Time Calculation (min): 60 min   Short Term Goals: Week 1:  OT Short Term Goal 1 (Week 1): STG=LTGs secondary to estimated short LOS  Skilled Therapeutic Interventions/Progress Updates: ADL-retraining with focus on dynamic standing balance, safety awareness, and endurance.   Pt received seated in recliner, awaiting therapist and receptive to planned activity (bathing/dressing at shower level).  Without use of gait aid, pt ambulates in room with supervision to gather clothing and proceed to bathroom.   Pt toilets for large BM and manages clothing and hygiene independently.   Pt transfers to tub bench and bathes, sitting and standing with only setup to adjust water temp.   Pt returns to toilet to dress lower body with min assist to steady while standing to don underwear.  Pt educated on dressing seated first and then standing to pull up pants.   Pt completes dressing lower body seated and ambulates to sink to groom standing (shaving and brushing his teeth).   Pt requires extra time and use of safety razor to shave unassisted.   Pt returns to recliner and manages meal tray independently at end of session with call light within reach.     Therapy Documentation Precautions:  Precautions Precautions: Fall Restrictions Weight Bearing Restrictions: No   Pain: Pain Assessment Pain Assessment: No/denies pain   See FIM for current functional status  Therapy/Group: Individual Therapy  Collyn Ribas 06/18/2015, 12:17 PM

## 2015-06-18 NOTE — Progress Notes (Signed)
ANTIBIOTIC CONSULT NOTE - FOLLOW UP  Pharmacy Consult for Vancomycin Indication: MRSA bacteremia  No Known Allergies  Patient Measurements: Height:  (167.6 cm) Weight: 131 lb 6.3 oz (59.6 kg) IBW/kg (Calculated) : 63.8  Vital Signs: Temp: 98.4 F (36.9 C) (07/04 0610) Temp Source: Oral (07/04 0610) BP: 108/65 mmHg (07/04 0610) Pulse Rate: 68 (07/04 0610) Intake/Output from previous day: 07/03 0701 - 07/04 0700 In: 1080 [P.O.:1080] Out: 1550 [Urine:1550] Intake/Output from this shift:    Labs: No results for input(s): WBC, HGB, PLT, LABCREA, CREATININE in the last 72 hours. Estimated Creatinine Clearance: 61 mL/min (by C-G formula based on Cr of 0.95).  Recent Labs  06/15/15 1645  VANCOTROUGH 15     Microbiology: Recent Results (from the past 720 hour(s))  Blood culture (routine x 2)     Status: None   Collection Time: 06/08/15  8:10 PM  Result Value Ref Range Status   Specimen Description BLOOD RIGHT HAND  Final   Special Requests BOTTLES DRAWN AEROBIC AND ANAEROBIC 5CC  Final   Culture  Setup Time   Final    GRAM POSITIVE COCCI IN PAIRS IN BOTH AEROBIC AND ANAEROBIC BOTTLES CRITICAL RESULT CALLED TO, READ BACK BY AND VERIFIED WITH: AROYAL,RN AT 1053 06/09/15 BY LBENFIELD    Culture METHICILLIN RESISTANT STAPHYLOCOCCUS AUREUS  Final   Report Status 06/11/2015 FINAL  Final   Organism ID, Bacteria METHICILLIN RESISTANT STAPHYLOCOCCUS AUREUS  Final      Susceptibility   Methicillin resistant staphylococcus aureus - MIC*    CIPROFLOXACIN >=8 RESISTANT Resistant     ERYTHROMYCIN <=0.25 SENSITIVE Sensitive     GENTAMICIN <=0.5 SENSITIVE Sensitive     OXACILLIN >=4 RESISTANT Resistant     TETRACYCLINE <=1 SENSITIVE Sensitive     VANCOMYCIN <=0.5 SENSITIVE Sensitive     TRIMETH/SULFA <=10 SENSITIVE Sensitive     CLINDAMYCIN <=0.25 SENSITIVE Sensitive     RIFAMPIN <=0.5 SENSITIVE Sensitive     Inducible Clindamycin NEGATIVE Sensitive     * METHICILLIN  RESISTANT STAPHYLOCOCCUS AUREUS  Blood culture (routine x 2)     Status: None   Collection Time: 06/08/15  8:15 PM  Result Value Ref Range Status   Specimen Description BLOOD LEFT HAND  Final   Special Requests BOTTLES DRAWN AEROBIC AND ANAEROBIC 5CC  Final   Culture  Setup Time   Final    GRAM POSITIVE COCCI IN CLUSTERS IN BOTH AEROBIC AND ANAEROBIC BOTTLES CRITICAL RESULT CALLED TO, READ BACK BY AND VERIFIED WITH: AROYAL,RN AT 1153 06/09/15 BY LBENFIELD    Culture   Final    METHICILLIN RESISTANT STAPHYLOCOCCUS AUREUS SUSCEPTIBILITIES PERFORMED ON PREVIOUS CULTURE WITHIN THE LAST 5 DAYS.    Report Status 06/11/2015 FINAL  Final  Culture, blood (routine x 2)     Status: None   Collection Time: 06/11/15  4:50 AM  Result Value Ref Range Status   Specimen Description BLOOD RIGHT ARM  Final   Special Requests BOTTLES DRAWN AEROBIC AND ANAEROBIC 8CC 8CC  Final   Culture NO GROWTH 5 DAYS  Final   Report Status 06/16/2015 FINAL  Final  Culture, blood (routine x 2)     Status: None   Collection Time: 06/11/15  5:08 AM  Result Value Ref Range Status   Specimen Description BLOOD RIGHT ARM  Final   Special Requests BOTTLES DRAWN AEROBIC ONLY 7CC  Final   Culture NO GROWTH 5 DAYS  Final   Report Status 06/16/2015 FINAL  Final  Urine  culture     Status: None   Collection Time: 06/11/15 11:04 AM  Result Value Ref Range Status   Specimen Description URINE, RANDOM  Final   Special Requests NONE  Final   Culture   Final    MULTIPLE SPECIES PRESENT, SUGGEST RECOLLECTION IF CLINICALLY INDICATED   Report Status 06/12/2015 FINAL  Final  MRSA PCR Screening     Status: Abnormal   Collection Time: 06/12/15  8:37 AM  Result Value Ref Range Status   MRSA by PCR POSITIVE (A) NEGATIVE Final    Comment:        The GeneXpert MRSA Assay (FDA approved for NASAL specimens only), is one component of a comprehensive MRSA colonization surveillance program. It is not intended to diagnose MRSA infection  nor to guide or monitor treatment for MRSA infections.     Anti-infectives    Start     Dose/Rate Route Frequency Ordered Stop   06/14/15 0300  vancomycin (VANCOCIN) IVPB 750 mg/150 ml premix     750 mg 150 mL/hr over 60 Minutes Intravenous Every 12 hours 06/13/15 1628        Assessment: 68 YOM who continues on Vancomycin for MRSA bacteremia. TEE did not show a vegetation. ID planning to treat thru 7/11. Vancomycin therapeutic on 06/15/15.  (VT 15 mcg/ml, goal of 15-20 mcg/ml). Afebrile.  Renal function stable - current dose remains appropriate.   Goal of Therapy:  Vancomycin trough level 15-20 mcg/ml  Plan:  1. Continue Vancomycin 750 mg IV every 12 hours 2. Noted ID plans to continue through 7/11 3. Will continue to follow renal function for any necessary dose adjustments    Jacob AbrahamMichelle T. Rockney White, Pharm.D. [phone] 06/18/2015 10:04 AM

## 2015-06-18 NOTE — Progress Notes (Signed)
Social Work Patient ID: Jacob White, male   DOB: 04/17/1946, 69 y.o.   MRN: [id number] Team feels pt will be ready for discharge on Wed. Pt aware, will discuss with wife when arrives shortly. [facility] to provide IV antibiotic until 7/11.  Pt is pleased with and MD is in agreement with plans.

## 2015-06-18 NOTE — Progress Notes (Signed)
Physical Therapy Session Note  Patient Details  Name: Jacob White MRN: [id number] Date of Birth: 11/16/1946  Today's Date: 06/18/2015 PT Individual Time: 0800-0857 PT Individual Time Calculation (min): 57 min   Short Term Goals: Week 1:  PT Short Term Goal 1 (Week 1): STGs = LTGs due to ELOS  Skilled Therapeutic Interventions/Progress Updates:   Pt received lying in bed, agreeable to therapy session.  Performed bed mobility at mod I level with HOB slightly elevated.  Ambulated to sink to wash/dry hands at S level without AD.  Then ambulated to/from therapy gym x 200' x 2 reps without AD at S to mod I level.  Note that pt getting very close to goals, therefore discussed D/C date being possibly Wednesday and the amount of S that he has at home.  Pt states wife is at home with him to provide intermittent assist.  Once in therapy gym, worked on high level balance with ankle/hip strategies on rocker board vertically and horizontally without UE support>EC>EO and tossing ball x 30 sec each.  Given cue to find target before closing eyes to assist with balance.  Min A intermittently, esp to the L when facing horizontally.  Progressed to hip strengthening to improve balance with 4 way hip exercise with green theraband all directions x 10 reps with seated rest break in between alternating LEs.  Then performed hip abd and modified SLS kicking BOSU ball to the side with alternating BLEs, and ended with series of marching and tandem walking on compliant surface x 3 reps forwards and backwards.  Tolerated well with light single UE support with no overt LOB during task.  Provided written instruction and illustration for 4 way hip exercise along with green theraband for hip strengthening at home.  Pt verbalized understanding.  Ambulated back to room and addressed balance again with head turns side/side up/down.  Note limited cervical ROM, esp laterally therefore did not perform task. Left in room in recliner with all  needs in reach.  Discussed making pt mod I in room tomorrow.  Pt verbalized understanding.   Therapy Documentation Precautions:  Precautions Precautions: Fall Restrictions Weight Bearing Restrictions: No   Vital Signs: Therapy Vitals Temp: 98.4 F (36.9 C) Temp Source: Oral Pulse Rate: 68 Resp: 18 BP: 108/65 mmHg Patient Position (if appropriate): Lying Oxygen Therapy SpO2: 95 % O2 Device: Not Delivered Pain: no c/o pain during session.    See FIM for current functional status  Therapy/Group: Individual Therapy  [name], Romond Pipkins Ann 06/18/2015, 7:57 AM

## 2015-06-19 ENCOUNTER — Inpatient Hospital Stay (HOSPITAL_COMMUNITY): Payer: Medicare Other | Admitting: Speech Pathology

## 2015-06-19 ENCOUNTER — Ambulatory Visit (HOSPITAL_COMMUNITY): Payer: Medicare Other | Admitting: Rehabilitation

## 2015-06-19 ENCOUNTER — Inpatient Hospital Stay (HOSPITAL_COMMUNITY): Payer: Medicare Other | Admitting: Occupational Therapy

## 2015-06-19 LAB — GLUCOSE, CAPILLARY
GLUCOSE-CAPILLARY: 189 mg/dL — AB (ref 65–99)
Glucose-Capillary: 170 mg/dL — ABNORMAL HIGH (ref 65–99)
Glucose-Capillary: 108 mg/dL — ABNORMAL HIGH (ref 65–99)
Glucose-Capillary: 190 mg/dL — ABNORMAL HIGH (ref 65–99)

## 2015-06-19 LAB — BASIC METABOLIC PANEL
Anion gap: 7 (ref 5–15)
BUN: 24 mg/dL — ABNORMAL HIGH (ref 6–20)
CHLORIDE: 99 mmol/L — AB (ref 101–111)
CO2: 27 mmol/L (ref 22–32)
CREATININE: 0.87 mg/dL (ref 0.61–1.24)
Calcium: 8.2 mg/dL — ABNORMAL LOW (ref 8.9–10.3)
GFR calc non Af Amer: >60 mL/min (ref >60)
Glucose, Bld: 163 mg/dL — ABNORMAL HIGH (ref 65–99)
POTASSIUM: 4.5 mmol/L (ref 3.5–5.1)
SODIUM: 133 mmol/L — AB (ref 135–145)

## 2015-06-19 NOTE — Plan of Care (Signed)
Problem: RH Wheelchair Mobility Goal: LTG Patient will propel w/c in controlled environment (PT) LTG: Patient will propel wheelchair in controlled environment, # of feet with assist (PT)  Outcome: Not Applicable Date Met:  41/42/39 Pt does not need use of w/c at time of D/c

## 2015-06-19 NOTE — Progress Notes (Signed)
Speech Language Pathology Discharge Summary  Patient Details  Name: Jacob White MRN: [id number] Date of Birth: 02-08-1946  Today's Date: 06/19/2015 SLP Individual Time: 1300-1400 SLP Individual Time Calculation (min): 60 min   Skilled Therapeutic Interventions:   Skilled treatment session focused on addressing cognition goals and wrap up of patient and family education; however, no family present for today's session again.  SLP discussed/reinforced memory compensatory strategies that SLP introduced to patient during yesterday's session.  Patient required verbal cues to utilize given external aid during discussion to assist with recall of information; patient verbalized understanding.  Patient participated in a new learning task that required him to implement utilizing external aids for recall of new information with Supervision level verbal cues.  Patient continues to demonstrate inconsistent emergent awareness of memory deficits; as a result he continues to require full supervision upon discharge (information in handout for wife).  Goals met.        Patient has met 3 of 3 long term goals.  Patient to discharge at overall Supervision;Min level.  Reasons goals not met: n/a   Clinical Impression/Discharge Summary:    Patient has made functional gains during this rehab admission and has met 3 of 3 long term goals due to improved functional abilities.  Patient is currently an overall Supervision level assist for basic cognitive tasks except for Min assist with emergent awareness.  Family education was attempted x2.  Patient education has been completed and patient will discharge home with reported 24 hour supervision. Patient reports that he is at baseline level of functioning given that he required assist with medication and financial management prior to admission.     Care Partner:  Caregiver Able to Provide Assistance: Yes  Type of Caregiver Assistance: Cognitive;Physical  Recommendation:  24  hour supervision/assistance  Rationale for SLP Follow Up:  (n/a)   Equipment: none   Reasons for discharge: Treatment goals met;Discharged from hospital   Patient/Family Agrees with Progress Made and Goals Achieved: Yes   See FIM for current functional status  Carmelia Roller., [place] 06/19/2015, 1:39 PM

## 2015-06-19 NOTE — Progress Notes (Signed)
Occupational Therapy Session Note  Patient Details  Name: Felix Pacinisam J Lewan MRN: [id number] Date of Birth: 12/23/1945  Today's Date: 06/19/2015 OT Individual Time: 1030-1055 OT Individual Time Calculation (min): 25 min    Short Term Goals: Week 1:  OT Short Term Goal 1 (Week 1): STG=LTGs secondary to estimated short LOS  Skilled Therapeutic Interventions/Progress Updates:    Pt seen for OT therapy focusing on functional activity tolerance and community mobility. Pt sitting in recliner upon arrival with caregivers present agreeable to tx- caregivers did not attend session. Pt ambulated throughout unit and hospital with overall supervision- mod I. Pt navigated through busy hospital lobby, over uneven terrain, up/down ramp,  Throughout hospital gift shop,  and  up/down stairs with supervision and no LOB episodes. Occasional VCs provided to slow down pace of walking. Pt tolerated ~25 minutes of functional ambulation without s/s of fatigue. Pt educated regarding fall risk, energy conservation, importance of taking rest breaks before becoming overly fatigued, stroke risk factors, importance of communicating medical problems to caregivers, and d/c planning.   Therapy Documentation Precautions:  Precautions Precautions: Fall Restrictions Weight Bearing Restrictions: No Pain: Pain Assessment Pain Assessment: No/denies pain Pain Score: 0-No pain  See FIM for current functional status  Therapy/Group: Individual Therapy  Lewis, Lanier Felty C 06/19/2015, 7:15 AM

## 2015-06-19 NOTE — Progress Notes (Signed)
Occupational Therapy Session Note  Patient Details  Name: Jacob White MRN: [id number] Date of Birth: 09/09/1946  Today's Date: 06/19/2015 OT Individual Time: [id number] OT Individual Time Calculation (min): 57 min    Short Term Goals: Week 1:  OT Short Term Goal 1 (Week 1): STG=LTGs secondary to estimated short LOS  Skilled Therapeutic Interventions/Progress Updates:    Completed ADL retraining at overall modified independent level.  Pt ambulated around room without AD and gathered all clothing and bathing needs prior to bathing at sit > stand level in shower.  Pt recalled education from previous sessions to complete LB dressing from sit > stand level.  Pt completed tub/shower transfers at Mod I level with use of tub transfer bench.  Engaged in therapeutic activity on [employer] with focus on balance and weight shifting without UE support, noted pt to have increased difficulty with weight shifting to Rt.  Also noted pt would come close to objects on right but miss bumping into them just barely.  Pt reports ready for d/c and no further questions.  Therapy Documentation Precautions:  Precautions Precautions: Fall Restrictions Weight Bearing Restrictions: No Pain: Pain Assessment Pain Assessment: No/denies pain Pain Score: 0-No pain  See FIM for current functional status  Therapy/Group: Individual Therapy  Rosalio LoudHOXIE, Tandi Hanko 06/19/2015, 11:57 AM

## 2015-06-19 NOTE — Progress Notes (Addendum)
69 y.o. male admitted to [facility] on 06/08/2015 after seeing his primary care physician for right shoulder pain. The patient injured his shoulder while trying to move a picnic table while sitting on his riding mower. During that visit the patient's wife reported that the patient has been very lethargic recently and has been increasingly confused over the past 3-4 weeks. 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 Neurology feels infarcts likely due to chronic small vessel disease and to rule out infectious source. ASA added for secondary stroke prevention. 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 Getting stronger. Mood picking up that he's getting closer to going home  ROS: Pt denies fever, rash/itching, headache, blurred or double vision, nausea, vomiting, abdominal pain, diarrhea, chest pain, shortness of breath, palpitations, dysuria, dizziness, neck or back pain, bleeding, anxiety, or depression   Objective: Vital Signs: Blood pressure 129/77, pulse 87, temperature 98.2 F (36.8 C), temperature source Oral, resp. rate 18, height _0  (1.676 m), weight 59.6 kg (131 lb 6.3 oz), SpO2 98 %. No results found. Results for orders placed or performed during the hospital encounter of 06/13/15 (from the past 72 hour(s))  Glucose, capillary     Status: Abnormal   Collection Time: 06/16/15 11:30 AM  Result Value Ref Range   Glucose-Capillary 208 (H) 65 - 99 mg/dL   Comment 1 Notify RN   Glucose, capillary     Status: Abnormal   Collection Time: 06/16/15  4:29 PM  Result Value Ref Range   Glucose-Capillary 186 (H) 65 - 99 mg/dL  Glucose, capillary     Status: Abnormal   Collection Time: 06/16/15  9:50 PM  Result Value Ref Range   Glucose-Capillary 170 (H) 65 - 99 mg/dL  Glucose, capillary     Status: Abnormal   Collection  Time: 06/17/15  7:09 AM  Result Value Ref Range   Glucose-Capillary 188 (H) 65 - 99 mg/dL  Glucose, capillary     Status: Abnormal   Collection Time: 06/17/15 11:27 AM  Result Value Ref Range   Glucose-Capillary 212 (H) 65 - 99 mg/dL  Glucose, capillary     Status: Abnormal   Collection Time: 06/17/15  5:04 PM  Result Value Ref Range   Glucose-Capillary 194 (H) 65 - 99 mg/dL  Glucose, capillary     Status: Abnormal   Collection Time: 06/17/15  8:47 PM  Result Value Ref Range   Glucose-Capillary 204 (H) 65 - 99 mg/dL  Glucose, capillary     Status: Abnormal   Collection Time: 06/18/15  6:47 AM  Result Value Ref Range   Glucose-Capillary 198 (H) 65 - 99 mg/dL  Glucose, capillary     Status: Abnormal   Collection Time: 06/18/15 11:22 AM  Result Value Ref Range   Glucose-Capillary 244 (H) 65 - 99 mg/dL  Glucose, capillary     Status: Abnormal   Collection Time: 06/18/15  4:38 PM  Result Value Ref Range   Glucose-Capillary 231 (H) 65 - 99 mg/dL  Glucose, capillary     Status: Abnormal   Collection Time: 06/18/15  9:50 PM  Result Value Ref Range   Glucose-Capillary 127 (H) 65 - 99 mg/dL   Comment 1 Notify RN   Basic metabolic panel     Status: Abnormal   Collection Time: 06/19/15  5:16 AM  Result Value Ref Range  Sodium 133 (L) 135 - 145 mmol/L   Potassium 4.5 3.5 - 5.1 mmol/L   Chloride 99 (L) 101 - 111 mmol/L   CO2 27 22 - 32 mmol/L   Glucose, Bld 163 (H) 65 - 99 mg/dL   BUN 24 (H) 6 - 20 mg/dL   Creatinine, Ser 0.87 0.61 - 1.24 mg/dL   Calcium 8.2 (L) 8.9 - 10.3 mg/dL   GFR calc non Af Amer >60 >60 mL/min   GFR calc Af Amer >60 >60 mL/min    Comment: (NOTE) The eGFR has been calculated using the CKD EPI equation. This calculation has not been validated in all clinical situations. eGFR's persistently <60 mL/min signify possible Chronic Kidney Disease.    Anion gap 7 5 - 15  Glucose, capillary     Status: Abnormal   Collection Time: 06/19/15  6:59 AM  Result Value  Ref Range   Glucose-Capillary 189 (H) 65 - 99 mg/dL   Comment 1 Notify RN      HEENT: normal Cardio: RRR and no murmurs Resp: CTA B/L and unlabored. No wheezes or rales GI: BS positive and nontender nondistended Extremity:  Pulses positive and No Edema Skin:   Intact Neuro: Alert/Oriented, Abnormal Sensory reduced sensation in bilateral hands and feet, atrophy of the hand and foot intrinsic muscles, Abnormal Motor 4+/5 in the left deltoid, biceps, triceps, grip, hip flexor, knee extensors, ankle dorsiflexion 5/5 on the right side with exception of right grip  which is  4+/5 and Abnormal FMC Ataxic/ dec FMC Musc/Skel:  Other nno pain with right shoulder range of motion,, hand and foot intrinsic muscle atrophy is noted Gen. no acute distress   Assessment/Plan: 1. Functional deficits secondary to right fontal-parietal infarct with left hemiparesis, in a patient with severe diabetic neuropathy complicated by MRSA bacteremia  which require 3+ hours per day of interdisciplinary therapy in a comprehensive inpatient rehab setting. Physiatrist is providing close team supervision and 24 hour management of active medical problems listed below. Physiatrist and rehab team continue to assess barriers to discharge/monitor patient progress toward functional and medical goals. FIM: FIM - Bathing Bathing Steps Patient Completed: Chest, Right Arm, Left Arm, Abdomen, Front perineal area, Buttocks, Right upper leg, Left upper leg, Right lower leg (including foot), Left lower leg (including foot) Bathing: 5: Supervision: Safety issues/verbal cues  FIM - Upper Body Dressing/Undressing Upper body dressing/undressing steps patient completed: Thread/unthread right sleeve of pullover shirt/dresss, Put head through opening of pull over shirt/dress, Thread/unthread left sleeve of pullover shirt/dress, Pull shirt over trunk Upper body dressing/undressing: 7: Complete Independence: No helper FIM - Lower Body  Dressing/Undressing Lower body dressing/undressing steps patient completed: Thread/unthread right underwear leg, Thread/unthread left underwear leg, Pull underwear up/down, Thread/unthread right pants leg, Thread/unthread left pants leg, Pull pants up/down, Fasten/unfasten pants, Don/Doff right sock, Don/Doff left sock Lower body dressing/undressing: 4: Steadying Assist  FIM - Toileting Toileting steps completed by patient: Adjust clothing prior to toileting, Performs perineal hygiene, Adjust clothing after toileting Toileting: 6: More than reasonable amount of time  FIM - Radio producer Devices: Grab bars Toilet Transfers: 5-To toilet/BSC: Supervision (verbal cues/safety issues), 5-From toilet/BSC: Supervision (verbal cues/safety issues)  FIM - Control and instrumentation engineer Devices: Bed rails Bed/Chair Transfer: 5: Supine > Sit: Supervision (verbal cues/safety issues)  FIM - Locomotion: Wheelchair Distance: 200 Locomotion: Wheelchair: 0: Activity did not occur FIM - Locomotion: Ambulation Locomotion: Ambulation Assistive Devices: Other (comment) (no device) Ambulation/Gait Assistance: 5: Supervision Locomotion: Ambulation: 5:  Travels 150 ft or more with supervision/safety issues  Comprehension Comprehension Mode: Auditory Comprehension: 5-Follows basic conversation/direction: With no assist  Expression Expression Mode: Verbal Expression: 5-Expresses basic 90% of the time/requires cueing < 10% of the time.  Social Interaction Social Interaction: 6-Interacts appropriately with others with medication or extra time (anti-anxiety, antidepressant).  Problem Solving Problem Solving: 4-Solves basic 75 - 89% of the time/requires cueing 10 - 24% of the time  Memory Memory: 4-Recognizes or recalls 75 - 89% of the time/requires cueing 10 - 24% of the time  Medical Problem List and Plan: 1. Functional deficits secondary to right  fontal-parietal infarct complicated by MRSA bacteremia (?source) 2.  DVT Prophylaxis/Anticoagulation: Pharmaceutical: Lovenox--continue until dc 3. Pain Management: Denies any back or shoulder pain. Tylenol prn effective 4. Mood: LCSW to follow for evaluation and support.   5. Neuropsych: This patient is not capable of making decisions on his own behalf. 6. Skin/Wound Care: Routine pressure relief measures.  nutrition 7. Fluids/Electrolytes/Nutrition: need to push fluids  -I personally reviewed the patient's labs today.   -recheck bmet tomorrow 8. MRSA Bacteremia: Continue Vancomycin through July 11 th with trough goal 15-20 as long as blood cultures remain negative.      10 Hypokalemia: Continue supplement for now. Check labs in am Tuesday.  .   11. DM type 2 with polyneuropathy: remains uncontrolled :   lantus---increase to 12 u qam potentially at discharge  - Increased metformin to 1067m bid--observe for results today before adjusting insulin 12. HTN: bp under control. Continue prinivil. holding HCTZ due to continued drop in Na+   LOS (Days) 6 A FACE TO FACE EVALUATION WAS PERFORMED  Athenia Rys T 06/19/2015, 9:24 AM

## 2015-06-19 NOTE — Patient Care Conference (Signed)
Inpatient RehabilitationTeam Conference and Plan of Care Update Date: 06/20/2015   Time: 10:35 AM    Patient Name: Jacob White      Medical Record Number: [id number]  Date of Birth: October 17, 1946 Sex: Male         Room/Bed: 4W25C/4W25C-01 Payor Info: Payor: MEDICARE / Plan: MEDICARE PART A AND B / Product Type: *No Product type* /    Admitting Diagnosis: R frontal parietal infarct  Admit Date/Time:  06/13/2015  4:17 PM Admission Comments: No comment available   Primary Diagnosis:  Stroke, acute, thrombotic, within last 8 weeks Principal Problem: Stroke, acute, thrombotic, within last 8 weeks  Patient Active Problem List   Diagnosis Date Noted  . Stroke, acute, thrombotic, within last 8 weeks 06/13/2015  . Left hemiparesis   . Poorly controlled type 2 diabetes mellitus   . Cerebral thrombosis with cerebral infarction 06/11/2015  . MRSA bacteremia 06/11/2015  . Bacteremia 06/09/2015  . Weakness generalized 06/08/2015  . Confusion 06/08/2015  . Dehydration 06/08/2015  . Diabetes mellitus without complication 35/59/7416  . Pain of right upper extremity 06/08/2015    Expected Discharge Date: Expected Discharge Date: 06/20/15  Team Members Present: Physician leading conference: Dr. Alger Simons Social Worker Present: Ovidio Kin, LCSW Nurse Present: Heather Roberts, RN PT Present: Cameron Sprang, PT OT Present: Simonne Come, OT SLP Present: Windell Moulding, SLP PPS Coordinator present : Daiva Nakayama, RN, CRRN     Current Status/Progress Goal Weekly Team Focus  Medical   ready for dc. goals met  prepare for dc  dc planning   Bowel/Bladder     cont B & B   cont B & B     Swallow/Nutrition/ Hydration     na        ADL's   Mod I overall  Mod I overall  DME for bathroom transfers, pt and family education   Mobility   mod I overall   mod I overall   D/C planning   Communication     na        Safety/Cognition/ Behavioral Observations  Supervision-Min assist  no unsafe behaviors  Supervision-Min assist   education complete ready for discharge   Pain     less than 3   less than 3     Skin     no skin issues-monitor IV site           *See Care Plan and progress notes for long and short-term goals.  Barriers to Discharge: none    Possible Resolutions to Barriers:  n/a    Discharge Planning/Teaching Needs:    Home with wife and children to assist-has done very well here and will need home IV antibiotics until 7/11.     Team Discussion:  Pt reached goals of mod/i-supervision level-family education completed with wife and son. Will provide 24 hr care  Revisions to Treatment Plan:  Dc today   Continued Need for Acute Rehabilitation Level of Care: The patient requires daily medical management by a physician with specialized training in physical medicine and rehabilitation for the following conditions: Daily direction of a multidisciplinary physical rehabilitation program to ensure safe treatment while eliciting the highest outcome that is of practical value to the patient.: Yes Daily medical management of patient stability for increased activity during participation in an intensive rehabilitation regime.: Yes Daily analysis of laboratory values and/or radiology reports with any subsequent need for medication adjustment of medical intervention for : Post surgical problems;Neurological problems;Pulmonary problems  Waseem Suess,  Gardiner Rhyme 06/20/2015, 10:35 AM

## 2015-06-19 NOTE — Progress Notes (Signed)
Social Work Patient ID: Jacob White, male   DOB: 02/08/1946, 69 y.o.   MRN: [id number] [facility] to come at 11;00 to educate on IV antibiotics at home. Discussed equipment need of tub bench and the private cost of this. Son, pt and wife felt it was needed and agreed [facility] Working toward discharge tomorrow. All feel pt has done well and is ready to go home.

## 2015-06-19 NOTE — Progress Notes (Signed)
Physical Therapy Discharge Summary  Patient Details  Name: Jacob White MRN: [id number] Date of Birth: [birth year]  Today's Date: 06/19/2015 PT Individual Time: 5277-8242 PT Individual Time Calculation (min): 55 min    Patient has met 10 of 11 long term goals due to improved activity tolerance, improved balance and improved coordination.  Patient to discharge at an ambulatory level Modified Independent.   Patient's care partner available to provide intermittent S as needed.    Reasons goals not met: Did not assess w/c goal as pt is ambulatory both at home and in community, therefore did not meet goal.   Recommendation:  Patient will benefit from ongoing skilled PT services in outpatient setting (starting with HHPT) to continue to advance safe functional mobility, address ongoing impairments in decreased balance, and minimize fall risk.  Equipment: No equipment provided  Reasons for discharge: treatment goals met and discharge from hospital  Patient/family agrees with progress made and goals achieved: Yes   PT Treatment/Intervention:  Skilled session focused on grad day activities and ensuring pt has met all goals for safe d/c home.  Performed all gait in controlled, home and community (including outdoors in grass and up/down inclines) at mod I level without need for AD.  Performed all functional transfers at mod I level.  Requires max verbal cues to recall floor recovery scenarios for when should call 911 vs attempt transfer.  Performed BERG balance test with new score of 50/56, indicative of moderate fall risk.  Education on results.  Performed TUG x 3 with avg listed below, demonstrating decreased fall risk as well. Went over HEP x 10 reps on R LE only.  Pt verbalized understanding.  Performed 12, 6" steps with single handrail at mod I level as well.  Education on remaining active at home and going with wife into community as much as possible.  Pt verbalized understanding.  Left pt in room  and made mod I.  RN aware.   PT Discharge Precautions/Restrictions Precautions Precautions: Fall Restrictions Weight Bearing Restrictions: No Vital Signs Therapy Vitals Temp: 98.7 F (37.1 C) Temp Source: Oral Pulse Rate: 78 Resp: 18 BP: 111/61 mmHg Patient Position (if appropriate): Lying Oxygen Therapy SpO2: 96 % O2 Device: Not Delivered Pain Pain Assessment Pain Assessment: No/denies pain    Cognition Overall Cognitive Status: Within Functional Limits for tasks assessed Arousal/Alertness: Awake/alert Orientation Level: Oriented X4 Attention: Alternating Focused Attention: Appears intact Sustained Attention: Appears intact Selective Attention: Appears intact Alternating Attention: Appears intact Memory: Impaired Memory Impairment: Decreased recall of new information Awareness: Appears intact Problem Solving: Impaired Problem Solving Impairment: Functional complex Safety/Judgment: Appears intact Sensation Sensation Light Touch: Appears Intact Stereognosis: Not tested Hot/Cold: Not tested Proprioception: Appears Intact Coordination Gross Motor Movements are Fluid and Coordinated: Yes Fine Motor Movements are Fluid and Coordinated: Yes Finger Nose Finger Test: [place] decreased for speed and dexterity but pt able to functionally tie shoes and fasten pants Heel Shin Test: wfl B LEs  Motor  Motor Motor: Other (comment) Motor - Discharge Observations: slightly decreased balance  Mobility Bed Mobility Bed Mobility: Supine to Sit;Sit to Supine Supine to Sit: 6: Modified independent (Device/Increase time) Sit to Supine: 6: Modified independent (Device/Increase time) Transfers Transfers: Yes Sit to Stand: 6: Modified independent (Device/Increase time) Stand to Sit: 6: Modified independent (Device/Increase time) Stand Pivot Transfers: 6: Modified independent (Device/Increase time) Locomotion  Ambulation Ambulation: Yes Ambulation/Gait Assistance: 6: Modified  independent (Device/Increase time) Ambulation Distance (Feet): 300 Feet Assistive device: None Gait Gait: No Gait  Pattern: Impaired Gait Pattern: Left flexed knee in stance;Decreased stride length Gait velocity: WFL Stairs / Additional Locomotion Stairs: Yes Stairs Assistance: 6: Modified independent (Device/Increase time) Stair Management Technique: One rail Right Number of Stairs: 12 Height of Stairs: 6 Wheelchair Mobility Wheelchair Mobility: No (pt ambulatory)  Trunk/Postural Assessment  Cervical Assessment Cervical Assessment: Exceptions to [facility] Cervical AROM Overall Cervical AROM: Deficits;Due to premorid status Overall Cervical AROM Comments: extremely limited L cervical side flexion Thoracic Assessment Thoracic Assessment: Within Functional Limits Lumbar Assessment Lumbar Assessment: Exceptions to [facility] Lumbar AROM Overall Lumbar AROM: Deficits;Due to premorid status Overall Lumbar AROM Comments: limited L trunk side flexion Postural Control Postural Control: Deficits on evaluation Postural Limitations: premorbid forward head  Balance Balance Balance Assessed: Yes Standardized Balance Assessment Standardized Balance Assessment: Berg Balance Test Berg Balance Test Sit to Stand: Able to stand without using hands and stabilize independently Standing Unsupported: Able to stand safely 2 minutes Sitting with Back Unsupported but Feet Supported on Floor or Stool: Able to sit safely and securely 2 minutes Stand to Sit: Sits safely with minimal use of hands Transfers: Able to transfer safely, minor use of hands Standing Unsupported with Eyes Closed: Able to stand 10 seconds safely Standing Ubsupported with Feet Together: Able to place feet together independently and stand 1 minute safely From Standing, Reach Forward with Outstretched Arm: Can reach confidently >25 cm (10") From Standing Position, Pick up Object from Floor: Able to pick up shoe safely and easily From  Standing Position, Turn to Look Behind Over each Shoulder: Looks behind from both sides and weight shifts well Turn 360 Degrees: Able to turn 360 degrees safely but slowly Standing Unsupported, Alternately Place Feet on Step/Stool: Able to stand independently and safely and complete 8 steps in 20 seconds Standing Unsupported, One Foot in Front: Able to plae foot ahead of the other independently and hold 30 seconds Standing on One Leg: Tries to lift leg/unable to hold 3 seconds but remains standing independently Total Score: 50 Timed Up and Go Test TUG: Normal TUG Normal TUG (seconds): 9.72 Static Sitting Balance Static Sitting - Balance Support: Feet supported;Left upper extremity supported;Right upper extremity supported Static Sitting - Level of Assistance: 6: Modified independent (Device/Increase time) Dynamic Sitting Balance Dynamic Sitting - Balance Support: Feet supported Dynamic Sitting - Level of Assistance: 6: Modified independent (Device/Increase time) Static Standing Balance Static Standing - Level of Assistance: 6: Modified independent (Device/Increase time) Dynamic Standing Balance Dynamic Standing - Level of Assistance: 6: Modified independent (Device/Increase time) Extremity Assessment  RUE Assessment RUE Assessment: Within Functional Limits LUE Assessment LUE Assessment: Exceptions to WFL LUE AROM (degrees) Overall AROM Left Upper Extremity: Within functional limits for tasks assessed LUE Strength LUE Overall Strength: Deficits LUE Overall Strength Comments: 4+/5 grossly RLE Assessment RLE Assessment: Within Functional Limits LLE Assessment LLE Assessment: Within Functional Limits  See FIM for current functional status  Denice Bors 06/19/2015, 5:12 PM

## 2015-06-19 NOTE — Progress Notes (Signed)
Occupational Therapy Discharge Summary  Patient Details  Name: Jacob White MRN: [id number] Date of Birth: Jan 02, 1946  Patient has met 9 of 9 long term goals due to improved activity tolerance, improved balance, ability to compensate for deficits and improved coordination.  Patient to discharge at overall Modified Independent level.  Patient's care partner is independent to provide the necessary assistance at discharge.    Reasons goals not met: N/A  Recommendation:  Patient will benefit from ongoing skilled OT services in outpatient setting to continue to advance functional skills in the area of BADL and Reduce care partner burden.  Equipment: tub transfer bench  Reasons for discharge: treatment goals met and discharge from hospital  Patient/family agrees with progress made and goals achieved: Yes  OT Discharge Precautions/Restrictions  Precautions Precautions: Fall Restrictions Weight Bearing Restrictions: No General   Vital Signs Therapy Vitals Temp: 98.7 F (37.1 C) Temp Source: Oral Pulse Rate: 78 Resp: 18 BP: 111/61 mmHg Patient Position (if appropriate): Lying Oxygen Therapy SpO2: 96 % O2 Device: Not Delivered Pain Pain Assessment Pain Assessment: No/denies pain ADL  See FIM Vision/Perception  Vision- History Baseline Vision/History: Wears glasses Wears Glasses: Reading only Vision- Assessment Vision Assessment?: Vision impaired- to be further tested in functional context  Cognition Overall Cognitive Status: Within Functional Limits for tasks assessed Arousal/Alertness: Awake/alert Orientation Level: Oriented X4 Attention: Alternating Focused Attention: Appears intact Sustained Attention: Appears intact Selective Attention: Appears intact Alternating Attention: Appears intact Memory: Impaired Memory Impairment: Decreased recall of new information Awareness: Appears intact Problem Solving: Impaired Problem Solving Impairment: Functional  complex Safety/Judgment: Appears intact Sensation Sensation Light Touch: Appears Intact Stereognosis: Not tested Hot/Cold: Not tested Proprioception: Appears Intact Coordination Gross Motor Movements are Fluid and Coordinated: Yes Fine Motor Movements are Fluid and Coordinated: Yes Finger Nose Finger Test: [facility] decreased for speed and dexterity but pt able to functionally tie shoes and fasten pants Heel Shin Test: wfl B LEs  Motor  Motor Motor: Other (comment) Motor - Discharge Observations: slightly decreased balance Mobility  Bed Mobility Bed Mobility: Supine to Sit;Sit to Supine Supine to Sit: 6: Modified independent (Device/Increase time) Sit to Supine: 6: Modified independent (Device/Increase time) Transfers Sit to Stand: 6: Modified independent (Device/Increase time) Stand to Sit: 6: Modified independent (Device/Increase time)  Trunk/Postural Assessment  Cervical Assessment Cervical Assessment: Exceptions to [facility] Cervical AROM Overall Cervical AROM: Deficits;Due to premorid status Overall Cervical AROM Comments: extremely limited L cervical side flexion Thoracic Assessment Thoracic Assessment: Within Functional Limits Lumbar Assessment Lumbar Assessment: Exceptions to [facility] Lumbar AROM Overall Lumbar AROM: Deficits;Due to premorid status Overall Lumbar AROM Comments: limited L trunk side flexion Postural Control Postural Control: Deficits on evaluation Postural Limitations: premorbid forward head  Balance Balance Balance Assessed: Yes Standardized Balance Assessment Standardized Balance Assessment: Berg Balance Test Berg Balance Test Sit to Stand: Able to stand without using hands and stabilize independently Standing Unsupported: Able to stand safely 2 minutes Sitting with Back Unsupported but Feet Supported on Floor or Stool: Able to sit safely and securely 2 minutes Stand to Sit: Sits safely with minimal use of hands Transfers: Able to transfer safely, minor use  of hands Standing Unsupported with Eyes Closed: Able to stand 10 seconds safely Standing Ubsupported with Feet Together: Able to place feet together independently and stand 1 minute safely From Standing, Reach Forward with Outstretched Arm: Can reach confidently >25 cm (10") From Standing Position, Pick up Object from Floor: Able to pick up shoe safely and easily From Standing Position,  Turn to Look Behind Over each Shoulder: Looks behind from both sides and weight shifts well Turn 360 Degrees: Able to turn 360 degrees safely but slowly Standing Unsupported, Alternately Place Feet on Step/Stool: Able to stand independently and safely and complete 8 steps in 20 seconds Standing Unsupported, One Foot in Front: Able to plae foot ahead of the other independently and hold 30 seconds Standing on One Leg: Tries to lift leg/unable to hold 3 seconds but remains standing independently Total Score: 50 Timed Up and Go Test TUG: Normal TUG Normal TUG (seconds): 9.72 Extremity/Trunk Assessment RUE Assessment RUE Assessment: Within Functional Limits LUE Assessment LUE Assessment: Exceptions to [facility] LUE AROM (degrees) Overall AROM Left Upper Extremity: Within functional limits for tasks assessed LUE Strength LUE Overall Strength: Deficits LUE Overall Strength Comments: 4+/5 grossly  See FIM for current functional status  Alyzah Pelly, [facility] 06/19/2015, 3:33 PM

## 2015-06-19 NOTE — Progress Notes (Signed)
Recreational Therapy Session Note  Patient Details  Name: Jacob White MRN: [id number] Date of Birth: 03/20/1946 Today's Date: 06/19/2015   Order received & chart reviewed.  TR eval deferred as pt with anticipated discharge date scheduled for tomorrow.   Omar Orrego 06/19/2015, 4:28 PM

## 2015-06-20 DIAGNOSIS — R7989 Other specified abnormal findings of blood chemistry: Secondary | ICD-10-CM

## 2015-06-20 LAB — GLUCOSE, CAPILLARY
GLUCOSE-CAPILLARY: 139 mg/dL — AB (ref 65–99)
Glucose-Capillary: 130 mg/dL — ABNORMAL HIGH (ref 65–99)

## 2015-06-20 LAB — BASIC METABOLIC PANEL
Anion gap: 7 (ref 5–15)
BUN: 24 mg/dL — AB (ref 6–20)
CALCIUM: 8.5 mg/dL — AB (ref 8.9–10.3)
CO2: 26 mmol/L (ref 22–32)
Chloride: 101 mmol/L (ref 101–111)
Creatinine, Ser: 0.76 mg/dL (ref 0.61–1.24)
GFR calc Af Amer: >60 mL/min (ref >60)
GFR calc non Af Amer: >60 mL/min (ref >60)
GLUCOSE: 109 mg/dL — AB (ref 65–99)
Potassium: 4.1 mmol/L (ref 3.5–5.1)
Sodium: 134 mmol/L — ABNORMAL LOW (ref 135–145)

## 2015-06-20 MED ORDER — INSULIN GLARGINE 100 UNIT/ML SOLOSTAR PEN: 12.0000 [IU] | PEN_INJECTOR | Freq: Every day | SUBCUTANEOUS | Status: AC

## 2015-06-20 MED ORDER — INSULIN PEN NEEDLE 32G X 5 MM MISC: 1.0000 | Freq: Every day | Status: AC

## 2015-06-20 MED ORDER — ASPIRIN 325 MG PO TBEC: 325.0000 mg | DELAYED_RELEASE_TABLET | Freq: Every day | ORAL | Status: AC

## 2015-06-20 MED ORDER — INSULIN GLARGINE 100 UNIT/ML ~~LOC~~ SOLN
12.0000 [IU] | Freq: Every day | SUBCUTANEOUS | Status: DC
  Filled 2015-06-20: qty 0.12

## 2015-06-20 MED ORDER — INSULIN GLARGINE 100 UNIT/ML ~~LOC~~ SOLN: 12.0000 [IU] | Freq: Every day | SUBCUTANEOUS | Status: DC

## 2015-06-20 MED ORDER — LISINOPRIL 20 MG PO TABS: 20.0000 mg | ORAL_TABLET | Freq: Every day | ORAL | Status: AC

## 2015-06-20 MED ORDER — VANCOMYCIN HCL IN DEXTROSE 750-5 MG/150ML-% IV SOLN: 750.0000 mg | Freq: Two times a day (BID) | INTRAVENOUS | Status: AC

## 2015-06-20 MED ORDER — INSULIN STARTER KIT- PEN NEEDLES (ENGLISH)
1.0000 | Freq: Once | Status: DC
  Filled 2015-06-20: qty 1

## 2015-06-20 MED ORDER — BLOOD GLUCOSE MONITOR KIT: PACK | Status: AC

## 2015-06-20 NOTE — Discharge Summary (Addendum)
Physician Discharge Summary  Patient ID: Jacob White MRN: [id number] DOB/AGE: May 05, 1946 69 y.o.  Admit date: 06/13/2015 Discharge date: 06/20/2015  Discharge Diagnoses:  Principal Problem:   Stroke, acute, thrombotic, within last 8 weeks Active Problems:   MRSA bacteremia   Left hemiparesis   Poorly controlled type 2 diabetes mellitus   Discharged Condition: Stable  Significant Diagnostic Studies:   Labs:  Basic Metabolic Panel:  Recent Labs Lab 06/14/15 0515 06/15/15 0458 06/19/15 0516 06/20/15 0540  NA 130* 133* 133* 134*  K 3.4* 4.0 4.5 4.1  CL 95* 98* 99* 101  CO2 27 28 27 26   GLUCOSE 160* 207* 163* 109*  BUN 20 19 24* 24*  CREATININE 0.94 0.95 0.87 0.76  CALCIUM 7.8* 8.1* 8.2* 8.5*    CBC:  Recent Labs Lab 06/14/15 0515  WBC 8.6  NEUTROABS 6.9  HGB 12.2*  HCT 35.1*  MCV 84.4  PLT 200    CBG:  Recent Labs Lab 06/19/15 1121 06/19/15 1648 06/19/15 2104 06/20/15 0654 06/20/15 1129  GLUCAP 170* 108* 190* 130* 139*    Brief HPI:   Jacob White is a 69 y.o. male admitted to [facility] on 06/08/2015 via MD office with  right shoulder pain and 3-4 weeks of mental status changes with lethargy.  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 Neurology feels infarcts likely due to chronic small vessel disease and ASA added for secondary stroke prevention. Hospital course complicated by MRSA bacteremia and Dr. Linus Salmons consulted recommended full work up to evaluate source of bacteremia. MRI of the lumbar spine showed degenerative changes and . MRI right shoulder showed rotator cuff degeneration and was negative for septic arthritis. 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 BLE dopplers were negative for DVT. ID recommends 2 week course of IV vancomycin from 06/28.  Pt had regressed from a functional standpoint since admission and  CIR was  recommended for follow up therapy.    Hospital Course: Jacob White was admitted to rehab 06/13/2015 for inpatient therapies to consist of PT, ST and OT at least three hours five days a week. Past admission physiatrist, therapy team and rehab RN have worked together to provide customized collaborative inpatient rehab. Mood has been stable and he has shown good participation during his rehab stay. Blood pressures were monitored on bid basis and have been well controlled on Prinivil alone. Diabetes was monitored with ac hs check and blood sugars were noted to be poorly controlled. Lantus insulin was titrated to 12 units and metformin was titrated up to home dose. Family has been educated on diet as well as insulin use and are to follow up with PMD for further adjustment as indicated.  Labs have been monitored serially and hyponatremia is resolving. Mild renal insufficiency noted and labs are to be rechecked by [facility] prior to d/c of antibiotics. He is tolerating Vancomycin without difficulty and is to continue on this thorough 07/11. He has made good progress during his rehab stay and requires intermittent supervision past discharge. He will continue to receive follow up HHPT,HHOT and [place] [facility] [place] past discharge.    Rehab course: During patient's stay in rehab team conference was held to discuss patient's progress as well as and discuss barriers to discharge. At admission, patient required min assist with mobility and basic self care tasks. Cognitive evaluation revealed  memory deficits with delayed processing affecting functional problem solving. He has had improvement in activity tolerance, balance, postural control, as well as ability to compensate for deficits. He is able to complete ADL tasks independently. He is independent for ambulating in household and community setting without AD. He is able to climb one flight of stairs with one rail independently. He requires  supervision for basic cognitive tasks and min assist for emergent awareness of deficits. Family has been educated on providing intermittent supervision as well as assist with medication/financial management past discharge.     Disposition: 01-Home or Self Care  Diet: Diabetic/Heart Healthy.      Medication List    STOP taking these medications        lisinopril-hydrochlorothiazide 20-25 MG per tablet  Commonly known as:  PRINZIDE,ZESTORETIC     meloxicam 15 MG tablet  Commonly known as:  MOBIC     oxyCODONE-acetaminophen 5-325 MG per tablet  Commonly known as:  PERCOCET/ROXICET      TAKE these medications        aspirin 325 MG EC tablet  Take 1 tablet (325 mg total) by mouth daily.     blood glucose meter kit and supplies Kit  Dispense based on patient and insurance preference. Use up to four times daily as directed. (FOR ICD-9 250.00, 250.01).     Insulin Glargine 100 UNIT/ML Solostar Pen  Commonly known as:  LANTUS SOLOSTAR  Inject 12 Units into the skin daily with breakfast.     Insulin Pen Needle 32G X 5 MM Misc  Commonly known as:  CAREFINE PEN NEEDLES  1 applicator by Does not apply route daily with breakfast.     lisinopril 20 MG tablet  Commonly known as:  PRINIVIL,ZESTRIL  Take 1 tablet (20 mg total) by mouth daily after supper.     metFORMIN 1000 MG tablet  Commonly known as:  GLUCOPHAGE  Take 1,000 mg by mouth 2 (two) times daily with a meal.     OMEGA 3 PO  Take 1 tablet by mouth daily.     pravastatin 40 MG tablet  Commonly known as:  PRAVACHOL  Take 40 mg by mouth daily.     Vancomycin 750 MG/150ML Soln  Commonly known as:  VANCOCIN  Inject 150 mLs (750 mg total) into the vein every 12 (twelve) hours.           Follow-up Information    Follow up with Charlett Blake, MD On 07/31/2015.   Specialty:  Physical Medicine and Rehabilitation   Why:  Be there at 11 am  for 11:30 am   appointment   Contact information:   [place] [address] [phone]       Follow up with Antony Contras, MD. Call today.   Specialties:  Neurology, Radiology   Why:  for follow up appointment in 4 weeks   Contact information:   [address] [place] [address] [phone]       Follow up with Sherren Mocha, MD. Call today.   Specialty:  Cardiology   Why:  for follow up appointment on ASD.    Contact information:   [address] [address] [phone]       Follow up with Suzan Garibaldi, FNP. Call today.   Specialty:  Nurse Practitioner   Why:  for post hopital follow up.   Contact information:   [place] [address] [phone]  Follow up with Alcide Evener, MD On 07/05/2015.   Specialty:  Infectious Diseases   Why:  Be there at 3:45 pm   Contact information:   [address] [place] [address] [phone]       Signed: Bary Leriche 06/20/2015, 4:36 PM

## 2015-06-20 NOTE — Progress Notes (Signed)
Social Work Discharge Note Discharge Note  The overall goal for the admission was met for:   Discharge location: [place] TO ASSIST-24 HR  Length of Stay: Yes-7 DAYS  Discharge activity level: Yes-MOD/I-SUPERVISION LEVEL  Home/community participation: Yes  Services provided included: MD, RD, PT, OT, SLP, RN, CM, TR, Pharmacy and SW  Financial Services: Medicare  Follow-up services arranged: Home Health: [facility]-PT,OT,RN, DME: [facility]-T[facility] and Patient/Family has no preference for HH/DME agencies  Comments (or additional information):WIFE AND SON WERE HERE YESTERDAY TO [place].  Patient/Family verbalized understanding of follow-up arrangements: Yes  Individual responsible for coordination of the follow-up plan: LINDA-WIFE & RICKY-SON  Confirmed correct DME delivered: Elease Hashimoto 06/20/2015    Elease Hashimoto

## 2015-06-20 NOTE — Discharge Instructions (Signed)
Inpatient Rehab Discharge Instructions  Jacob White Discharge date and time:    Activities/Precautions/ Functional Status: Activity: activity as tolerated Diet: cardiac diet Wound Care: none needed   Functional status:  ___ No restrictions     ___ Walk up steps independently ___ 24/7 supervision/assistance   ___ Walk up steps with assistance _X__ Intermittent supervision/assistance  ___ Bathe/dress independently ___ Walk with walker     ___ Bathe/dress with assistance ___ Walk Independently    ___ Shower independently ___ Walk with assistance    ___ Shower with assistance _X__ No alcohol     ___ Return to work/school ________  Special Instructions: 1. Follow up with NP [place] for work up of weight loss.    COMMUNITY REFERRALS UPON DISCHARGE:    Home Health:   PT, OT,RN  Agency:[facility] Phone:[phone]   Date of last service:06/20/2015  Medical Equipment/Items Ordered:TUB BENCH  Agency/Supplier:[facility]    [phone]   GENERAL COMMUNITY RESOURCES FOR PATIENT/FAMILY: Support Groups:[facility] SUPPORT GROUP  STROKE/TIA DISCHARGE INSTRUCTIONS SMOKING Cigarette smoking nearly doubles your risk of having a stroke & is the single most alterable risk factor  If you smoke or have smoked in the last 12 months, you are advised to quit smoking for your health.  Most of the excess cardiovascular risk related to smoking disappears within a year of stopping.  Ask you doctor about anti-smoking medications  [place] Quit Line: 1-800-QUIT NOW  Free Smoking Cessation Classes [phone]  CHOLESTEROL Know your levels; limit fat & cholesterol in your diet  Lipid Panel     Component Value Date/Time   CHOL 79 06/11/2015 0629   TRIG 156* 06/11/2015 0629   HDL 6* 06/11/2015 0629   CHOLHDL 13.2 06/11/2015 0629   VLDL 31 06/11/2015 0629   LDLCALC 42 06/11/2015 0629      Many patients benefit from treatment even if their cholesterol is at goal.  Goal: Total Cholesterol  (CHOL) less than 160  Goal:  Triglycerides (TRIG) less than 150  Goal:  HDL greater than 40  Goal:  LDL (LDLCALC) less than 100   BLOOD PRESSURE American Stroke Association blood pressure target is less that 120/80 mm/Hg  Your discharge blood pressure is:  BP: 111/82 mmHg  Monitor your blood pressure  Limit your salt and alcohol intake  Many individuals will require more than one medication for high blood pressure  DIABETES (A1c is a blood sugar average for last 3 months) Goal HGBA1c is under 7% (HBGA1c is blood sugar average for last 3 months)  Diabetes:     Lab Results  Component Value Date   HGBA1C 8.9* 06/11/2015     Your HGBA1c can be lowered with medications, healthy diet, and exercise.  Check your blood sugar as directed by your physician  Call your physician if you experience unexplained or low blood sugars.  PHYSICAL ACTIVITY/REHABILITATION Goal is 30 minutes at least 4 days per week  Activity: No driving, Therapies: See above Return to work: When cleared by MD  Activity decreases your risk of heart attack and stroke and makes your heart stronger.  It helps control your weight and blood pressure; helps you relax and can improve your mood.  Participate in a regular exercise program.  Talk with your doctor about the best form of exercise for you (dancing, walking, swimming, cycling).  DIET/WEIGHT Goal is to maintain a healthy weight  Your discharge diet is: Diet heart healthy/carb modified Room service appropriate?: Yes; Fluid consistency:: Thin liquids  Your height is:  Height:  (167.6 cm) Your current weight is: Weight: 59.6 kg (131 lb 6.3 oz) Your Body Mass Index (BMI) is:  BMI (Calculated): 21.3  Following the type of diet specifically designed for you will help prevent another stroke.  You are at goal weight.  Your goal Body Mass Index (BMI) is 19-24.  Healthy food habits can help reduce 3 risk factors for stroke:  High cholesterol, hypertension, and  excess weight.  RESOURCES Stroke/Support Group:  Call [phone]   STROKE EDUCATION PROVIDED/REVIEWED AND GIVEN TO PATIENT Stroke warning signs and symptoms How to activate emergency medical system (call 911). Medications prescribed at discharge. Need for follow-up after discharge. Personal risk factors for stroke. Pneumonia vaccine given:  Flu vaccine given:  My questions have been answered, the writing is legible, and I understand these instructions.  I will adhere to these goals & educational materials that have been provided to me after my discharge from the hospital.      My questions have been answered and I understand these instructions. I will adhere to these goals and the provided educational materials after my discharge from the hospital.  Patient/Caregiver Signature _______________________________ Date __________  Clinician Signature _______________________________________ Date __________  Please bring this form and your medication list with you to all your follow-up doctor's appointments.

## 2015-06-20 NOTE — Progress Notes (Signed)
Social Work Jacob Hashimoto, LCSW Social Worker Signed  Patient Care Conference 06/19/2015 10:13 AM    Expand All Collapse All   Inpatient RehabilitationTeam Conference and Plan of Care Update Date: 06/20/2015   Time: 10:35 AM    Patient Name: [name] Record Number: [id number]  Date of Birth: 12-Feb-1946 Sex: Male         Room/Bed: 4W25C/4W25C-01 Payor Info: Payor: MEDICARE / Plan: MEDICARE PART A AND B / Product Type: *No Product type* /    Admitting Diagnosis: R frontal parietal infarct   Admit Date/Time:  06/13/2015  4:17 PM Admission Comments: No comment available   Primary Diagnosis:  Stroke, acute, thrombotic, within last 8 weeks Principal Problem: Stroke, acute, thrombotic, within last 8 weeks    Patient Active Problem List     Diagnosis  Date Noted   .  Stroke, acute, thrombotic, within last 8 weeks  06/13/2015   .  Left hemiparesis     .  Poorly controlled type 2 diabetes mellitus     .  Cerebral thrombosis with cerebral infarction  06/11/2015   .  MRSA bacteremia  06/11/2015   .  Bacteremia  06/09/2015   .  Weakness generalized  06/08/2015   .  Confusion  06/08/2015   .  Dehydration  06/08/2015   .  Diabetes mellitus without complication  10/08/8526   .  Pain of right upper extremity  06/08/2015     Expected Discharge Date: Expected Discharge Date: 06/20/15  Team Members Present: Physician leading conference: Dr. Alger Simons Social Worker Present: Ovidio Kin, LCSW Nurse Present: Heather Roberts, RN PT Present: Cameron Sprang, PT OT Present: Simonne Come, OT SLP Present: Windell Moulding, SLP PPS Coordinator present : Daiva Nakayama, RN, CRRN        Current Status/Progress  Goal  Weekly Team Focus   Medical     ready for dc. goals met  prepare for dc  dc planning   Bowel/Bladder       cont B & B    cont B & B      Swallow/Nutrition/ Hydration       na         ADL's     Mod I overall  Mod I overall  DME for bathroom transfers, pt and family  education    Mobility     mod I overall   mod I overall   D/C planning   Communication       na         Safety/Cognition/ Behavioral Observations    Supervision-Min assist  no unsafe behaviors  Supervision-Min assist   education complete ready for discharge    Pain       less than 3    less than 3      Skin       no skin issues-monitor IV site            *See Care Plan and progress notes for long and short-term goals.    Barriers to Discharge:  none     Possible Resolutions to Barriers:   n/a     Discharge Planning/Teaching Needs:     Home with wife and children to assist-has done very well here and will need home IV antibiotics until 7/11.      Team Discussion:    Pt reached goals of mod/i-supervision level-family education completed with wife and son. Will provide 24 hr  care   Revisions to Treatment Plan:    Dc today    Continued Need for Acute Rehabilitation Level of Care: The patient requires daily medical management by a physician with specialized training in physical medicine and rehabilitation for the following conditions: Daily direction of a multidisciplinary physical rehabilitation program to ensure safe treatment while eliciting the highest outcome that is of practical value to the patient.: Yes Daily medical management of patient stability for increased activity during participation in an intensive rehabilitation regime.: Yes Daily analysis of laboratory values and/or radiology reports with any subsequent need for medication adjustment of medical intervention for : Post surgical problems;Neurological problems;Pulmonary problems  Jacob White 06/20/2015, 10:35 AM                  Patient ID: Jacob White, male   DOB: 1946-03-02, 69 y.o.   MRN: [id number]

## 2015-06-20 NOTE — Progress Notes (Signed)
Patient given discharge information by Marissa NestlePam Love, PA. All questions answered. Educated on the use of insulin pen, and patient's wife demonstrated understanding. Patient's wife packed all belongings taken with patient. Also IV nurse placed normal saline lock for antibiotic therapy.

## 2015-06-20 NOTE — Progress Notes (Signed)
69 y.o. male admitted to [facility] on 06/08/2015 after seeing his primary care physician for right shoulder pain. The patient injured his shoulder while trying to move a picnic table while sitting on his riding mower. During that visit the patient's wife reported that the patient has been very lethargic recently and has been increasingly confused over the past 3-4 weeks. 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 Neurology feels infarcts likely due to chronic small vessel disease and to rule out infectious source. ASA added for secondary stroke prevention. RIght shoulder MRI without evidence of infection, there is rotator cuff degeneration  Subjective/Complaints: Feeling well and prepared to go home. Good appetite. No spasms  ROS: Pt denies fever, rash/itching, headache, blurred or double vision, nausea, vomiting, abdominal pain, diarrhea, chest pain, shortness of breath, palpitations, dysuria, dizziness, neck or back pain, bleeding, anxiety, or depression   Objective: Vital Signs: Blood pressure 121/66, pulse 78, temperature 98.7 F (37.1 C), temperature source Oral, resp. rate 18, height 5' 6"  (1.676 m), weight 59.6 kg (131 lb 6.3 oz), SpO2 95 %. No results found. Results for orders placed or performed during the hospital encounter of 06/13/15 (from the past 72 hour(s))  Glucose, capillary     Status: Abnormal   Collection Time: 06/17/15 11:27 AM  Result Value Ref Range   Glucose-Capillary 212 (H) 65 - 99 mg/dL  Glucose, capillary     Status: Abnormal   Collection Time: 06/17/15  5:04 PM  Result Value Ref Range   Glucose-Capillary 194 (H) 65 - 99 mg/dL  Glucose, capillary     Status: Abnormal   Collection Time: 06/17/15  8:47 PM  Result Value Ref Range   Glucose-Capillary 204 (H) 65 - 99 mg/dL  Glucose, capillary     Status: Abnormal   Collection Time: 06/18/15  6:47 AM  Result Value Ref Range    Glucose-Capillary 198 (H) 65 - 99 mg/dL  Glucose, capillary     Status: Abnormal   Collection Time: 06/18/15 11:22 AM  Result Value Ref Range   Glucose-Capillary 244 (H) 65 - 99 mg/dL  Glucose, capillary     Status: Abnormal   Collection Time: 06/18/15  4:38 PM  Result Value Ref Range   Glucose-Capillary 231 (H) 65 - 99 mg/dL  Glucose, capillary     Status: Abnormal   Collection Time: 06/18/15  9:50 PM  Result Value Ref Range   Glucose-Capillary 127 (H) 65 - 99 mg/dL   Comment 1 Notify RN   Basic metabolic panel     Status: Abnormal   Collection Time: 06/19/15  5:16 AM  Result Value Ref Range   Sodium 133 (L) 135 - 145 mmol/L   Potassium 4.5 3.5 - 5.1 mmol/L   Chloride 99 (L) 101 - 111 mmol/L   CO2 27 22 - 32 mmol/L   Glucose, Bld 163 (H) 65 - 99 mg/dL   BUN 24 (H) 6 - 20 mg/dL   Creatinine, Ser 0.87 0.61 - 1.24 mg/dL   Calcium 8.2 (L) 8.9 - 10.3 mg/dL   GFR calc non Af Amer >60 >60 mL/min   GFR calc Af Amer >60 >60 mL/min    Comment: (NOTE) The eGFR has been calculated using the CKD EPI equation. This calculation has not been validated in all clinical situations. eGFR's persistently <60 mL/min signify possible Chronic Kidney Disease.    Anion gap 7 5 - 15  Glucose, capillary  Status: Abnormal   Collection Time: 06/19/15  6:59 AM  Result Value Ref Range   Glucose-Capillary 189 (H) 65 - 99 mg/dL   Comment 1 Notify RN   Glucose, capillary     Status: Abnormal   Collection Time: 06/19/15 11:21 AM  Result Value Ref Range   Glucose-Capillary 170 (H) 65 - 99 mg/dL  Glucose, capillary     Status: Abnormal   Collection Time: 06/19/15  4:48 PM  Result Value Ref Range   Glucose-Capillary 108 (H) 65 - 99 mg/dL  Glucose, capillary     Status: Abnormal   Collection Time: 06/19/15  9:04 PM  Result Value Ref Range   Glucose-Capillary 190 (H) 65 - 99 mg/dL  Basic metabolic panel     Status: Abnormal   Collection Time: 06/20/15  5:40 AM  Result Value Ref Range   Sodium 134 (L)  135 - 145 mmol/L   Potassium 4.1 3.5 - 5.1 mmol/L   Chloride 101 101 - 111 mmol/L   CO2 26 22 - 32 mmol/L   Glucose, Bld 109 (H) 65 - 99 mg/dL   BUN 24 (H) 6 - 20 mg/dL   Creatinine, Ser 0.76 0.61 - 1.24 mg/dL   Calcium 8.5 (L) 8.9 - 10.3 mg/dL   GFR calc non Af Amer >60 >60 mL/min   GFR calc Af Amer >60 >60 mL/min    Comment: (NOTE) The eGFR has been calculated using the CKD EPI equation. This calculation has not been validated in all clinical situations. eGFR's persistently <60 mL/min signify possible Chronic Kidney Disease.    Anion gap 7 5 - 15  Glucose, capillary     Status: Abnormal   Collection Time: 06/20/15  6:54 AM  Result Value Ref Range   Glucose-Capillary 130 (H) 65 - 99 mg/dL     HEENT: normal Cardio: RRR and no murmurs Resp: CTA B/L and unlabored. No wheezes or rales GI: BS positive and nontender nondistended Extremity:  Pulses positive and No Edema in either LE's Skin:   Intact Neuro: Alert/Oriented, Abnormal Sensory reduced sensation in bilateral hands and feet, atrophy of the hand and foot intrinsic muscles, Abnormal Motor 4+/5 in the left deltoid, biceps, triceps, grip, hip flexor, knee extensors, ankle dorsiflexion 5/5 on the right side with exception of right grip  which is  4+/5 and Abnormal FMC Ataxic/ dec FMC Musc/Skel:  Other no pain with right shoulder range of motion today,, hand and foot intrinsic muscle atrophy is noted Gen. no acute distress   Assessment/Plan: 1. Functional deficits secondary to right fontal-parietal infarct with left hemiparesis, in a patient with severe diabetic neuropathy complicated by MRSA bacteremia  which require 3+ hours per day of interdisciplinary therapy in a comprehensive inpatient rehab setting. Physiatrist is providing close team supervision and 24 hour management of active medical problems listed below. Physiatrist and rehab team continue to assess barriers to discharge/monitor patient progress toward functional and  medical goals. FIM: FIM - Bathing Bathing Steps Patient Completed: Chest, Right Arm, Left Arm, Abdomen, Front perineal area, Buttocks, Right upper leg, Left upper leg, Right lower leg (including foot), Left lower leg (including foot) Bathing: 6: Assistive device (Comment)  FIM - Upper Body Dressing/Undressing Upper body dressing/undressing steps patient completed: Thread/unthread right sleeve of pullover shirt/dresss, Put head through opening of pull over shirt/dress, Thread/unthread left sleeve of pullover shirt/dress, Pull shirt over trunk Upper body dressing/undressing: 7: Complete Independence: No helper FIM - Lower Body Dressing/Undressing Lower body dressing/undressing steps patient completed: Thread/unthread  right underwear leg, Thread/unthread left underwear leg, Pull underwear up/down, Thread/unthread right pants leg, Thread/unthread left pants leg, Pull pants up/down, Fasten/unfasten pants, Don/Doff right sock, Don/Doff left sock, Don/Doff right shoe, Don/Doff left shoe, Fasten/unfasten right shoe, Fasten/unfasten left shoe Lower body dressing/undressing: 6: More than reasonable amount of time  FIM - Toileting Toileting steps completed by patient: Adjust clothing prior to toileting, Performs perineal hygiene, Adjust clothing after toileting Toileting: 6: More than reasonable amount of time  FIM - Radio producer Devices: Grab bars Toilet Transfers: 6-Assistive device: No helper, 6-To toilet/ BSC, 6-From toilet/BSC  FIM - Control and instrumentation engineer Devices: Bed rails Bed/Chair Transfer: 6: More than reasonable amt of time  FIM - Locomotion: Wheelchair Distance:  (pt ambulatory) Locomotion: Wheelchair: 0: Activity did not occur FIM - Locomotion: Ambulation Locomotion: Ambulation Assistive Devices: Other (comment) (none) Ambulation/Gait Assistance: 6: Modified independent (Device/Increase time) Locomotion: Ambulation: 6: Travels  150 ft or more independently/takes more than reasonable amount of time  Comprehension Comprehension Mode: Auditory Comprehension: 5-Follows basic conversation/direction: With no assist  Expression Expression Mode: Verbal Expression: 5-Expresses basic needs/ideas: With no assist  Social Interaction Social Interaction: 6-Interacts appropriately with others with medication or extra time (anti-anxiety, antidepressant).  Problem Solving Problem Solving: 5-Solves basic 90% of the time/requires cueing < 10% of the time  Memory Memory: 5-Requires cues to use assistive device  Medical Problem List and Plan: 1. Functional deficits secondary to right fontal-parietal infarct complicated by MRSA bacteremia (?source) 2.  DVT Prophylaxis/Anticoagulation: Pharmaceutical: Lovenox--continue until dc 3. Pain Management: Denies any back or shoulder pain. Tylenol prn effective 4. Mood: LCSW to follow for evaluation and support.   5. Neuropsych: This patient is not capable of making decisions on his own behalf. 6. Skin/Wound Care: Routine pressure relief measures.  nutrition 7. Fluids/Electrolytes/Nutrition: continue to push fluids  -I personally reviewed the patient's labs today and discussed with patient    8. MRSA Bacteremia: Continue Vancomycin through July 11 th with trough goal 15-20 as long as blood cultures remain negative.      10 Hypokalemia: Continue supplement for now.  .  .   11. DM type 2 with polyneuropathy: cbg's improving but still suboptimal :   lantus-- will increase to 12 u qam  Upon discharge today    - Increased metformin to 1020m bid earlier this week 12. HTN: bp under control. Continue prinivil with good results (off diuretic--not needed at discharge)   LOS (Days) 7 A FACE TO FACE EVALUATION WAS PERFORMED  Dayane Hillenburg T 06/20/2015, 9:49 AM

## 2015-07-05 ENCOUNTER — Inpatient Hospital Stay: Payer: Medicare Other | Admitting: Infectious Disease

## 2015-07-12 DIAGNOSIS — I69354 Hemiplegia and hemiparesis following cerebral infarction affecting left non-dominant side: Secondary | ICD-10-CM | POA: Diagnosis not present

## 2015-07-12 DIAGNOSIS — R7881 Bacteremia: Secondary | ICD-10-CM | POA: Diagnosis not present

## 2015-07-12 DIAGNOSIS — Z452 Encounter for adjustment and management of vascular access device: Secondary | ICD-10-CM | POA: Diagnosis not present

## 2015-07-12 DIAGNOSIS — B9562 Methicillin resistant Staphylococcus aureus infection as the cause of diseases classified elsewhere: Secondary | ICD-10-CM | POA: Diagnosis not present

## 2015-07-24 ENCOUNTER — Encounter: Payer: Self-pay | Admitting: Internal Medicine

## 2015-07-31 ENCOUNTER — Inpatient Hospital Stay: Payer: Medicare Other | Admitting: Physical Medicine & Rehabilitation

## 2015-07-31 ENCOUNTER — Ambulatory Visit: Payer: Medicare Other

## 2015-08-03 ENCOUNTER — Encounter: Payer: Self-pay | Admitting: Internal Medicine

## 2015-10-31 ENCOUNTER — Encounter: Payer: Self-pay | Admitting: Internal Medicine

## 2020-09-10 ENCOUNTER — Other Ambulatory Visit: Payer: Self-pay

## 2020-09-10 ENCOUNTER — Inpatient Hospital Stay
Admission: EM | Admit: 2020-09-10 | Discharge: 2020-10-15 | DRG: 064 | Disposition: E | Payer: Medicare Other | Attending: Internal Medicine | Admitting: Internal Medicine

## 2020-09-10 ENCOUNTER — Inpatient Hospital Stay: Payer: Medicare Other

## 2020-09-10 ENCOUNTER — Emergency Department: Payer: Medicare Other

## 2020-09-10 ENCOUNTER — Encounter: Payer: Self-pay | Admitting: Emergency Medicine

## 2020-09-10 DIAGNOSIS — N179 Acute kidney failure, unspecified: Secondary | ICD-10-CM | POA: Diagnosis present

## 2020-09-10 DIAGNOSIS — R4182 Altered mental status, unspecified: Secondary | ICD-10-CM

## 2020-09-10 DIAGNOSIS — E538 Deficiency of other specified B group vitamins: Secondary | ICD-10-CM | POA: Diagnosis present

## 2020-09-10 DIAGNOSIS — E1165 Type 2 diabetes mellitus with hyperglycemia: Secondary | ICD-10-CM | POA: Diagnosis present

## 2020-09-10 DIAGNOSIS — R972 Elevated prostate specific antigen [PSA]: Secondary | ICD-10-CM | POA: Diagnosis present

## 2020-09-10 DIAGNOSIS — Z79899 Other long term (current) drug therapy: Secondary | ICD-10-CM

## 2020-09-10 DIAGNOSIS — Z681 Body mass index (BMI) 19 or less, adult: Secondary | ICD-10-CM

## 2020-09-10 DIAGNOSIS — E871 Hypo-osmolality and hyponatremia: Secondary | ICD-10-CM | POA: Diagnosis present

## 2020-09-10 DIAGNOSIS — Z66 Do not resuscitate: Secondary | ICD-10-CM | POA: Diagnosis not present

## 2020-09-10 DIAGNOSIS — I10 Essential (primary) hypertension: Secondary | ICD-10-CM | POA: Diagnosis present

## 2020-09-10 DIAGNOSIS — E039 Hypothyroidism, unspecified: Secondary | ICD-10-CM | POA: Diagnosis present

## 2020-09-10 DIAGNOSIS — Z856 Personal history of leukemia: Secondary | ICD-10-CM

## 2020-09-10 DIAGNOSIS — G9341 Metabolic encephalopathy: Secondary | ICD-10-CM

## 2020-09-10 DIAGNOSIS — E1169 Type 2 diabetes mellitus with other specified complication: Secondary | ICD-10-CM | POA: Diagnosis present

## 2020-09-10 DIAGNOSIS — F0390 Unspecified dementia without behavioral disturbance: Secondary | ICD-10-CM | POA: Diagnosis present

## 2020-09-10 DIAGNOSIS — Z794 Long term (current) use of insulin: Secondary | ICD-10-CM

## 2020-09-10 DIAGNOSIS — R54 Age-related physical debility: Secondary | ICD-10-CM | POA: Diagnosis present

## 2020-09-10 DIAGNOSIS — E785 Hyperlipidemia, unspecified: Secondary | ICD-10-CM | POA: Diagnosis present

## 2020-09-10 DIAGNOSIS — I639 Cerebral infarction, unspecified: Secondary | ICD-10-CM

## 2020-09-10 DIAGNOSIS — E43 Unspecified severe protein-calorie malnutrition: Secondary | ICD-10-CM | POA: Diagnosis present

## 2020-09-10 DIAGNOSIS — R627 Adult failure to thrive: Secondary | ICD-10-CM | POA: Diagnosis present

## 2020-09-10 DIAGNOSIS — I69354 Hemiplegia and hemiparesis following cerebral infarction affecting left non-dominant side: Secondary | ICD-10-CM | POA: Diagnosis not present

## 2020-09-10 DIAGNOSIS — R3129 Other microscopic hematuria: Secondary | ICD-10-CM | POA: Diagnosis present

## 2020-09-10 DIAGNOSIS — Z515 Encounter for palliative care: Secondary | ICD-10-CM | POA: Diagnosis not present

## 2020-09-10 DIAGNOSIS — Z9221 Personal history of antineoplastic chemotherapy: Secondary | ICD-10-CM

## 2020-09-10 DIAGNOSIS — Z8614 Personal history of Methicillin resistant Staphylococcus aureus infection: Secondary | ICD-10-CM

## 2020-09-10 DIAGNOSIS — I63413 Cerebral infarction due to embolism of bilateral middle cerebral arteries: Principal | ICD-10-CM | POA: Diagnosis present

## 2020-09-10 DIAGNOSIS — Z20822 Contact with and (suspected) exposure to covid-19: Secondary | ICD-10-CM | POA: Diagnosis present

## 2020-09-10 DIAGNOSIS — E860 Dehydration: Secondary | ICD-10-CM | POA: Diagnosis present

## 2020-09-10 DIAGNOSIS — Z87891 Personal history of nicotine dependence: Secondary | ICD-10-CM

## 2020-09-10 DIAGNOSIS — R339 Retention of urine, unspecified: Secondary | ICD-10-CM | POA: Diagnosis present

## 2020-09-10 DIAGNOSIS — Z9114 Patient's other noncompliance with medication regimen: Secondary | ICD-10-CM

## 2020-09-10 DIAGNOSIS — R64 Cachexia: Secondary | ICD-10-CM | POA: Diagnosis present

## 2020-09-10 DIAGNOSIS — Z7189 Other specified counseling: Secondary | ICD-10-CM

## 2020-09-10 DIAGNOSIS — Z7982 Long term (current) use of aspirin: Secondary | ICD-10-CM

## 2020-09-10 DIAGNOSIS — Z79890 Hormone replacement therapy (postmenopausal): Secondary | ICD-10-CM

## 2020-09-10 LAB — COMPREHENSIVE METABOLIC PANEL
ALT: 22 U/L (ref 0–44)
AST: 39 U/L (ref 15–41)
Albumin: 3.3 g/dL — ABNORMAL LOW (ref 3.5–5.0)
Alkaline Phosphatase: 94 U/L (ref 38–126)
Anion gap: 15 (ref 5–15)
BUN: 107 mg/dL — ABNORMAL HIGH (ref 8–23)
CO2: 26 mmol/L (ref 22–32)
Calcium: 8.8 mg/dL — ABNORMAL LOW (ref 8.9–10.3)
Chloride: 97 mmol/L — ABNORMAL LOW (ref 98–111)
Creatinine, Ser: 2.92 mg/dL — ABNORMAL HIGH (ref 0.61–1.24)
GFR calc Af Amer: 23 mL/min — ABNORMAL LOW (ref >60)
GFR calc non Af Amer: 20 mL/min — ABNORMAL LOW (ref >60)
Glucose, Bld: 341 mg/dL — ABNORMAL HIGH (ref 70–99)
Potassium: 4.3 mmol/L (ref 3.5–5.1)
Sodium: 138 mmol/L (ref 135–145)
Total Bilirubin: 1.2 mg/dL (ref 0.3–1.2)
Total Protein: 7.2 g/dL (ref 6.5–8.1)

## 2020-09-10 LAB — URINALYSIS, COMPLETE (UACMP) WITH MICROSCOPIC
Bilirubin Urine: NEGATIVE
Glucose, UA: >=500 mg/dL — AB
Ketones, ur: 5 mg/dL — AB
Nitrite: NEGATIVE
Protein, ur: 30 mg/dL — AB
Specific Gravity, Urine: 1.016 (ref 1.005–1.030)
pH: 5 (ref 5.0–8.0)

## 2020-09-10 LAB — CBC
HCT: 51 % (ref 39.0–52.0)
Hemoglobin: 17 g/dL (ref 13.0–17.0)
MCH: 29 pg (ref 26.0–34.0)
MCHC: 33.3 g/dL (ref 30.0–36.0)
MCV: 87 fL (ref 80.0–100.0)
Platelets: 152 10*3/uL (ref 150–400)
RBC: 5.86 MIL/uL — ABNORMAL HIGH (ref 4.22–5.81)
RDW: 13 % (ref 11.5–15.5)
WBC: 8.4 10*3/uL (ref 4.0–10.5)
nRBC: 0 % (ref 0.0–0.2)

## 2020-09-10 LAB — BRAIN NATRIURETIC PEPTIDE: B Natriuretic Peptide: 41.7 pg/mL (ref 0.0–100.0)

## 2020-09-10 LAB — PROTIME-INR
INR: 1.1 (ref 0.8–1.2)
Prothrombin Time: 13.3 seconds (ref 11.4–15.2)

## 2020-09-10 LAB — GLUCOSE, CAPILLARY
Glucose-Capillary: 302 mg/dL — ABNORMAL HIGH (ref 70–99)
Glucose-Capillary: 261 mg/dL — ABNORMAL HIGH (ref 70–99)
Glucose-Capillary: 184 mg/dL — ABNORMAL HIGH (ref 70–99)

## 2020-09-10 LAB — LACTIC ACID, PLASMA: Lactic Acid, Venous: <0.3 mmol/L — ABNORMAL LOW (ref 0.5–1.9)

## 2020-09-10 LAB — TSH: TSH: 1.257 u[IU]/mL (ref 0.350–4.500)

## 2020-09-10 LAB — RESPIRATORY PANEL BY RT PCR (FLU A&B, COVID)
Influenza A by PCR: NEGATIVE
Influenza B by PCR: NEGATIVE
SARS Coronavirus 2 by RT PCR: NEGATIVE

## 2020-09-10 LAB — APTT: aPTT: 29 seconds (ref 24–36)

## 2020-09-10 MED ORDER — INSULIN ASPART 100 UNIT/ML ~~LOC~~ SOLN
0.0000 [IU] | Freq: Three times a day (TID) | SUBCUTANEOUS | Status: DC
  Administered 2020-09-10: 5 [IU] via SUBCUTANEOUS
  Administered 2020-09-11: 1 [IU] via SUBCUTANEOUS
  Administered 2020-09-12: 2 [IU] via SUBCUTANEOUS
  Filled 2020-09-10: qty 1

## 2020-09-10 MED ORDER — LEVOTHYROXINE SODIUM 25 MCG PO TABS
25.0000 ug | ORAL_TABLET | Freq: Every day | ORAL | Status: DC
  Administered 2020-09-12: 25 ug via ORAL
  Filled 2020-09-10: qty 1

## 2020-09-10 MED ORDER — ONDANSETRON HCL 4 MG/2ML IJ SOLN: 4.0000 mg | Freq: Four times a day (QID) | INTRAMUSCULAR | Status: DC | PRN

## 2020-09-10 MED ORDER — HYDRALAZINE HCL 25 MG PO TABS: 25.0000 mg | ORAL_TABLET | Freq: Three times a day (TID) | ORAL | Status: DC | PRN

## 2020-09-10 MED ORDER — LACTATED RINGERS IV BOLUS
1000.0000 mL | Freq: Once | INTRAVENOUS | Status: AC
  Administered 2020-09-10: 1000 mL via INTRAVENOUS

## 2020-09-10 MED ORDER — ACETAMINOPHEN 650 MG RE SUPP: 650.0000 mg | Freq: Four times a day (QID) | RECTAL | Status: DC | PRN

## 2020-09-10 MED ORDER — ACETAMINOPHEN 325 MG PO TABS: 650.0000 mg | ORAL_TABLET | Freq: Four times a day (QID) | ORAL | Status: DC | PRN

## 2020-09-10 MED ORDER — DONEPEZIL HCL 5 MG PO TABS
10.0000 mg | ORAL_TABLET | Freq: Every day | ORAL | Status: DC
  Administered 2020-09-10 – 2020-09-11 (×2): 10 mg via ORAL
  Filled 2020-09-10 (×2): qty 2

## 2020-09-10 MED ORDER — TAMSULOSIN HCL 0.4 MG PO CAPS
0.4000 mg | ORAL_CAPSULE | Freq: Every day | ORAL | Status: DC
  Administered 2020-09-10: 0.4 mg via ORAL
  Filled 2020-09-10 (×2): qty 1

## 2020-09-10 MED ORDER — HEPARIN SODIUM (PORCINE) 5000 UNIT/ML IJ SOLN
5000.0000 [IU] | Freq: Three times a day (TID) | INTRAMUSCULAR | Status: DC
  Administered 2020-09-10 – 2020-09-13 (×8): 5000 [IU] via SUBCUTANEOUS
  Filled 2020-09-10 (×6): qty 1

## 2020-09-10 MED ORDER — INSULIN ASPART 100 UNIT/ML ~~LOC~~ SOLN: 0.0000 [IU] | Freq: Every day | SUBCUTANEOUS | Status: DC

## 2020-09-10 MED ORDER — ONDANSETRON HCL 4 MG PO TABS: 4.0000 mg | ORAL_TABLET | Freq: Four times a day (QID) | ORAL | Status: DC | PRN

## 2020-09-10 MED ORDER — POLYETHYLENE GLYCOL 3350 17 G PO PACK: 17.0000 g | PACK | Freq: Every day | ORAL | Status: DC | PRN

## 2020-09-10 MED ORDER — LACTATED RINGERS IV SOLN: INTRAVENOUS | Status: DC

## 2020-09-10 MED ORDER — PRAVASTATIN SODIUM 20 MG PO TABS
40.0000 mg | ORAL_TABLET | Freq: Every day | ORAL | Status: DC
  Administered 2020-09-10 – 2020-09-11 (×2): 40 mg via ORAL
  Filled 2020-09-10 (×2): qty 1
  Filled 2020-09-10: qty 2
  Filled 2020-09-10: qty 1

## 2020-09-10 MED ORDER — LACTATED RINGERS BOLUS PEDS: 1000.0000 mL | Freq: Once | INTRAVENOUS | Status: DC

## 2020-09-10 NOTE — ED Notes (Signed)
Pt O2 sat decreased to 87% on RA. Dr. Vicente Males, MD placed pt on 2L [place]. Pt O2 sat increased to mid-90s on 2L [place].

## 2020-09-10 NOTE — ED Notes (Signed)
Offered pt's food tray at this time. Pt states that he does not have an appetite.

## 2020-09-10 NOTE — ED Notes (Signed)
Hospitalist at bedside at this time 

## 2020-09-10 NOTE — ED Provider Notes (Signed)
[facility] Emergency Department Provider Note   ____________________________________________   First MD Initiated Contact with Patient 08/17/2020 1454     (approximate)  I have reviewed the triage vital signs and the nursing notes.   HISTORY  Chief Complaint Altered Mental Status    HPI Jacob White is a 74 y.o. 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  Patient only states that he has sores on his legs from mowing the lawn approximately 2 days prior to arrival.  This is his only complaint at this time.  On further review of systems patient does endorse recent vomiting as well as chills over the past 24 hours.  Patient also endorses generalized weakness.  Patient states that the symptoms have been stable since onset.         Past Medical History:  Diagnosis Date  . Diabetes mellitus without complication ([place])   . Hyperlipemia   . Hypertension     Patient Active Problem List   Diagnosis Date Noted  . Stroke, acute, thrombotic, within last 8 weeks 06/13/2015  . Left hemiparesis ([place])   . Poorly controlled type 2 diabetes mellitus ([place])   . Cerebral thrombosis with cerebral infarction ([place]) 06/11/2015  . MRSA bacteremia 06/11/2015  . Bacteremia 06/09/2015  . Weakness generalized 06/08/2015  . Confusion 06/08/2015  . Dehydration 06/08/2015  . Diabetes mellitus without complication ([place]) 11/94/1740  . Pain of right upper extremity 06/08/2015    Past Surgical History:  Procedure Laterality Date  . TEE WITHOUT CARDIOVERSION N/A 06/12/2015   Procedure: TRANSESOPHAGEAL ECHOCARDIOGRAM (TEE);  Surgeon: Pixie Casino, MD;  Location: [facility] ENDOSCOPY;  Service: Cardiovascular;  Laterality: N/A;    Prior to Admission medications   Medication Sig Start Date End Date Taking? Authorizing Provider  donepezil (ARICEPT) 10 MG  tablet Take 10 mg by mouth daily. 08/22/20  Yes [provider]  Dulaglutide (TRULICITY) 8.14 GY/1.8HU SOPN Inject 0.75 mg into the skin every Tuesday.   Yes [provider]  EUTHYROX 25 MCG tablet Take 25 mcg by mouth daily. 08/23/20  Yes [provider]  lisinopril-hydrochlorothiazide (ZESTORETIC) 20-25 MG tablet Take 1 tablet by mouth daily. 08/22/20  Yes [provider]  metFORMIN (GLUCOPHAGE) 1000 MG tablet Take 1,000 mg by mouth 2 (two) times daily with a meal.   Yes [provider]  pravastatin (PRAVACHOL) 40 MG tablet Take 40 mg by mouth daily.   Yes [provider]  aspirin EC 325 MG EC tablet Take 1 tablet (325 mg total) by mouth daily. Patient not taking: Reported on 09/06/2020 06/20/15   Bary Leriche, PA-C  blood glucose meter kit and supplies KIT Dispense based on patient and insurance preference. Use up to four times daily as directed. (FOR ICD-9 250.00, 250.01). 06/20/15   Love, Ivan Anchors, PA-C  Insulin Glargine (LANTUS SOLOSTAR) 100 UNIT/ML Solostar Pen Inject 12 Units into the skin daily with breakfast. Patient not taking: Reported on 08/29/2020 06/20/15   Love, Ivan Anchors, PA-C  Insulin Pen Needle (CAREFINE PEN NEEDLES) 32G X 5 MM MISC 1 applicator by Does not apply route daily with breakfast. 06/20/15   Love, Ivan Anchors, PA-C  lisinopril (PRINIVIL,ZESTRIL) 20 MG tablet Take 1 tablet (20 mg total) by mouth daily after supper. Patient not taking: Reported on 08/22/2020 06/20/15   Love, Ivan Anchors, PA-C  Vancomycin (VANCOCIN) 750 MG/150ML  SOLN Inject 150 mLs (750 mg total) into the vein every 12 (twelve) hours. Patient not taking: Reported on 09/13/2020 06/20/15   Bary Leriche, PA-C    Allergies Patient has no known allergies.  History reviewed. No pertinent family history.  Social History Social History   Tobacco Use  . Smoking status: Never Smoker  Substance Use Topics  . Alcohol use: No  . Drug use: Not on file    Review of  Systems Constitutional: Endorses fever/chills Eyes: No visual changes. ENT: No sore throat. Cardiovascular: Denies chest pain. Respiratory: Denies shortness of breath. Gastrointestinal: No abdominal pain.  No nausea, no vomiting.  No diarrhea. Genitourinary: Negative for dysuria. Musculoskeletal: Negative for acute arthralgias Skin: Negative for rash. Neurological: Endorses generalized weakness.  Negative for headaches, numbness/paresthesias in any extremity Psychiatric: Negative for suicidal ideation/homicidal ideation   ____________________________________________   PHYSICAL EXAM:  VITAL SIGNS: ED Triage Vitals  Enc Vitals Group     BP 09/08/2020 1500 98/75     Pulse Rate 08/22/2020 1453 85     Resp 09/11/2020 1453 13     Temp --      Temp Source 08/20/2020 1453 Oral     SpO2 09/08/2020 1453 97 %     Weight 09/06/2020 1454 120 lb (54.4 kg)     Height 09/01/2020 1454 _0  (1.676 m)     Head Circumference --      Peak Flow --      Pain Score 09/04/2020 1454 0     Pain Loc --      Pain Edu? --      Excl. in [place]? --    Constitutional: Alert and oriented.  Cachectic and in no acute distress. Eyes: Conjunctivae are normal. PERRL. Head: Atraumatic. Nose: No congestion/rhinnorhea. Mouth/Throat: Mucous membranes are moist. Neck: No stridor Cardiovascular: Grossly normal heart sounds.  Good peripheral circulation. Respiratory: Normal respiratory effort.  No retractions. Gastrointestinal: Soft and nontender. No distention. Musculoskeletal: No obvious deformities Neurologic:  Normal speech and language. No gross focal neurologic deficits are appreciated. Skin:  Skin is warm and dry. No rash noted. Psychiatric: Mood and affect are normal. Speech and behavior are normal.  ____________________________________________   LABS (all labs ordered are listed, but only abnormal results are displayed)  Labs Reviewed  COMPREHENSIVE METABOLIC PANEL - Abnormal; Notable for the following components:       Result Value   Chloride 97 (*)    Glucose, Bld 341 (*)    BUN 107 (*)    Creatinine, Ser 2.92 (*)    Calcium 8.8 (*)    Albumin 3.3 (*)    GFR calc non Af Amer 20 (*)    GFR calc Af Amer 23 (*)    All other components within normal limits  CBC - Abnormal; Notable for the following components:   RBC 5.86 (*)    All other components within normal limits  LACTIC ACID, PLASMA - Abnormal; Notable for the following components:   Lactic Acid, Venous <0.3 (*)    All other components within normal limits  GLUCOSE, CAPILLARY - Abnormal; Notable for the following components:   Glucose-Capillary 302 (*)    All other components within normal limits  RESPIRATORY PANEL BY RT PCR (FLU A&B, COVID)  CULTURE, BLOOD (SINGLE)  URINE CULTURE  APTT  PROTIME-INR  URINALYSIS, COMPLETE (UACMP) WITH MICROSCOPIC  CBG MONITORING, ED   ____________________________________________  EKG  ED ECG REPORT I, Naaman Plummer, the attending physician, personally viewed and  interpreted this ECG.  Date: 09/12/2020 EKG Time: 1429 Rate: 87 Rhythm: normal sinus rhythm QRS Axis: normal Intervals: normal ST/T Wave abnormalities: normal Narrative Interpretation: no evidence of acute ischemia  ____________________________________________  RADIOLOGY  ED MD interpretation: Portable 1 view chest x-ray shows no evidence of acute pneumonia, pneumothorax, or widened mediastinum  Official radiology report(s): DG Chest Port 1 View  Result Date: 08/17/2020 CLINICAL DATA:  Lethargy and altered mental status. EXAM: PORTABLE CHEST 1 VIEW COMPARISON:  June 09, 2015 FINDINGS: Multiple overlying radiopaque cardiac lead wires are seen. The heart size and mediastinal contours are within normal limits. Tortuosity of the descending thoracic aorta is seen. Both lungs are clear. The visualized skeletal structures are unremarkable. IMPRESSION: 1. No acute or active cardiopulmonary disease. Electronically Signed   By: Virgina Norfolk M.D.   On: 09/05/2020 16:09    ____________________________________________   PROCEDURES  Procedure(s) performed (including Critical Care):  .1-3 Lead EKG Interpretation Performed by: Naaman Plummer, MD Authorized by: Naaman Plummer, MD     Interpretation: normal     ECG rate:  81   ECG rate assessment: normal     Rhythm: sinus rhythm     Ectopy: none     Conduction: normal       ____________________________________________   INITIAL IMPRESSION / ASSESSMENT AND PLAN / ED COURSE  As part of my medical decision making, I reviewed the following data within the [facility] notes reviewed and incorporated, Labs reviewed, EKG interpreted, Old chart reviewed, Radiograph reviewed and Notes from prior ED visits reviewed and incorporated       This patient presents with generalized weakness and fatigue likely secondary to dehydration. Suspect acute kidney injury of prerenal origin. Doubt intrinsic renal dysfunction or obstructive nephropathy. Considered alternate etiologies of the patients symptoms including infectious processes, severe metabolic derangements or electrolyte abnormalities, ischemia/ACS, heart failure, and intracranial/central processes but think these are unlikely given the history and physical exam.  Plan: labs, 2 L fluid resuscitation, pain/nausea control, reassessment  Dispo: Admit       ____________________________________________   FINAL CLINICAL IMPRESSION(S) / ED DIAGNOSES  Final diagnoses:  AKI (acute kidney injury) ([place])  Altered mental status, unspecified altered mental status type  Dehydration     ED Discharge Orders    None       Note:  This document was prepared using Dragon voice recognition software and may include unintentional dictation errors.   Naaman Plummer, MD 08/18/2020 [phone]

## 2020-09-10 NOTE — H&P (Addendum)
History and Physical    Jacob White DJM:[id number] DOB: [birth year] DOA: 09/09/2020  PCP: Patient, No Pcp Per   Patient coming from: Home  I have personally briefly reviewed patient's old medical records in [place]  Chief Complaint: Not feeling well  HPI: Jacob White is a 74 y.o. male with medical history significant of type 2 diabetes mellitus, hypertension and hyperlipidemia was brought to ED via EMS after family called that he is sick. Patient also has some underlying dementia.  Daughter-in-law was present at the time of interview in the room.  According to her patient he is not doing well for the past couple of months.  Gradually declining, poor p.o. intake.  He needs assistance with most of his ADLs.  Until few days ago he was able to walk with some help in the past 3 days he was not walking either.  Per patient he was feeling overall weak with some pain in his legs for the past month. Patient stopped seeing any physicians for few years.  Recently they took him to a physician in [place] due to gradual decline in his health and poor p.o. intake.  According to daughter-in-law they told them that patient has some gallstones and kidneys are not working well.  No records or documentation found on epic.  Renal functions were normal 5 years ago.  Patient stopped taking his diabetic medication and blood pressure medications.  Recently restarted but refusing most of the time.  He was placed on Trulicity and Metformin along with lisinopril and HCTZ by that provider.  Patient denies any headaches, he is unvaccinated, denies any upper respiratory symptoms or sick contacts.  Denies any fever or chills.  No chest pain or shortness of breath.  Having some nausea and vomiting for the past few days, vomited once yesterday, continue to have some nausea, had diarrhea 2 days ago, no BM since this morning.  Per patient he did urinated earlier today but it was a very small amount.  Poor appetite and he is  losing weight due to poor p.o. intake.  Never had any screening colonoscopy.  Denies any urinary symptoms, stating that he is not peeing the way he used to pee before.  Unable to describe if there is any difficulty with pain but stating that urine quantity is less.  Denies any gross hematuria, melena or blood in stool.  Patient quit smoking at the age of 1.  No alcohol for the past many years. Used to work in [employer], now retired.  Lives with his wife and grand low kids.  ED Course: Hemodynamically stable, afebrile, labs positive for blood glucose of 341, BUN of 107 and creatinine of 2.92.  Chest x-ray without any acute changes, COVID-19 negative.  Review of Systems: As per HPI otherwise 10 point review of systems negative.   Past Medical History:  Diagnosis Date  . Diabetes mellitus without complication ([place])   . Hyperlipemia   . Hypertension     Past Surgical History:  Procedure Laterality Date  . TEE WITHOUT CARDIOVERSION N/A 06/12/2015   Procedure: TRANSESOPHAGEAL ECHOCARDIOGRAM (TEE);  Surgeon: Pixie Casino, MD;  Location[facility] ENDOSCOPY;  Service: Cardiovascular;  Laterality: N/A;     reports that he has never smoked. He does not have any smokeless tobacco history on file. He reports that he does not drink alcohol. No history on file for drug use.  No Known Allergies  History reviewed. No pertinent family history.  Prior to Admission medications  Medication Sig Start Date End Date Taking? Authorizing Provider  donepezil (ARICEPT) 10 MG tablet Take 10 mg by mouth daily. 08/22/20  Yes [provider]  Dulaglutide (TRULICITY) 1.32 GM/0.1UU SOPN Inject 0.75 mg into the skin every Tuesday.   Yes [provider]  EUTHYROX 25 MCG tablet Take 25 mcg by mouth daily. 08/23/20  Yes [provider]  lisinopril-hydrochlorothiazide (ZESTORETIC) 20-25 MG tablet Take 1 tablet by mouth daily. 08/22/20  Yes [provider]  metFORMIN (GLUCOPHAGE) 1000 MG  tablet Take 1,000 mg by mouth 2 (two) times daily with a meal.   Yes [provider]  pravastatin (PRAVACHOL) 40 MG tablet Take 40 mg by mouth daily.   Yes [provider]  aspirin EC 325 MG EC tablet Take 1 tablet (325 mg total) by mouth daily. Patient not taking: Reported on 08/26/2020 06/20/15   Bary Leriche, PA-C  blood glucose meter kit and supplies KIT Dispense based on patient and insurance preference. Use up to four times daily as directed. (FOR ICD-9 250.00, 250.01). 06/20/15   Love, Ivan Anchors, PA-C  Insulin Glargine (LANTUS SOLOSTAR) 100 UNIT/ML Solostar Pen Inject 12 Units into the skin daily with breakfast. Patient not taking: Reported on 08/29/2020 06/20/15   Love, Ivan Anchors, PA-C  Insulin Pen Needle (CAREFINE PEN NEEDLES) 32G X 5 MM MISC 1 applicator by Does not apply route daily with breakfast. 06/20/15   Love, Ivan Anchors, PA-C  lisinopril (PRINIVIL,ZESTRIL) 20 MG tablet Take 1 tablet (20 mg total) by mouth daily after supper. Patient not taking: Reported on 09/03/2020 06/20/15   Love, Ivan Anchors, PA-C  Vancomycin (VANCOCIN) 750 MG/150ML SOLN Inject 150 mLs (750 mg total) into the vein every 12 (twelve) hours. Patient not taking: Reported on 08/20/2020 06/20/15   Flora Lipps    Physical Exam: Vitals:   08/24/2020 1530 08/25/2020 1600 09/09/2020 1630 08/28/2020 1700  BP: 107/83 96/73 136/89 (!) 148/99  Pulse: 71 69 61 62  Resp: 19 20 15 15   TempSrc:      SpO2: 96% 93% 100% 100%  Weight:      Height:        General: Vital signs reviewed.  Patient is frail, emaciated, elderly man, in no acute distress and cooperative with exam.  Head: Normocephalic and atraumatic. Eyes: EOMI, conjunctivae normal, no scleral icterus.  ENMT: Mucous membranes are dry.  No teeth. Neck: Supple, trachea midline, normal ROM,  Cardiovascular: RRR, S1 normal, S2 normal, no murmurs, gallops, or rubs. Pulmonary/Chest: Clear to auscultation bilaterally, no wheezes, rales, or rhonchi. Abdominal: Soft,  non-tender, non-distended, BS +,  Extremities: No lower extremity edema bilaterally,  pulses symmetric and intact bilaterally. Neurological: A&O x3, Strength is normal and symmetric bilaterally, cranial nerve II-XII are grossly intact, no focal motor deficit, sensory intact to light touch bilaterally.  Skin: Warm, dry and intact. Psychiatric: Normal mood and affect.   Labs on Admission: I have personally reviewed following labs and imaging studies  CBC: Recent Labs  Lab 08/23/2020 1457  WBC 8.4  HGB 17.0  HCT 51.0  MCV 87.0  PLT 725   Basic Metabolic Panel: Recent Labs  Lab 08/23/2020 1457  NA 138  K 4.3  CL 97*  CO2 26  GLUCOSE 341*  BUN 107*  CREATININE 2.92*  CALCIUM 8.8*   GFR: Estimated Creatinine Clearance: 16.8 mL/min (A) (by C-G formula based on SCr of 2.92 mg/dL (H)). Liver Function Tests: Recent Labs  Lab 09/11/2020 1457  AST 39  ALT 22  ALKPHOS 94  BILITOT 1.2  PROT 7.2  ALBUMIN 3.3*   No results for input(s): LIPASE, AMYLASE in the last 168 hours. No results for input(s): AMMONIA in the last 168 hours. Coagulation Profile: Recent Labs  Lab 08/16/2020 1457  INR 1.1   Cardiac Enzymes: No results for input(s): CKTOTAL, CKMB, CKMBINDEX, TROPONINI in the last 168 hours. BNP (last 3 results) No results for input(s): PROBNP in the last 8760 hours. HbA1C: No results for input(s): HGBA1C in the last 72 hours. CBG: Recent Labs  Lab 08/26/2020 1525  GLUCAP 302*   Lipid Profile: No results for input(s): CHOL, HDL, LDLCALC, TRIG, CHOLHDL, LDLDIRECT in the last 72 hours. Thyroid Function Tests: No results for input(s): TSH, T4TOTAL, FREET4, T3FREE, THYROIDAB in the last 72 hours. Anemia Panel: No results for input(s): VITAMINB12, FOLATE, FERRITIN, TIBC, IRON, RETICCTPCT in the last 72 hours. Urine analysis:    Component Value Date/Time   COLORURINE YELLOW 06/09/2015 0306   APPEARANCEUR CLOUDY (A) 06/09/2015 0306   LABSPEC 1.021 06/09/2015 0306   PHURINE  5.5 06/09/2015 0306   GLUCOSEU >1000 (A) 06/09/2015 0306   HGBUR MODERATE (A) 06/09/2015 0306   BILIRUBINUR NEGATIVE 06/09/2015 0306   KETONESUR 15 (A) 06/09/2015 0306   PROTEINUR 100 (A) 06/09/2015 0306   UROBILINOGEN 1.0 06/09/2015 0306   NITRITE NEGATIVE 06/09/2015 0306   LEUKOCYTESUR SMALL (A) 06/09/2015 0306    Radiological Exams on Admission: DG Chest Port 1 View  Result Date: 08/28/2020 CLINICAL DATA:  Lethargy and altered mental status. EXAM: PORTABLE CHEST 1 VIEW COMPARISON:  June 09, 2015 FINDINGS: Multiple overlying radiopaque cardiac lead wires are seen. The heart size and mediastinal contours are within normal limits. Tortuosity of the descending thoracic aorta is seen. Both lungs are clear. The visualized skeletal structures are unremarkable. IMPRESSION: 1. No acute or active cardiopulmonary disease. Electronically Signed   By: Virgina Norfolk M.D.   On: 08/26/2020 16:09    EKG: Independently reviewed.  Irregular sinus rhythm, right axis deviation.  No significant ST changes.  Assessment/Plan Active Problems:   AKI (acute kidney injury) ([place])   AKI.  No recent records but his creatinine was less than 1 in 2016 during his prior admission with stroke and MRSA bacteremia.  He was told recently by a provider in [place] that his kidneys are not working well.  No documentation. Found to have BUN of 107 and creatinine of 2.92 today. Patient appears dry and received 2 L in ED without any urinary output. -Obtain bladder scan-shows more than 500 cc, patient has no urge to pee. Neurogenic bladder/obstructive uropathy are the possibilities. -Put Foley catheter and keep strict intake and output. -Obtain renal ultrasound. -Check UA. -Start him on Flomax. -Gentle IVF hydration. -Monitor renal function. -Avoid nephrotoxins.  Failure to thrive.  Patient with gradually worsening functional status over the.  Of few months.  Poor p.o. intake.  Noncompliant with his  medications. Appears emaciated.  Patient have some underlying dementia according to daughter-in-law. -PT/OT evaluation. -Continue Aricept.  Type 2 diabetes mellitus.  CBG elevated above 300.  Patient was not taking any meds for about the past few years.  Recently restarted on Metformin and Trulicity but does not take it very regularly. -Check A1c. -SSI  Hypertension.  Blood pressure was elevated.  Patient was recently placed on lisinopril and HCTZ.  No meds for the past 2 days. -Hold lisinopril and HCTZ for AKI. -As needed hydralazine for systolic above 161.  Hypothyroidism.  Synthroid was  listed in his home meds. -Check TSH. -Continue home dose of Synthroid for now.   DVT prophylaxis: Heparin Code Status: Full code, when asked about the CODE STATUS patient becomes very tearful stating that he does not want to die. Family Communication: Daughter-in-law was updated at bedside. Disposition Plan: To be determined. Consults called: None Admission status: Inpatient   Lorella Nimrod MD [facility]  If 7PM-7AM, please contact night-coverage [email or web address]  09/05/2020, 6:23 PM   This record has been created using Dragon voice recognition software. Errors have been sought and corrected,but may not always be located. Such creation errors do not reflect on the standard of care.

## 2020-09-10 NOTE — ED Triage Notes (Signed)
Pt via EMS from home. Wife called out for sick person, pt hasn't been himself lately and pt is more lethargic per EMS. On arrival, pt is alert and oriented to self and place. Pt does have a hx of dementia. Per EMS, pt's pupils were constricted, EMS gave 1mg  of Narcan but no real change in pt's mental status. Pt only states he has soreness in his L leg. Pt has no other complaints at this time. Per EMS, pt's capillary refill is delayed.

## 2020-09-11 ENCOUNTER — Inpatient Hospital Stay: Payer: Medicare Other

## 2020-09-11 LAB — URINE CULTURE

## 2020-09-11 LAB — CBC
HCT: 47.1 % (ref 39.0–52.0)
Hemoglobin: 16.4 g/dL (ref 13.0–17.0)
MCH: 29.7 pg (ref 26.0–34.0)
MCHC: 34.8 g/dL (ref 30.0–36.0)
MCV: 85.2 fL (ref 80.0–100.0)
Platelets: 118 10*3/uL — ABNORMAL LOW (ref 150–400)
RBC: 5.53 MIL/uL (ref 4.22–5.81)
RDW: 13.1 % (ref 11.5–15.5)
WBC: 7.9 10*3/uL (ref 4.0–10.5)
nRBC: 0 % (ref 0.0–0.2)

## 2020-09-11 LAB — BASIC METABOLIC PANEL
Anion gap: 14 (ref 5–15)
BUN: 84 mg/dL — ABNORMAL HIGH (ref 8–23)
CO2: 27 mmol/L (ref 22–32)
Calcium: 8.6 mg/dL — ABNORMAL LOW (ref 8.9–10.3)
Chloride: 100 mmol/L (ref 98–111)
Creatinine, Ser: 2.05 mg/dL — ABNORMAL HIGH (ref 0.61–1.24)
GFR calc Af Amer: 36 mL/min — ABNORMAL LOW (ref >60)
GFR calc non Af Amer: 31 mL/min — ABNORMAL LOW (ref >60)
Glucose, Bld: 166 mg/dL — ABNORMAL HIGH (ref 70–99)
Potassium: 4.6 mmol/L (ref 3.5–5.1)
Sodium: 141 mmol/L (ref 135–145)

## 2020-09-11 LAB — GLUCOSE, CAPILLARY
Glucose-Capillary: 169 mg/dL — ABNORMAL HIGH (ref 70–99)
Glucose-Capillary: 203 mg/dL — ABNORMAL HIGH (ref 70–99)
Glucose-Capillary: 184 mg/dL — ABNORMAL HIGH (ref 70–99)
Glucose-Capillary: 166 mg/dL — ABNORMAL HIGH (ref 70–99)

## 2020-09-11 LAB — HEMOGLOBIN A1C
Hgb A1c MFr Bld: 9.1 % — ABNORMAL HIGH (ref 4.8–5.6)
Mean Plasma Glucose: 214.47 mg/dL

## 2020-09-11 LAB — PSA: Prostatic Specific Antigen: 7.74 ng/mL — ABNORMAL HIGH (ref 0.00–4.00)

## 2020-09-11 MED ORDER — INSULIN GLARGINE 100 UNIT/ML ~~LOC~~ SOLN
10.0000 [IU] | Freq: Every day | SUBCUTANEOUS | Status: DC
  Filled 2020-09-11 (×2): qty 0.1

## 2020-09-11 MED ORDER — SODIUM CHLORIDE 0.9 % IV SOLN
1.0000 g | INTRAVENOUS | Status: DC
  Administered 2020-09-11 – 2020-09-12 (×2): 1 g via INTRAVENOUS
  Filled 2020-09-11: qty 1
  Filled 2020-09-11 (×2): qty 10

## 2020-09-11 MED ORDER — IOHEXOL 9 MG/ML PO SOLN
500.0000 mL | Freq: Once | ORAL | Status: DC | PRN
  Filled 2020-09-11: qty 500

## 2020-09-11 MED ORDER — CHLORHEXIDINE GLUCONATE CLOTH 2 % EX PADS
6.0000 | MEDICATED_PAD | Freq: Every day | CUTANEOUS | Status: DC
  Administered 2020-09-11 – 2020-09-12 (×2): 6 via TOPICAL

## 2020-09-11 NOTE — ED Notes (Signed)
ED TO INPATIENT HANDOFF REPORT  ED Nurse Name and Phone #: Orpha Bur [phone]  S Name/Age/Gender Jacob White 74 y.o. male Room/Bed: ED32A/ED32A  Code Status   Code Status: Full Code  Home/SNF/Other Home Patient oriented to: self Is this baseline? No   Triage Complete: Triage complete  Chief Complaint AKI (acute kidney injury) (HCC) [N17.9]  Triage Note Pt via EMS from home. Wife called out for sick person, pt hasn't been himself lately and pt is more lethargic per EMS. On arrival, pt is alert and oriented to self and place. Pt does have a hx of dementia. Per EMS, pt's pupils were constricted, EMS gave 1mg  of Narcan but no real change in pt's mental status. Pt only states he has soreness in his L leg. Pt has no other complaints at this time. Per EMS, pt's capillary refill is delayed.     Allergies No Known Allergies  Level of Care/Admitting Diagnosis ED Disposition    ED Disposition Condition Comment   Admit  Hospital Area: [facility] [[id number]]  Level of Care: Med-Surg [16]  Covid Evaluation: Confirmed COVID Negative  Diagnosis: AKI (acute kidney injury) [facility]) [facility]  Admitting Physician: [[id number]] Arnetha Courser  Attending Physician: [[id number]] Arnetha Courser  Estimated length of stay: past midnight tomorrow  Certification:: I certify this patient will need inpatient services for at least 2 midnights       B Medical/Surgery History Past Medical History:  Diagnosis Date  . Diabetes mellitus without complication (HCC)   . Hyperlipemia   . Hypertension    Past Surgical History:  Procedure Laterality Date  . TEE WITHOUT CARDIOVERSION N/A 06/12/2015   Procedure: TRANSESOPHAGEAL ECHOCARDIOGRAM (TEE);  Surgeon: 06/14/2015, MD;  Location: [facility] ENDOSCOPY;  Service: Cardiovascular;  Laterality: N/A;     A IV Location/Drains/Wounds Patient Lines/Drains/Airways Status    Active Line/Drains/Airways    Name Placement date Placement time Site Days    Peripheral IV 09/25/20 Right Antecubital 09-25-20  1503  Antecubital  1   Peripheral IV Sep 25, 2020 Right Forearm 09-25-20  1534  Forearm  1   Urethral Catheter 09/12/20, RN Straight-tip 16 Fr. September 25, 2020  1832  Straight-tip  1          Intake/Output Last 24 hours  Intake/Output Summary (Last 24 hours) at 09/11/2020 1943 Last data filed at 09/11/2020 1009 Gross per 24 hour  Intake --  Output 1050 ml  Net -1050 ml    Labs/Imaging Results for orders placed or performed during the hospital encounter of 2020-09-25 (from the past 48 hour(s))  Comprehensive metabolic panel     Status: Abnormal   Collection Time: 09/25/2020  2:57 PM  Result Value Ref Range   Sodium 138 135 - 145 mmol/L   Potassium 4.3 3.5 - 5.1 mmol/L   Chloride 97 (L) 98 - 111 mmol/L   CO2 26 22 - 32 mmol/L   Glucose, Bld 341 (H) 70 - 99 mg/dL    Comment: Glucose reference range applies only to samples taken after fasting for at least 8 hours.   BUN 107 (H) 8 - 23 mg/dL    Comment: RESULT CONFIRMED BY MANUAL DILUTION MJU   Creatinine, Ser 2.92 (H) 0.61 - 1.24 mg/dL   Calcium 8.8 (L) 8.9 - 10.3 mg/dL   Total Protein 7.2 6.5 - 8.1 g/dL   Albumin 3.3 (L) 3.5 - 5.0 g/dL   AST 39 15 - 41 U/L   ALT 22 0 - 44 U/L   Alkaline Phosphatase  94 38 - 126 U/L   Total Bilirubin 1.2 0.3 - 1.2 mg/dL   GFR calc non Af Amer 20 (L) >60 mL/min   GFR calc Af Amer 23 (L) >60 mL/min   Anion gap 15 5 - 15    Comment: Performed at [facility], [address] [place], [place] [address]  CBC     Status: Abnormal   Collection Time: 08/21/2020  2:57 PM  Result Value Ref Range   WBC 8.4 4.0 - 10.5 K/uL   RBC 5.86 (H) 4.22 - 5.81 MIL/uL   Hemoglobin 17.0 13.0 - 17.0 g/dL   HCT 81.1 39 - 52 %   MCV 87.0 80.0 - 100.0 fL   MCH 29.0 26.0 - 34.0 pg   MCHC 33.3 30.0 - 36.0 g/dL   RDW 91.4 78.2 - 95.6 %   Platelets 152 150 - 400 K/uL   nRBC 0.0 0.0 - 0.2 %    Comment: Performed at [facility], [address] [place], [place]  [address]  APTT     Status: None   Collection Time: 09/07/2020  2:57 PM  Result Value Ref Range   aPTT 29 24 - 36 seconds    Comment: Performed at [facility], [address] [place], [place] [address]  Lactic acid, plasma     Status: Abnormal   Collection Time: 09/03/2020  2:57 PM  Result Value Ref Range   Lactic Acid, Venous <0.3 (L) 0.5 - 1.9 mmol/L    Comment: Performed at [facility], [address] [place], [place] [address]  Protime-INR     Status: None   Collection Time: 08/15/2020  2:57 PM  Result Value Ref Range   Prothrombin Time 13.3 11.4 - 15.2 seconds   INR 1.1 0.8 - 1.2    Comment: (NOTE) INR goal varies based on device and disease states. Performed at [facility], [address] [place], [place] [address]   Brain natriuretic peptide     Status: None   Collection Time: 09/02/2020  2:57 PM  Result Value Ref Range   B Natriuretic Peptide 41.7 0.0 - 100.0 pg/mL    Comment: Performed at [facility], [address] [place], [place] [address]  Glucose, capillary     Status: Abnormal   Collection Time: 09/03/2020  3:25 PM  Result Value Ref Range   Glucose-Capillary 302 (H) 70 - 99 mg/dL    Comment: Glucose reference range applies only to samples taken after fasting for at least 8 hours.  Respiratory Panel by RT PCR (Flu A&B, Covid) - Nasopharyngeal Swab     Status: None   Collection Time: 08/30/2020  3:29 PM   Specimen: Nasopharyngeal Swab  Result Value Ref Range   SARS Coronavirus 2 by RT PCR NEGATIVE NEGATIVE    Comment: (NOTE) SARS-CoV-2 target nucleic acids are NOT DETECTED.  The SARS-CoV-2 RNA is generally detectable in upper respiratoy specimens during the acute phase of infection. The lowest concentration of SARS-CoV-2 viral copies this assay can detect is 131 copies/mL. A negative result does not preclude SARS-Cov-2 infection and should not be used as the sole basis for treatment or other patient management decisions. A  negative result may occur with  improper specimen collection/handling, submission of specimen other than nasopharyngeal swab, presence of viral mutation(s) within the areas targeted by this assay, and inadequate number of viral copies (<131 copies/mL). A negative result must be combined with clinical observations, patient history, and epidemiological information.  The expected result is Negative.  Fact Sheet for Patients:  https://www.moore.com/  Fact Sheet for Healthcare Providers:  https://www.young.biz/  This test is no t yet approved or cleared by the [place] FDA and  has been authorized for detection and/or diagnosis of SARS-CoV-2 by FDA under an Emergency Use Authorization (EUA). This EUA will remain  in effect (meaning this test can be used) for the duration of the COVID-19 declaration under Section 564(b)(1) of the Act, 21 U.S.C. section 360bbb-3(b)(1), unless the authorization is terminated or revoked sooner.     Influenza A by PCR NEGATIVE NEGATIVE   Influenza B by PCR NEGATIVE NEGATIVE    Comment: (NOTE) The Xpert Xpress SARS-CoV-2/FLU/RSV assay is intended as an aid in  the diagnosis of influenza from Nasopharyngeal swab specimens and  should not be used as a sole basis for treatment. Nasal washings and  aspirates are unacceptable for Xpert Xpress SARS-CoV-2/FLU/RSV  testing.  Fact Sheet for Patients: https://www.moore.com/  Fact Sheet for Healthcare Providers: https://www.young.biz/  This test is not yet approved or cleared by the [place] FDA and  has been authorized for detection and/or diagnosis of SARS-CoV-2 by  FDA under an Emergency Use Authorization (EUA). This EUA will remain  in effect (meaning this test can be used) for the duration of the  Covid-19 declaration under Section 564(b)(1) of the Act, 21  U.S.C. section 360bbb-3(b)(1), unless the authorization is   terminated or revoked. Performed at [facility], [address] [place], [place] [address]   Blood culture (routine single)     Status: None (Preliminary result)   Collection Time: 08/29/2020  3:29 PM   Specimen: BLOOD  Result Value Ref Range   Specimen Description BLOOD BLOOD LEFT FOREARM    Special Requests      BOTTLES DRAWN AEROBIC AND ANAEROBIC Blood Culture results may not be optimal due to an inadequate volume of blood received in culture bottles   Culture      NO GROWTH < 24 HOURS Performed at [facility], [address] [place], [place] [address]    Report Status PENDING   Urinalysis, Complete w Microscopic Urine, Catheterized     Status: Abnormal   Collection Time: 08/30/2020  6:26 PM  Result Value Ref Range   Color, Urine YELLOW (A) YELLOW   APPearance CLOUDY (A) CLEAR   Specific Gravity, Urine 1.016 1.005 - 1.030   pH 5.0 5.0 - 8.0   Glucose, UA >=500 (A) NEGATIVE mg/dL   Hgb urine dipstick LARGE (A) NEGATIVE   Bilirubin Urine NEGATIVE NEGATIVE   Ketones, ur 5 (A) NEGATIVE mg/dL   Protein, ur 30 (A) NEGATIVE mg/dL   Nitrite NEGATIVE NEGATIVE   Leukocytes,Ua MODERATE (A) NEGATIVE   RBC / HPF 21-50 0 - 5 RBC/hpf   WBC, UA 21-50 0 - 5 WBC/hpf   Bacteria, UA MANY (A) NONE SEEN   Squamous Epithelial / LPF 0-5 0 - 5   WBC Clumps PRESENT    Mucus PRESENT    Budding Yeast PRESENT     Comment: Performed at [facility], [address] [place], [place] [address]  TSH     Status: None   Collection Time: 09/08/2020  6:57 PM  Result Value Ref Range   TSH 1.257 0.350 - 4.500 uIU/mL    Comment: Performed by a 3rd Generation assay with a functional sensitivity of <=0.01 uIU/mL. Performed at [facility], [address] [place], [place] [address]   Hemoglobin A1c  Status: Abnormal   Collection Time: 09/30/2020  6:57 PM  Result Value Ref Range   Hgb A1c MFr Bld 9.1 (H) 4.8 - 5.6 %    Comment: (NOTE) Pre diabetes:           5.7%-6.4%  Diabetes:              >6.4%  Glycemic control for   <7.0% adults with diabetes    Mean Plasma Glucose 214.47 mg/dL    Comment: Performed at [facility], [address], [place], [place] [address]  PSA     Status: Abnormal   Collection Time: 09/30/20  6:57 PM  Result Value Ref Range   Prostatic Specific Antigen 7.74 (H) 0.00 - 4.00 ng/mL    Comment: (NOTE) While PSA levels of <=4.0 ng/ml are reported as reference range, some men with levels below 4.0 ng/ml can have prostate cancer and many men with PSA above 4.0 ng/ml do not have prostate cancer.  Other tests such as free PSA, age specific reference ranges, PSA velocity and PSA doubling time may be helpful especially in men less than 60 years old. Performed at [facility], [address], [place], [place] [address]   Glucose, capillary     Status: Abnormal   Collection Time: Sep 30, 2020  7:00 PM  Result Value Ref Range   Glucose-Capillary 261 (H) 70 - 99 mg/dL    Comment: Glucose reference range applies only to samples taken after fasting for at least 8 hours.  Glucose, capillary     Status: Abnormal   Collection Time: 30-Sep-2020  9:39 PM  Result Value Ref Range   Glucose-Capillary 184 (H) 70 - 99 mg/dL    Comment: Glucose reference range applies only to samples taken after fasting for at least 8 hours.  Basic metabolic panel     Status: Abnormal   Collection Time: 09/11/20  5:13 AM  Result Value Ref Range   Sodium 141 135 - 145 mmol/L   Potassium 4.6 3.5 - 5.1 mmol/L    Comment: HEMOLYSIS AT THIS LEVEL MAY AFFECT RESULT   Chloride 100 98 - 111 mmol/L   CO2 27 22 - 32 mmol/L   Glucose, Bld 166 (H) 70 - 99 mg/dL    Comment: Glucose reference range applies only to samples taken after fasting for at least 8 hours.   BUN 84 (H) 8 - 23 mg/dL   Creatinine, Ser 8.41 (H) 0.61 - 1.24 mg/dL   Calcium 8.6 (L) 8.9 - 10.3 mg/dL   GFR calc non Af Amer 31 (L) >60 mL/min   GFR calc Af Amer 36 (L) >60 mL/min   Anion  gap 14 5 - 15    Comment: Performed at [facility], [address] [place], [place] [address]  CBC     Status: Abnormal   Collection Time: 09/11/20  5:13 AM  Result Value Ref Range   WBC 7.9 4.0 - 10.5 K/uL   RBC 5.53 4.22 - 5.81 MIL/uL   Hemoglobin 16.4 13.0 - 17.0 g/dL   HCT 10.2 39 - 52 %   MCV 85.2 80.0 - 100.0 fL   MCH 29.7 26.0 - 34.0 pg   MCHC 34.8 30.0 - 36.0 g/dL   RDW 72.5 36.6 - 44.0 %   Platelets 118 (L) 150 - 400 K/uL    Comment: Immature Platelet Fraction may be clinically indicated, consider ordering this additional test [id number]    nRBC 0.0 0.0 - 0.2 %  Comment: Performed at [facility], [address] [place], [place] [address]  Glucose, capillary     Status: Abnormal   Collection Time: 09/11/20  9:44 AM  Result Value Ref Range   Glucose-Capillary 169 (H) 70 - 99 mg/dL    Comment: Glucose reference range applies only to samples taken after fasting for at least 8 hours.  Glucose, capillary     Status: Abnormal   Collection Time: 09/11/20  3:04 PM  Result Value Ref Range   Glucose-Capillary 203 (H) 70 - 99 mg/dL    Comment: Glucose reference range applies only to samples taken after fasting for at least 8 hours.  Glucose, capillary     Status: Abnormal   Collection Time: 09/11/20  6:13 PM  Result Value Ref Range   Glucose-Capillary 184 (H) 70 - 99 mg/dL    Comment: Glucose reference range applies only to samples taken after fasting for at least 8 hours.   CT ABDOMEN PELVIS WO CONTRAST  Result Date: 09/11/2020 CLINICAL DATA:  Unintended weight loss EXAM: CT CHEST, ABDOMEN AND PELVIS WITHOUT CONTRAST TECHNIQUE: Multidetector CT imaging of the chest, abdomen and pelvis was performed following the standard protocol without IV contrast. COMPARISON:  01/03/2013, chest x-ray from 10/06/2020 FINDINGS: CT CHEST FINDINGS Cardiovascular: Diffuse atherosclerotic calcifications are noted in the thoracic aorta. No definitive aneurysmal dilatation is  noted. No cardiac enlargement is seen. Coronary calcifications are noted. No pericardial effusion is seen. Mediastinum/Nodes: Thoracic inlet is within normal limits. No hilar or mediastinal adenopathy is noted. The esophagus as visualized is within normal limits. Lungs/Pleura: Lungs are well aerated bilaterally. Mild emphysematous changes are noted. No focal confluent infiltrate or sizable effusion is seen. No parenchymal nodule is noted. Musculoskeletal: Degenerative changes of the thoracic spine are seen. No acute bony abnormality is noted. CT ABDOMEN PELVIS FINDINGS Hepatobiliary: Dependent gallstones are noted. No other focal abnormality is noted or gallbladder. Pancreas: Fatty interdigitation of the pancreas is noted. Spleen: Normal in size without focal abnormality. Adrenals/Urinary Tract: Adrenal glands are within normal limits. Kidneys demonstrate no mass lesion or hydronephrosis. No renal calculi are seen. Bladder is well distended although a Foley catheter is noted in place. Air is noted likely related to the recent instrumentation. Stomach/Bowel: Diverticular change of the colon is noted without evidence of diverticulitis. No definitive mass lesion or obstructive change is seen. The appendix is within normal limits. No small bowel or gastric abnormality is seen. Vascular/Lymphatic: Aortic atherosclerosis. No enlarged abdominal or pelvic lymph nodes. Reproductive: Prostate is unremarkable. Other: No abdominal wall hernia or abnormality. No abdominopelvic ascites. Musculoskeletal: No acute or significant osseous findings. IMPRESSION: CT of the chest: No acute abnormality noted. Aortic Atherosclerosis (ICD10-I70.0) and Emphysema (ICD10-J43.9). CT of the abdomen and pelvis: Cholelithiasis without complicating factors. Diverticulosis without diverticulitis. Electronically Signed   By: Alcide Clever M.D.   On: 09/11/2020 14:40   CT CHEST WO CONTRAST  Result Date: 09/11/2020 CLINICAL DATA:  Unintended weight  loss EXAM: CT CHEST, ABDOMEN AND PELVIS WITHOUT CONTRAST TECHNIQUE: Multidetector CT imaging of the chest, abdomen and pelvis was performed following the standard protocol without IV contrast. COMPARISON:  01/03/2013, chest x-ray from 2020/10/06 FINDINGS: CT CHEST FINDINGS Cardiovascular: Diffuse atherosclerotic calcifications are noted in the thoracic aorta. No definitive aneurysmal dilatation is noted. No cardiac enlargement is seen. Coronary calcifications are noted. No pericardial effusion is seen. Mediastinum/Nodes: Thoracic inlet is within normal limits. No hilar or mediastinal adenopathy is noted. The esophagus as visualized is within normal limits. Lungs/Pleura:  Lungs are well aerated bilaterally. Mild emphysematous changes are noted. No focal confluent infiltrate or sizable effusion is seen. No parenchymal nodule is noted. Musculoskeletal: Degenerative changes of the thoracic spine are seen. No acute bony abnormality is noted. CT ABDOMEN PELVIS FINDINGS Hepatobiliary: Dependent gallstones are noted. No other focal abnormality is noted or gallbladder. Pancreas: Fatty interdigitation of the pancreas is noted. Spleen: Normal in size without focal abnormality. Adrenals/Urinary Tract: Adrenal glands are within normal limits. Kidneys demonstrate no mass lesion or hydronephrosis. No renal calculi are seen. Bladder is well distended although a Foley catheter is noted in place. Air is noted likely related to the recent instrumentation. Stomach/Bowel: Diverticular change of the colon is noted without evidence of diverticulitis. No definitive mass lesion or obstructive change is seen. The appendix is within normal limits. No small bowel or gastric abnormality is seen. Vascular/Lymphatic: Aortic atherosclerosis. No enlarged abdominal or pelvic lymph nodes. Reproductive: Prostate is unremarkable. Other: No abdominal wall hernia or abnormality. No abdominopelvic ascites. Musculoskeletal: No acute or significant osseous  findings. IMPRESSION: CT of the chest: No acute abnormality noted. Aortic Atherosclerosis (ICD10-I70.0) and Emphysema (ICD10-J43.9). CT of the abdomen and pelvis: Cholelithiasis without complicating factors. Diverticulosis without diverticulitis. Electronically Signed   By: Alcide CleverMark  Lukens M.D.   On: 09/11/2020 14:40   US RENAL  Result Date: 08-28-20 CLINICAL DATA:  Acute kidney injury EXAM: RENAL / URINARY TRACT ULTRASOUND COMPLETE COMPARISON:  None. FINDINGS: Right Kidney: Renal measurements: 10.4 x 5.2 x 4.7 cm = volume: 134 mL. Hyperechoic. No mass or hydronephrosis visualized. Left Kidney: Renal measurements: 10.0 x 5.1 x 5.0 cm = volume: 135 mL. Hyperechoic. No mass or hydronephrosis visualized. Bladder: Decompressed by Foley catheter Other: None. IMPRESSION: Hyperechoic kidneys, consistent with medical renal disease. No other abnormality. Electronically Signed   By: Deatra RobinsonKevin  Herman M.D.   On: 009-14-21 19:12   DG Chest Port 1 View  Result Date: 08-28-20 CLINICAL DATA:  Lethargy and altered mental status. EXAM: PORTABLE CHEST 1 VIEW COMPARISON:  June 09, 2015 FINDINGS: Multiple overlying radiopaque cardiac lead wires are seen. The heart size and mediastinal contours are within normal limits. Tortuosity of the descending thoracic aorta is seen. Both lungs are clear. The visualized skeletal structures are unremarkable. IMPRESSION: 1. No acute or active cardiopulmonary disease. Electronically Signed   By: Aram Candelahaddeus  Houston M.D.   On: 009-14-21 16:09    Pending Labs Unresulted Labs (From admission, onward)          Start     Ordered   09/12/20 0500  CBC  Tomorrow morning,   STAT        09/11/20 1353   09/12/20 0500  Renal function panel  Tomorrow morning,   STAT        09/11/20 1353   09/11/20 1517  ANA w/Reflex if Positive  Once,   STAT        09/11/20 1516   09/11/20 1517  ANCA TITERS  Once,   STAT        09/11/20 1516   09/11/20 1517  C3 complement  Once,   STAT        09/11/20 1516    09/11/20 1517  C4 complement  Once,   STAT        09/11/20 1516   09/11/20 1517  Protein electrophoresis, serum  Once,   STAT        09/11/20 1516   09/11/20 1517  Parathyroid hormone, intact (no Ca)  Once,   STAT  09/11/20 1516   09/11/20 1517  Kappa/lambda light chains  Once,   STAT        09/11/20 1516   08/25/2020 1512  Urine culture  (Undifferentiated presentation (screening labs and basic nursing orders))  ONCE - STAT,   STAT        09/06/2020 1512          Vitals/Pain Today's Vitals   09/04/2020 2030 08/31/2020 2100 08/16/2020 2304 09/11/20 0748  BP: 136/87 135/90 132/83 134/87  Pulse: 69 68 72 65  Resp: 16 15 16 18   Temp:      TempSrc:      SpO2: 99% 100% 98% 99%  Weight:      Height:      PainSc:        Isolation Precautions No active isolations  Medications Medications  heparin injection 5,000 Units (5,000 Units Subcutaneous Given 09/11/20 1430)  lactated ringers infusion ( Intravenous New Bag/Given 09/11/20 1503)  acetaminophen (TYLENOL) tablet 650 mg (has no administration in time range)    Or  acetaminophen (TYLENOL) suppository 650 mg (has no administration in time range)  polyethylene glycol (MIRALAX / GLYCOLAX) packet 17 g (has no administration in time range)  ondansetron (ZOFRAN) tablet 4 mg (has no administration in time range)    Or  ondansetron (ZOFRAN) injection 4 mg (has no administration in time range)  insulin aspart (novoLOG) injection 0-9 Units (0 Units Subcutaneous Not Given 09/11/20 1800)  insulin aspart (novoLOG) injection 0-5 Units (0 Units Subcutaneous Not Given 08/31/2020 2146)  donepezil (ARICEPT) tablet 10 mg (10 mg Oral Given 09/11/2020 2247)  levothyroxine (SYNTHROID) tablet 25 mcg (25 mcg Oral Not Given 09/11/20 0730)  pravastatin (PRAVACHOL) tablet 40 mg (40 mg Oral Given 08/24/2020 2250)  hydrALAZINE (APRESOLINE) tablet 25 mg (has no administration in time range)  tamsulosin (FLOMAX) capsule 0.4 mg (0.4 mg Oral Given 09/04/2020 1948)  cefTRIAXone  (ROCEPHIN) 1 g in sodium chloride 0.9 % 100 mL IVPB (0 g Intravenous Stopped 09/11/20 1100)  insulin glargine (LANTUS) injection 10 Units (has no administration in time range)  iohexol (OMNIPAQUE) 9 MG/ML oral solution 500 mL (has no administration in time range)  lactated ringers bolus 1,000 mL (0 mLs Intravenous Stopped 09/07/2020 1718)  lactated ringers bolus 1,000 mL (0 mLs Intravenous Stopped 08/30/2020 1810)    Mobility non-ambulatory High fall risk     R Recommendations: See Admitting Provider Note  Report given to:   Additional Notes: Somnolent and decreased PO intake today

## 2020-09-11 NOTE — ED Notes (Signed)
Attempted to feed patient and give him some medications patient had a hard time swallowing MD notified

## 2020-09-11 NOTE — ED Notes (Signed)
Son Clide Cliff notified of bed assignment [id number]

## 2020-09-11 NOTE — Consult Note (Signed)
[address] [place], [place] [address] Phone [phone]. Fax [phone]  Date: 09/11/2020                  Patient Name:  Jacob White  MRN: [id number]  DOB: [birth year]  Age / Sex: 74 y.o., male         PCP: Patient, No Pcp Per                 Service Requesting Consult: IM/ Lorella Nimrod, MD                 Reason for Consult: ARF            History of Present Illness: Patient is a 74 y.o. male with medical problems of diabetes and hypertension, who was admitted to [facility] on 09/07/2020 for evaluation of generalized weakness and altered mental status.  Patient is very lethargic and unable to provide any meaningful information.  All information is obtained from the chart and his son, Jacob White. His son reports that up to about a month ago patient was fairly active and was working at [employer] from 2:30 AM to 6 AM.  He fell 1 day and since then his health has been going downhill.  He was restarted on his diabetic medications which he has not been taking for 2 years. Progressively patient has gotten worse. He now needs help with activities of daily living such as walking to bathroom.  Appetite has been poor.  Patient has also lost significant amount of weight.  He was brought in by EMS for further evaluation.  Medications: Outpatient medications: (Not in a hospital admission)   Current medications: Current Facility-Administered Medications  Medication Dose Route Frequency Provider Last Rate Last Admin  . acetaminophen (TYLENOL) tablet 650 mg  650 mg Oral Q6H PRN Lorella Nimrod, MD       Or  . acetaminophen (TYLENOL) suppository 650 mg  650 mg Rectal Q6H PRN Lorella Nimrod, MD      . cefTRIAXone (ROCEPHIN) 1 g in sodium chloride 0.9 % 100 mL IVPB  1 g Intravenous Q24H Lorella Nimrod, MD   Stopped at 09/11/20 1100  . donepezil (ARICEPT) tablet 10 mg  10 mg Oral QHS Lorella Nimrod, MD   10 mg at 08/24/2020 2247  . heparin injection 5,000 Units  5,000 Units Subcutaneous Q8H  Lorella Nimrod, MD   5,000 Units at 08/27/2020 2254  . hydrALAZINE (APRESOLINE) tablet 25 mg  25 mg Oral Q8H PRN Lorella Nimrod, MD      . insulin aspart (novoLOG) injection 0-5 Units  0-5 Units Subcutaneous QHS Amin, Sumayya, MD      . insulin aspart (novoLOG) injection 0-9 Units  0-9 Units Subcutaneous TID WC Lorella Nimrod, MD   5 Units at 08/22/2020 1948  . insulin glargine (LANTUS) injection 10 Units  10 Units Subcutaneous QHS Amin, Soundra Pilon, MD      . iohexol (OMNIPAQUE) 9 MG/ML oral solution 500 mL  500 mL Oral Once PRN Lorella Nimrod, MD      . lactated ringers infusion   Intravenous Continuous Lorella Nimrod, MD 100 mL/hr at 08/24/2020 1947 New Bag at 08/17/2020 1947  . levothyroxine (SYNTHROID) tablet 25 mcg  25 mcg Oral Daily Lorella Nimrod, MD      . ondansetron (ZOFRAN) tablet 4 mg  4 mg Oral Q6H PRN Lorella Nimrod, MD       Or  . ondansetron (ZOFRAN) injection 4 mg  4 mg Intravenous Q6H PRN Amin,  Sumayya, MD      . polyethylene glycol (MIRALAX / GLYCOLAX) packet 17 g  17 g Oral Daily PRN Lorella Nimrod, MD      . pravastatin (PRAVACHOL) tablet 40 mg  40 mg Oral QHS Lorella Nimrod, MD   40 mg at 08/28/2020 2250  . tamsulosin (FLOMAX) capsule 0.4 mg  0.4 mg Oral QPC supper Lorella Nimrod, MD   0.4 mg at 08/16/2020 1948   Current Outpatient Medications  Medication Sig Dispense Refill  . donepezil (ARICEPT) 10 MG tablet Take 10 mg by mouth daily.    . Dulaglutide (TRULICITY) 6.60 YT/0.1SW SOPN Inject 0.75 mg into the skin every Tuesday.    Arna Medici 25 MCG tablet Take 25 mcg by mouth daily.    Marland Kitchen lisinopril-hydrochlorothiazide (ZESTORETIC) 20-25 MG tablet Take 1 tablet by mouth daily.    . metFORMIN (GLUCOPHAGE) 1000 MG tablet Take 1,000 mg by mouth 2 (two) times daily with a meal.    . pravastatin (PRAVACHOL) 40 MG tablet Take 40 mg by mouth daily.    Marland Kitchen aspirin EC 325 MG EC tablet Take 1 tablet (325 mg total) by mouth daily. (Patient not taking: Reported on 09/08/2020) 30 tablet 0  . blood glucose meter  kit and supplies KIT Dispense based on patient and insurance preference. Use up to four times daily as directed. (FOR ICD-9 250.00, 250.01). 1 each 0  . Insulin Glargine (LANTUS SOLOSTAR) 100 UNIT/ML Solostar Pen Inject 12 Units into the skin daily with breakfast. (Patient not taking: Reported on 08/27/2020) 15 mL 11  . Insulin Pen Needle (CAREFINE PEN NEEDLES) 32G X 5 MM MISC 1 applicator by Does not apply route daily with breakfast. 100 each 1  . lisinopril (PRINIVIL,ZESTRIL) 20 MG tablet Take 1 tablet (20 mg total) by mouth daily after supper. (Patient not taking: Reported on 08/15/2020) 30 tablet 1  . Vancomycin (VANCOCIN) 750 MG/150ML SOLN Inject 150 mLs (750 mg total) into the vein every 12 (twelve) hours. (Patient not taking: Reported on 09/08/2020) 150 mL 8      Allergies: No Known Allergies    Past Medical History: Past Medical History:  Diagnosis Date  . Diabetes mellitus without complication ([place])   . Hyperlipemia   . Hypertension      Past Surgical History: Past Surgical History:  Procedure Laterality Date  . TEE WITHOUT CARDIOVERSION N/A 06/12/2015   Procedure: TRANSESOPHAGEAL ECHOCARDIOGRAM (TEE);  Surgeon: Pixie Casino, MD;  Location: [facility] ENDOSCOPY;  Service: Cardiovascular;  Laterality: N/A;     Family History: History reviewed. No pertinent family history.   Social History: Social History   Socioeconomic History  . Marital status: Married    Spouse name: Not on file  . Number of children: Not on file  . Years of education: Not on file  . Highest education level: Not on file  Occupational History  . Not on file  Tobacco Use  . Smoking status: Never Smoker  Substance and Sexual Activity  . Alcohol use: No  . Drug use: Not on file  . Sexual activity: Not on file  Other Topics Concern  . Not on file  Social History Narrative  . Not on file   Social Determinants of Health   Financial Resource Strain:   . Difficulty of Paying Living Expenses: Not on  file  Food Insecurity:   . Worried About Charity fundraiser in the Last Year: Not on file  . Ran Out of Food in the Last Year: Not  on file  Transportation Needs:   . Lack of Transportation (Medical): Not on file  . Lack of Transportation (Non-Medical): Not on file  Physical Activity:   . Days of Exercise per Week: Not on file  . Minutes of Exercise per Session: Not on file  Stress:   . Feeling of Stress : Not on file  Social Connections:   . Frequency of Communication with Friends and Family: Not on file  . Frequency of Social Gatherings with Friends and Family: Not on file  . Attends Religious Services: Not on file  . Active Member of Clubs or Organizations: Not on file  . Attends Archivist Meetings: Not on file  . Marital Status: Not on file  Intimate Partner Violence:   . Fear of Current or Ex-Partner: Not on file  . Emotionally Abused: Not on file  . Physically Abused: Not on file  . Sexually Abused: Not on file     Review of Systems: Not available as patient is lethargic and poorly responsive Gen:  HEENT:  CV:  Resp:  GI: GU :  MS:  Derm:    Psych: Heme:  Neuro:  Endocrine  Vital Signs: Blood pressure 134/87, pulse 65, temperature 97.9 F (36.6 C), temperature source Oral, resp. rate 18, height _0  (1.676 m), weight 54.4 kg, SpO2 99 %.   Intake/Output Summary (Last 24 hours) at 09/11/2020 1458 Last data filed at 09/11/2020 1009 Gross per 24 hour  Intake 2000 ml  Output 1050 ml  Net 950 ml    Weight trends: [employer]   09/04/2020 1454  Weight: 54.4 kg    Physical Exam: General:  Chronically ill-appearing, thin, cachectic  HEENT  dry oral mucous membranes anicteric  Neck:  No mass  Lungs:  Room air, normal breathing effort, no crackles  Heart::  No rub or gallop  Abdomen:  Emaciated, soft, nontender  Extremities:  No peripheral edema  Neurologic:  Lethargic, tries to open eyes on voice commands but will not interact or answer any  questions  Skin:  Decreased turgor, no acute rashes  Foley:  In place       Lab results: Basic Metabolic Panel: Recent Labs  Lab 09/01/2020 1457 09/11/20 0513  NA 138 141  K 4.3 4.6  CL 97* 100  CO2 26 27  GLUCOSE 341* 166*  BUN 107* 84*  CREATININE 2.92* 2.05*  CALCIUM 8.8* 8.6*    Liver Function Tests: Recent Labs  Lab 08/16/2020 1457  AST 39  ALT 22  ALKPHOS 94  BILITOT 1.2  PROT 7.2  ALBUMIN 3.3*   No results for input(s): LIPASE, AMYLASE in the last 168 hours. No results for input(s): AMMONIA in the last 168 hours.  CBC: Recent Labs  Lab 08/31/2020 1457 09/11/20 0513  WBC 8.4 7.9  HGB 17.0 16.4  HCT 51.0 47.1  MCV 87.0 85.2  PLT 152 118*    Cardiac Enzymes: No results for input(s): CKTOTAL, TROPONINI in the last 168 hours.  BNP: Invalid input(s): POCBNP  CBG: Recent Labs  Lab 08/29/2020 1525 09/11/2020 1900 08/29/2020 2139 09/11/20 0944  GLUCAP 302* 261* 184* 169*    Microbiology: Recent Results (from the past 720 hour(s))  Respiratory Panel by RT PCR (Flu A&B, Covid) - Nasopharyngeal Swab     Status: None   Collection Time: 09/11/2020  3:29 PM   Specimen: Nasopharyngeal Swab  Result Value Ref Range Status   SARS Coronavirus 2 by RT PCR NEGATIVE NEGATIVE Final  Comment: (NOTE) SARS-CoV-2 target nucleic acids are NOT DETECTED.  The SARS-CoV-2 RNA is generally detectable in upper respiratoy specimens during the acute phase of infection. The lowest concentration of SARS-CoV-2 viral copies this assay can detect is 131 copies/mL. A negative result does not preclude SARS-Cov-2 infection and should not be used as the sole basis for treatment or other patient management decisions. A negative result may occur with  improper specimen collection/handling, submission of specimen other than nasopharyngeal swab, presence of viral mutation(s) within the areas targeted by this assay, and inadequate number of viral copies (<131 copies/mL). A negative result  must be combined with clinical observations, patient history, and epidemiological information. The expected result is Negative.  Fact Sheet for Patients:  PinkCheek.be  Fact Sheet for Healthcare Providers:  GravelBags.it  This test is no t yet approved or cleared by the [place] FDA and  has been authorized for detection and/or diagnosis of SARS-CoV-2 by FDA under an Emergency Use Authorization (EUA). This EUA will remain  in effect (meaning this test can be used) for the duration of the COVID-19 declaration under Section 564(b)(1) of the Act, 21 U.S.C. section 360bbb-3(b)(1), unless the authorization is terminated or revoked sooner.     Influenza A by PCR NEGATIVE NEGATIVE Final   Influenza B by PCR NEGATIVE NEGATIVE Final    Comment: (NOTE) The Xpert Xpress SARS-CoV-2/FLU/RSV assay is intended as an aid in  the diagnosis of influenza from Nasopharyngeal swab specimens and  should not be used as a sole basis for treatment. Nasal washings and  aspirates are unacceptable for Xpert Xpress SARS-CoV-2/FLU/RSV  testing.  Fact Sheet for Patients: PinkCheek.be  Fact Sheet for Healthcare Providers: GravelBags.it  This test is not yet approved or cleared by the [place] FDA and  has been authorized for detection and/or diagnosis of SARS-CoV-2 by  FDA under an Emergency Use Authorization (EUA). This EUA will remain  in effect (meaning this test can be used) for the duration of the  Covid-19 declaration under Section 564(b)(1) of the Act, 21  U.S.C. section 360bbb-3(b)(1), unless the authorization is  terminated or revoked. Performed at [facility], [address] [place], [place] [address]   Blood culture (routine single)     Status: None (Preliminary result)   Collection Time: 08/18/2020  3:29 PM   Specimen: BLOOD  Result Value Ref Range Status    Specimen Description BLOOD BLOOD LEFT FOREARM  Final   Special Requests   Final    BOTTLES DRAWN AEROBIC AND ANAEROBIC Blood Culture results may not be optimal due to an inadequate volume of blood received in culture bottles   Culture   Final    NO GROWTH < 24 HOURS Performed at [facility], [address], [place], [place] [address]    Report Status PENDING  Incomplete     Coagulation Studies: Recent Labs    08/22/2020 1457  LABPROT 13.3  INR 1.1    Urinalysis: Recent Labs    09/09/2020 1826  COLORURINE YELLOW*  LABSPEC 1.016  PHURINE 5.0  GLUCOSEU >=500*  HGBUR LARGE*  BILIRUBINUR NEGATIVE  KETONESUR 5*  PROTEINUR 30*  NITRITE NEGATIVE  LEUKOCYTESUR MODERATE*        Imaging: CT ABDOMEN PELVIS WO CONTRAST  Result Date: 09/11/2020 CLINICAL DATA:  Unintended weight loss EXAM: CT CHEST, ABDOMEN AND PELVIS WITHOUT CONTRAST TECHNIQUE: Multidetector CT imaging of the chest, abdomen and pelvis was performed following the standard protocol without IV contrast. COMPARISON:  01/03/2013, chest  x-ray from 08/22/2020 FINDINGS: CT CHEST FINDINGS Cardiovascular: Diffuse atherosclerotic calcifications are noted in the thoracic aorta. No definitive aneurysmal dilatation is noted. No cardiac enlargement is seen. Coronary calcifications are noted. No pericardial effusion is seen. Mediastinum/Nodes: Thoracic inlet is within normal limits. No hilar or mediastinal adenopathy is noted. The esophagus as visualized is within normal limits. Lungs/Pleura: Lungs are well aerated bilaterally. Mild emphysematous changes are noted. No focal confluent infiltrate or sizable effusion is seen. No parenchymal nodule is noted. Musculoskeletal: Degenerative changes of the thoracic spine are seen. No acute bony abnormality is noted. CT ABDOMEN PELVIS FINDINGS Hepatobiliary: Dependent gallstones are noted. No other focal abnormality is noted or gallbladder. Pancreas: Fatty interdigitation of the pancreas is  noted. Spleen: Normal in size without focal abnormality. Adrenals/Urinary Tract: Adrenal glands are within normal limits. Kidneys demonstrate no mass lesion or hydronephrosis. No renal calculi are seen. Bladder is well distended although a Foley catheter is noted in place. Air is noted likely related to the recent instrumentation. Stomach/Bowel: Diverticular change of the colon is noted without evidence of diverticulitis. No definitive mass lesion or obstructive change is seen. The appendix is within normal limits. No small bowel or gastric abnormality is seen. Vascular/Lymphatic: Aortic atherosclerosis. No enlarged abdominal or pelvic lymph nodes. Reproductive: Prostate is unremarkable. Other: No abdominal wall hernia or abnormality. No abdominopelvic ascites. Musculoskeletal: No acute or significant osseous findings. IMPRESSION: CT of the chest: No acute abnormality noted. Aortic Atherosclerosis (ICD10-I70.0) and Emphysema (ICD10-J43.9). CT of the abdomen and pelvis: Cholelithiasis without complicating factors. Diverticulosis without diverticulitis. Electronically Signed   By: Inez Catalina M.D.   On: 09/11/2020 14:40   CT CHEST WO CONTRAST  Result Date: 09/11/2020 CLINICAL DATA:  Unintended weight loss EXAM: CT CHEST, ABDOMEN AND PELVIS WITHOUT CONTRAST TECHNIQUE: Multidetector CT imaging of the chest, abdomen and pelvis was performed following the standard protocol without IV contrast. COMPARISON:  01/03/2013, chest x-ray from 09/05/2020 FINDINGS: CT CHEST FINDINGS Cardiovascular: Diffuse atherosclerotic calcifications are noted in the thoracic aorta. No definitive aneurysmal dilatation is noted. No cardiac enlargement is seen. Coronary calcifications are noted. No pericardial effusion is seen. Mediastinum/Nodes: Thoracic inlet is within normal limits. No hilar or mediastinal adenopathy is noted. The esophagus as visualized is within normal limits. Lungs/Pleura: Lungs are well aerated bilaterally. Mild  emphysematous changes are noted. No focal confluent infiltrate or sizable effusion is seen. No parenchymal nodule is noted. Musculoskeletal: Degenerative changes of the thoracic spine are seen. No acute bony abnormality is noted. CT ABDOMEN PELVIS FINDINGS Hepatobiliary: Dependent gallstones are noted. No other focal abnormality is noted or gallbladder. Pancreas: Fatty interdigitation of the pancreas is noted. Spleen: Normal in size without focal abnormality. Adrenals/Urinary Tract: Adrenal glands are within normal limits. Kidneys demonstrate no mass lesion or hydronephrosis. No renal calculi are seen. Bladder is well distended although a Foley catheter is noted in place. Air is noted likely related to the recent instrumentation. Stomach/Bowel: Diverticular change of the colon is noted without evidence of diverticulitis. No definitive mass lesion or obstructive change is seen. The appendix is within normal limits. No small bowel or gastric abnormality is seen. Vascular/Lymphatic: Aortic atherosclerosis. No enlarged abdominal or pelvic lymph nodes. Reproductive: Prostate is unremarkable. Other: No abdominal wall hernia or abnormality. No abdominopelvic ascites. Musculoskeletal: No acute or significant osseous findings. IMPRESSION: CT of the chest: No acute abnormality noted. Aortic Atherosclerosis (ICD10-I70.0) and Emphysema (ICD10-J43.9). CT of the abdomen and pelvis: Cholelithiasis without complicating factors. Diverticulosis without diverticulitis. Electronically Signed   By:  Inez Catalina M.D.   On: 09/11/2020 14:40   US RENAL  Result Date: 09/02/2020 CLINICAL DATA:  Acute kidney injury EXAM: RENAL / URINARY TRACT ULTRASOUND COMPLETE COMPARISON:  None. FINDINGS: Right Kidney: Renal measurements: 10.4 x 5.2 x 4.7 cm = volume: 134 mL. Hyperechoic. No mass or hydronephrosis visualized. Left Kidney: Renal measurements: 10.0 x 5.1 x 5.0 cm = volume: 135 mL. Hyperechoic. No mass or hydronephrosis visualized.  Bladder: Decompressed by Foley catheter Other: None. IMPRESSION: Hyperechoic kidneys, consistent with medical renal disease. No other abnormality. Electronically Signed   By: Ulyses Jarred M.D.   On: 08/20/2020 19:12   DG Chest Port 1 View  Result Date: 08/17/2020 CLINICAL DATA:  Lethargy and altered mental status. EXAM: PORTABLE CHEST 1 VIEW COMPARISON:  June 09, 2015 FINDINGS: Multiple overlying radiopaque cardiac lead wires are seen. The heart size and mediastinal contours are within normal limits. Tortuosity of the descending thoracic aorta is seen. Both lungs are clear. The visualized skeletal structures are unremarkable. IMPRESSION: 1. No acute or active cardiopulmonary disease. Electronically Signed   By: Virgina Norfolk M.D.   On: 09/11/2020 16:09      Assessment & Plan: Pt is a 74 y.o.   male with diabetes, hypertension, atherosclerosis, mild emphysema was admitted on 08/23/2020 with AKI (acute kidney injury) ([place]) [N17.9]   #Acute kidney injury Baseline creatinine of 0.76, GFR greater than 60 from July 2016  Admission/presenting creatinine of 2.92 which is starting to improve with IV hydration.  Renal ultrasound shows hyperechoic kidneys, no other abnormality. Urinalysis with greater than 500 glucose, large hemoglobin, ketones present, 21-50 RBCs, 21-50 WBCs  Plan: Continue IV hydration Urine output has improved significantly and serum creatinine has started to improve with volume replacement. We will obtain screening serologies, urine P/C ratio Hold ACE inhibitor, Metformin Continue to monitor  #Hematuria Microscopic hematuria is noted His son reports that patient has complained of gross hematuria in the past CT of the chest and abdomen without contrast is negative for any bladder mass May need outpatient urology evaluation for hematuria and urinary retention  #Diabetes, uncontrolled Lab Results  Component Value Date   HGBA1C 9.1 (H) 08/21/2020  Will probably need less  aggressive outpatient diabetic regimen since he just re-started taking his medications      LOS: 1 Willadene Mounsey 9/28/20212:58 PM    Note: This note was prepared with Dragon dictation. Any transcription errors are unintentional

## 2020-09-11 NOTE — ED Notes (Signed)
Attempted to give report to floor, they will call back when they have assigned patient to a nurse

## 2020-09-11 NOTE — Progress Notes (Addendum)
PROGRESS NOTE    Jacob White  VEL:[id number] DOB: Jul 01, 1946 DOA: 09/08/2020 PCP: Patient, No Pcp Per   Brief Narrative:   Jacob White is a 74 y.o. male with medical history significant of type 2 diabetes mellitus, hypertension and hyperlipidemia was brought to ED via EMS after family called that he is sick. Patient also has some underlying dementia.  Daughter-in-law was present at the time of interview in the room.  According to her patient he is not doing well for the past couple of months.  Gradually declining, poor p.o. intake.  He needs assistance with most of his ADLs.  Until few days ago he was able to walk with some help in the past 3 days he was not walking either.  Per patient he was feeling overall weak with some pain in his legs for the past month. Patient stopped seeing any physicians for few years.  Recently they took him to a physician in [place] due to gradual decline in his health and poor p.o. intake.  According to daughter-in-law they told them that patient has some gallstones and kidneys are not working well.  No records or documentation found on epic.  Renal functions were normal 5 years ago.  Patient stopped taking his diabetic medication and blood pressure medications.  Recently restarted but refusing most of the time.  He was placed on Trulicity and Metformin along with lisinopril and HCTZ by that provider.  Subjective: Patient was feeling very lethargic and refusing meals when seen today.  Son was at bedside.  According to him he is losing weight for the past couple of month.  Refusing much p.o. intake.  Assessment & Plan:   Active Problems:   AKI (acute kidney injury) (HCC)  AKI.  No recent records but his creatinine was less than 1 in 2016 during his prior admission with stroke and MRSA bacteremia.  He was told recently by a provider in [place] that his kidneys are not working well.  No documentation. Found to have BUN of 107 and creatinine of 2.92 on admission,  which improved to 2.05 and BUN of 84 today.  Mildly elevated PSA at 7.74. Renal ultrasound with concern of chronic medical disease. Foley catheter was placed yesterday with concern of obstructive uropathy, no documentation about output.  Good amount of urine in the bag. UA looks with pyuria.  Urine cultures pending. Urology was consulted for obstructive uropathy-they are advising treating infection and outpatient follow-up in 1 week for voiding trials in the clinic. Nephrology was also consulted-concern of diabetic nephropathy. -Start him on ceftriaxone-we will de-escalate once culture results are available. -Continue gentle IV hydration. -Continue Flomax. -Strict intake and output. -Monitor renal function. -Avoid nephrotoxins.  Failure to thrive/weight loss/underlying dementia.  Patient has not been followed up with physicians in years.  No screening done.  Noncompliant with his medications. -We will obtained CT chest, abdomen and pelvis without contrast. -PT/OT evaluation. -Continue Aricept -Palliative care consult to discuss goals of care.  Type 2 diabetes mellitus.  CBG was elevated on admission, A1c of 9.1. Patient was started on sliding scale. -Add Lantus 10 units at bedtime. -Continue with SSI.  Hypertension.  Blood pressure within goal today. Home dose of lisinopril and HCTZ was held due to AKI. -Continue to monitor. -Continue hydralazine as needed.  Hypothyroidism.  Synthroid was listed in his home meds. TSH on admission was 1.257. -Continue Synthroid.  Objective: Vitals:   09/07/2020 2030 08/31/2020 2100 09/11/2020 2304 09/11/20 0748  BP: 136/87  135/90 132/83 134/87  Pulse: 69 68 72 65  Resp: 16 15 16 18   Temp:      TempSrc:      SpO2: 99% 100% 98% 99%  Weight:      Height:        Intake/Output Summary (Last 24 hours) at 09/11/2020 1331 Last data filed at 09-29-2020 1810 Gross per 24 hour  Intake 2000 ml  Output --  Net 2000 ml   Filed Weights   09-29-2020 1454   Weight: 54.4 kg    Examination:  General exam: Frail, emaciated elderly man ,appears calm and comfortable  Respiratory system: Clear to auscultation. Respiratory effort normal. Cardiovascular system: S1 & S2 heard, RRR. No JVD, murmurs, Gastrointestinal system: Soft, nontender, nondistended, bowel sounds positive. Central nervous system: Alert and oriented. No focal neurological deficits. Extremities: No edema, no cyanosis, pulses intact and symmetrical. Psychiatry: Judgement and insight appear impaired.   DVT prophylaxis: Heparin Code Status: Full Family Communication: Son was updated at bedside. Disposition Plan:  Status is: Inpatient  Remains inpatient appropriate because:Inpatient level of care appropriate due to severity of illness   Dispo: The patient is from: Home              Anticipated d/c is to: To be determined              Anticipated d/c date is: 2 days              Patient currently is not medically stable to d/c.  Patient is very high risk for deterioration and death.  Very emaciated patient with extensive weight loss, refusing p.o. intake.  Palliative was consulted.  Consultants:   Nephrology  Urology  Palliative care.  Procedures:  Antimicrobials:  Ceftriaxone  Data Reviewed: I have personally reviewed following labs and imaging studies  CBC: Recent Labs  Lab September 29, 2020 1457 09/11/20 0513  WBC 8.4 7.9  HGB 17.0 16.4  HCT 51.0 47.1  MCV 87.0 85.2  PLT 152 118*   Basic Metabolic Panel: Recent Labs  Lab September 29, 2020 1457 09/11/20 0513  NA 138 141  K 4.3 4.6  CL 97* 100  CO2 26 27  GLUCOSE 341* 166*  BUN 107* 84*  CREATININE 2.92* 2.05*  CALCIUM 8.8* 8.6*   GFR: Estimated Creatinine Clearance: 24 mL/min (A) (by C-G formula based on SCr of 2.05 mg/dL (H)). Liver Function Tests: Recent Labs  Lab 09-29-2020 1457  AST 39  ALT 22  ALKPHOS 94  BILITOT 1.2  PROT 7.2  ALBUMIN 3.3*   No results for input(s): LIPASE, AMYLASE in the last  168 hours. No results for input(s): AMMONIA in the last 168 hours. Coagulation Profile: Recent Labs  Lab 29-Sep-2020 1457  INR 1.1   Cardiac Enzymes: No results for input(s): CKTOTAL, CKMB, CKMBINDEX, TROPONINI in the last 168 hours. BNP (last 3 results) No results for input(s): PROBNP in the last 8760 hours. HbA1C: Recent Labs    2020/09/29 1857  HGBA1C 9.1*   CBG: Recent Labs  Lab 09/29/2020 1525 09/29/2020 1900 09/29/20 2139 09/11/20 0944  GLUCAP 302* 261* 184* 169*   Lipid Profile: No results for input(s): CHOL, HDL, LDLCALC, TRIG, CHOLHDL, LDLDIRECT in the last 72 hours. Thyroid Function Tests: Recent Labs    2020/09/29 1857  TSH 1.257   Anemia Panel: No results for input(s): VITAMINB12, FOLATE, FERRITIN, TIBC, IRON, RETICCTPCT in the last 72 hours. Sepsis Labs: Recent Labs  Lab 29-Sep-2020 1457  LATICACIDVEN <0.3*    Recent Results (from  the past 240 hour(s))  Respiratory Panel by RT PCR (Flu A&B, Covid) - Nasopharyngeal Swab     Status: None   Collection Time: 09/08/2020  3:29 PM   Specimen: Nasopharyngeal Swab  Result Value Ref Range Status   SARS Coronavirus 2 by RT PCR NEGATIVE NEGATIVE Final    Comment: (NOTE) SARS-CoV-2 target nucleic acids are NOT DETECTED.  The SARS-CoV-2 RNA is generally detectable in upper respiratoy specimens during the acute phase of infection. The lowest concentration of SARS-CoV-2 viral copies this assay can detect is 131 copies/mL. A negative result does not preclude SARS-Cov-2 infection and should not be used as the sole basis for treatment or other patient management decisions. A negative result may occur with  improper specimen collection/handling, submission of specimen other than nasopharyngeal swab, presence of viral mutation(s) within the areas targeted by this assay, and inadequate number of viral copies (<131 copies/mL). A negative result must be combined with clinical observations, patient history, and epidemiological  information. The expected result is Negative.  Fact Sheet for Patients:  https://www.moore.com/  Fact Sheet for Healthcare Providers:  https://www.young.biz/  This test is no t yet approved or cleared by the [place] FDA and  has been authorized for detection and/or diagnosis of SARS-CoV-2 by FDA under an Emergency Use Authorization (EUA). This EUA will remain  in effect (meaning this test can be used) for the duration of the COVID-19 declaration under Section 564(b)(1) of the Act, 21 U.S.C. section 360bbb-3(b)(1), unless the authorization is terminated or revoked sooner.     Influenza A by PCR NEGATIVE NEGATIVE Final   Influenza B by PCR NEGATIVE NEGATIVE Final    Comment: (NOTE) The Xpert Xpress SARS-CoV-2/FLU/RSV assay is intended as an aid in  the diagnosis of influenza from Nasopharyngeal swab specimens and  should not be used as a sole basis for treatment. Nasal washings and  aspirates are unacceptable for Xpert Xpress SARS-CoV-2/FLU/RSV  testing.  Fact Sheet for Patients: https://www.moore.com/  Fact Sheet for Healthcare Providers: https://www.young.biz/  This test is not yet approved or cleared by the [place] FDA and  has been authorized for detection and/or diagnosis of SARS-CoV-2 by  FDA under an Emergency Use Authorization (EUA). This EUA will remain  in effect (meaning this test can be used) for the duration of the  Covid-19 declaration under Section 564(b)(1) of the Act, 21  U.S.C. section 360bbb-3(b)(1), unless the authorization is  terminated or revoked. Performed at [facility], [address] [place], [place] [address]   Blood culture (routine single)     Status: None (Preliminary result)   Collection Time: 09/11/2020  3:29 PM   Specimen: BLOOD  Result Value Ref Range Status   Specimen Description BLOOD BLOOD LEFT FOREARM  Final   Special Requests   Final      BOTTLES DRAWN AEROBIC AND ANAEROBIC Blood Culture results may not be optimal due to an inadequate volume of blood received in culture bottles   Culture   Final    NO GROWTH < 24 HOURS Performed at [facility], [address], [place], [place] [address]    Report Status PENDING  Incomplete     Radiology Studies: US RENAL  Result Date: 09/09/2020 CLINICAL DATA:  Acute kidney injury EXAM: RENAL / URINARY TRACT ULTRASOUND COMPLETE COMPARISON:  None. FINDINGS: Right Kidney: Renal measurements: 10.4 x 5.2 x 4.7 cm = volume: 134 mL. Hyperechoic. No mass or hydronephrosis visualized. Left Kidney: Renal measurements: 10.0 x 5.1 x 5.0  cm = volume: 135 mL. Hyperechoic. No mass or hydronephrosis visualized. Bladder: Decompressed by Foley catheter Other: None. IMPRESSION: Hyperechoic kidneys, consistent with medical renal disease. No other abnormality. Electronically Signed   By: Deatra RobinsonKevin  Herman M.D.   On: Feb 07, 2020 19:12   DG Chest Port 1 View  Result Date: 01/06/2020 CLINICAL DATA:  Lethargy and altered mental status. EXAM: PORTABLE CHEST 1 VIEW COMPARISON:  June 09, 2015 FINDINGS: Multiple overlying radiopaque cardiac lead wires are seen. The heart size and mediastinal contours are within normal limits. Tortuosity of the descending thoracic aorta is seen. Both lungs are clear. The visualized skeletal structures are unremarkable. IMPRESSION: 1. No acute or active cardiopulmonary disease. Electronically Signed   By: Aram Candelahaddeus  Houston M.D.   On: Feb 07, 2020 16:09    Scheduled Meds: . donepezil  10 mg Oral QHS  . heparin  5,000 Units Subcutaneous Q8H  . insulin aspart  0-5 Units Subcutaneous QHS  . insulin aspart  0-9 Units Subcutaneous TID WC  . insulin glargine  10 Units Subcutaneous QHS  . levothyroxine  25 mcg Oral Daily  . pravastatin  40 mg Oral QHS  . tamsulosin  0.4 mg Oral QPC supper   Continuous Infusions: . cefTRIAXone (ROCEPHIN)  IV 1 g (09/11/20 1009)  . lactated ringers 100  mL/hr at 11-07-2020 1947     LOS: 1 day   Time spent: 35 minutes.  Arnetha CourserSumayya Camp Gopal, MD [facility]  If 7PM-7AM, please contact night-coverage [email or web address]  09/11/2020, 1:31 PM   This record has been created using Conservation officer, historic buildingsDragon voice recognition software. Errors have been sought and corrected,but may not always be located. Such creation errors do not reflect on the standard of care.

## 2020-09-11 NOTE — Progress Notes (Signed)
OT Cancellation Note  Patient Details Name: Jacob White MRN: [id number] DOB: 08-Sep-1946   Cancelled Treatment:    Reason Eval/Treat Not Completed: Fatigue/lethargy limiting ability to participate. Consult received, chart reviewed. Spoke with PT and RN. Pt lethargic, unable to participate in therapy at this time. Will re-attempt at later date/time as medically appropriate.   Richrd Prime, MPH, MS, OTR/L ascom [phone] 09/11/20, 12:25 PM

## 2020-09-11 NOTE — Progress Notes (Addendum)
Inpatient Diabetes Program Recommendations  AACE/ADA: New Consensus Statement on Inpatient Glycemic Control (2015)  Target Ranges:  Prepandial:   less than 140 mg/dL      Peak postprandial:   less than 180 mg/dL (1-2 hours)      Critically ill patients:  140 - 180 mg/dL   Lab Results  Component Value Date   GLUCAP 184 (H) 11-Sep-2020   HGBA1C 9.1 (H) 2020/09/11    Review of Glycemic Control Results for RALPHEAL, ZAPPONE (MRN [id number]) as of 09/11/2020 09:41  Ref. Range 09/11/20 15:25 2020-09-11 19:00 September 11, 2020 21:39  Glucose-Capillary Latest Ref Range: 70 - 99 mg/dL 671 (H) 245 (H) 809 (H)   Diabetes history: DM 2 Outpatient Diabetes medications: Trulicity 0.75 weekly, Metformin 1000 mg bid Current orders for Inpatient glycemic control:  Novolog sensitive tid with meals and HS Inpatient Diabetes Program Recommendations:   Please consider adding Lantus 10 units daily.   Thanks,  Beryl Meager, RN, BC-ADM Inpatient Diabetes Coordinator Pager [phone] (8a-5p)

## 2020-09-11 NOTE — Progress Notes (Signed)
PT Cancellation Note  Patient Details Name: Jacob White MRN: [id number] DOB: 07-11-1946   Cancelled Treatment:    Reason Eval/Treat Not Completed:  (Consult received and chart reviewed. Patient sleeping soundly upon arrival to session; unable to awaken, maintain alertness for participation with evaluation.  Will continue efforts at later time/date as appropriate.)  Of note, son at bedside and able to provide some history.  Patient lives at home with spouse in single-level mobile home, 4-5 steps to enter.  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  In recent weeks, reports patient has experienced drastic functional decline, now requiring physical assist +1-2 to transfer or mobilize for limited household distance.  Does endorse recent falls in previous month; unable to fully quantify (at least 1-2).   Jacob White, PT, DPT, NCS 09/11/20, 11:44 AM [phone]

## 2020-09-11 NOTE — ED Notes (Signed)
Patient is Strict Intake and Output

## 2020-09-12 ENCOUNTER — Inpatient Hospital Stay: Payer: Medicare Other

## 2020-09-12 DIAGNOSIS — I10 Essential (primary) hypertension: Secondary | ICD-10-CM

## 2020-09-12 DIAGNOSIS — F0390 Unspecified dementia without behavioral disturbance: Secondary | ICD-10-CM

## 2020-09-12 DIAGNOSIS — E43 Unspecified severe protein-calorie malnutrition: Secondary | ICD-10-CM

## 2020-09-12 DIAGNOSIS — Z66 Do not resuscitate: Secondary | ICD-10-CM

## 2020-09-12 DIAGNOSIS — Z515 Encounter for palliative care: Secondary | ICD-10-CM

## 2020-09-12 DIAGNOSIS — R627 Adult failure to thrive: Secondary | ICD-10-CM

## 2020-09-12 DIAGNOSIS — Z7189 Other specified counseling: Secondary | ICD-10-CM

## 2020-09-12 DIAGNOSIS — G9341 Metabolic encephalopathy: Secondary | ICD-10-CM

## 2020-09-12 LAB — HEPATIC FUNCTION PANEL
ALT: 55 U/L — ABNORMAL HIGH (ref 0–44)
AST: 109 U/L — ABNORMAL HIGH (ref 15–41)
Albumin: 2.9 g/dL — ABNORMAL LOW (ref 3.5–5.0)
Alkaline Phosphatase: 80 U/L (ref 38–126)
Bilirubin, Direct: 0.1 mg/dL (ref 0.0–0.2)
Indirect Bilirubin: 1.6 mg/dL — ABNORMAL HIGH (ref 0.3–0.9)
Total Bilirubin: 1.7 mg/dL — ABNORMAL HIGH (ref 0.3–1.2)
Total Protein: 6.3 g/dL — ABNORMAL LOW (ref 6.5–8.1)

## 2020-09-12 LAB — RENAL FUNCTION PANEL
Albumin: 2.9 g/dL — ABNORMAL LOW (ref 3.5–5.0)
Anion gap: 20 — ABNORMAL HIGH (ref 5–15)
BUN: 56 mg/dL — ABNORMAL HIGH (ref 8–23)
CO2: 19 mmol/L — ABNORMAL LOW (ref 22–32)
Calcium: 8.6 mg/dL — ABNORMAL LOW (ref 8.9–10.3)
Chloride: 104 mmol/L (ref 98–111)
Creatinine, Ser: 1.95 mg/dL — ABNORMAL HIGH (ref 0.61–1.24)
GFR calc Af Amer: 38 mL/min — ABNORMAL LOW (ref >60)
GFR calc non Af Amer: 33 mL/min — ABNORMAL LOW (ref >60)
Glucose, Bld: 171 mg/dL — ABNORMAL HIGH (ref 70–99)
Phosphorus: 3.7 mg/dL (ref 2.5–4.6)
Potassium: 4.3 mmol/L (ref 3.5–5.1)
Sodium: 143 mmol/L (ref 135–145)

## 2020-09-12 LAB — CBC
HCT: 46.4 % (ref 39.0–52.0)
Hemoglobin: 16.1 g/dL (ref 13.0–17.0)
MCH: 29.8 pg (ref 26.0–34.0)
MCHC: 34.7 g/dL (ref 30.0–36.0)
MCV: 85.8 fL (ref 80.0–100.0)
Platelets: 121 10*3/uL — ABNORMAL LOW (ref 150–400)
RBC: 5.41 MIL/uL (ref 4.22–5.81)
RDW: 13.2 % (ref 11.5–15.5)
WBC: 8.9 10*3/uL (ref 4.0–10.5)
nRBC: 0 % (ref 0.0–0.2)

## 2020-09-12 LAB — GLUCOSE, CAPILLARY
Glucose-Capillary: 164 mg/dL — ABNORMAL HIGH (ref 70–99)
Glucose-Capillary: 169 mg/dL — ABNORMAL HIGH (ref 70–99)
Glucose-Capillary: 172 mg/dL — ABNORMAL HIGH (ref 70–99)
Glucose-Capillary: 143 mg/dL — ABNORMAL HIGH (ref 70–99)

## 2020-09-12 MED ORDER — FLUCONAZOLE IN SODIUM CHLORIDE 200-0.9 MG/100ML-% IV SOLN
200.0000 mg | INTRAVENOUS | Status: DC
  Administered 2020-09-12: 200 mg via INTRAVENOUS
  Filled 2020-09-12: qty 100

## 2020-09-12 NOTE — Progress Notes (Signed)
PT Cancellation Note  Patient Details Name: Jacob White MRN: [id number] DOB: August 10, 1946   Cancelled Treatment:    Reason Eval/Treat Not Completed: Other (comment) (Pt unable to communicate when PT asked pt his name. Pt unable to follow commands at this time due to increased lethargy, and therefore is not appropriate for PT evaluation at this time. Will follow up for therapy evaluation with time permitting.)  Vira Blanco, PT, DPT 9:07 AM,09/12/20

## 2020-09-12 NOTE — Progress Notes (Signed)
Patient ID: Jacob White, male   DOB: 12-03-1946, 74 y.o.   MRN: [id number] [facility] PROGRESS NOTE  Jacob White [id number] DOB: 1946/05/28 DOA: 08/28/2020 PCP: Patient, No Pcp Per  HPI/Subjective: Patient given a sternal rub and opened up his eyes and went back to sleep.  Patient was able to squeeze my hands bilaterally. Nursing staff nervous about feeding him at this point.  Objective: Vitals:   09/12/20 0510 09/12/20 0753  BP: 120/68 135/84  Pulse: 70 75  Resp: 16 16  Temp: 98.6 F (37 C) 98.4 F (36.9 C)  SpO2: 94% 92%    Intake/Output Summary (Last 24 hours) at 09/12/2020 1347 Last data filed at 09/12/2020 7341 Gross per 24 hour  Intake --  Output 1800 ml  Net -1800 ml   Filed Weights   08/15/2020 1454  Weight: 54.4 kg    ROS: Review of Systems  Unable to perform ROS: Acuity of condition   Exam: Physical Exam HENT:     Head: Normocephalic.     Mouth/Throat:     Pharynx: No oropharyngeal exudate.  Eyes:     General: Lids are normal.     Pupils: Pupils are equal, round, and reactive to light.  Cardiovascular:     Rate and Rhythm: Normal rate and regular rhythm.     Heart sounds: Normal heart sounds, S1 normal and S2 normal.  Pulmonary:     Breath sounds: Examination of the right-lower field reveals decreased breath sounds. Examination of the left-lower field reveals decreased breath sounds. Decreased breath sounds present. No wheezing, rhonchi or rales.  Abdominal:     Palpations: Abdomen is soft.     Tenderness: There is no abdominal tenderness.  Musculoskeletal:     Right ankle: No swelling.     Left ankle: No swelling.  Skin:    General: Skin is warm.     Findings: No rash.  Neurological:     Mental Status: He is lethargic.     Comments: Opens eyes to sternal rub. Able to squeeze my hands bilaterally.       Data Reviewed: Basic Metabolic Panel: Recent Labs  Lab 08/31/2020 1457 09/11/20 0513 09/12/20 0642  NA 138 141 143  K 4.3  4.6 4.3  CL 97* 100 104  CO2 26 27 19*  GLUCOSE 341* 166* 171*  BUN 107* 84* 56*  CREATININE 2.92* 2.05* 1.95*  CALCIUM 8.8* 8.6* 8.6*  PHOS  --   --  3.7   Liver Function Tests: Recent Labs  Lab 09/05/2020 1457 09/12/20 0642  AST 39 109*  ALT 22 55*  ALKPHOS 94 80  BILITOT 1.2 1.7*  PROT 7.2 6.3*  ALBUMIN 3.3* 2.9*  2.9*   CBC: Recent Labs  Lab 08/28/2020 1457 09/11/20 0513 09/12/20 0642  WBC 8.4 7.9 8.9  HGB 17.0 16.4 16.1  HCT 51.0 47.1 46.4  MCV 87.0 85.2 85.8  PLT 152 118* 121*   BNP (last 3 results) Recent Labs    09/04/2020 1457  BNP 41.7    CBG: Recent Labs  Lab 09/11/20 1504 09/11/20 1813 09/11/20 2125 09/12/20 0825 09/12/20 1225  GLUCAP 203* 184* 166* 164* 169*    Recent Results (from the past 240 hour(s))  Respiratory Panel by RT PCR (Flu A&B, Covid) - Nasopharyngeal Swab     Status: None   Collection Time: 08/19/2020  3:29 PM   Specimen: Nasopharyngeal Swab  Result Value Ref Range Status   SARS Coronavirus 2 by RT PCR NEGATIVE  NEGATIVE Final    Comment: (NOTE) SARS-CoV-2 target nucleic acids are NOT DETECTED.  The SARS-CoV-2 RNA is generally detectable in upper respiratoy specimens during the acute phase of infection. The lowest concentration of SARS-CoV-2 viral copies this assay can detect is 131 copies/mL. A negative result does not preclude SARS-Cov-2 infection and should not be used as the sole basis for treatment or other patient management decisions. A negative result may occur with  improper specimen collection/handling, submission of specimen other than nasopharyngeal swab, presence of viral mutation(s) within the areas targeted by this assay, and inadequate number of viral copies (<131 copies/mL). A negative result must be combined with clinical observations, patient history, and epidemiological information. The expected result is Negative.  Fact Sheet for Patients:  https://www.moore.com/https://www.fda.gov/media/142436/download  Fact Sheet for  Healthcare Providers:  https://www.young.biz/https://www.fda.gov/media/142435/download  This test is no t yet approved or cleared by the [place] FDA and  has been authorized for detection and/or diagnosis of SARS-CoV-2 by FDA under an Emergency Use Authorization (EUA). This EUA will remain  in effect (meaning this test can be used) for the duration of the COVID-19 declaration under Section 564(b)(1) of the Act, 21 U.S.C. section 360bbb-3(b)(1), unless the authorization is terminated or revoked sooner.     Influenza A by PCR NEGATIVE NEGATIVE Final   Influenza B by PCR NEGATIVE NEGATIVE Final    Comment: (NOTE) The Xpert Xpress SARS-CoV-2/FLU/RSV assay is intended as an aid in  the diagnosis of influenza from Nasopharyngeal swab specimens and  should not be used as a sole basis for treatment. Nasal washings and  aspirates are unacceptable for Xpert Xpress SARS-CoV-2/FLU/RSV  testing.  Fact Sheet for Patients: https://www.moore.com/https://www.fda.gov/media/142436/download  Fact Sheet for Healthcare Providers: https://www.young.biz/https://www.fda.gov/media/142435/download  This test is not yet approved or cleared by the [place] FDA and  has been authorized for detection and/or diagnosis of SARS-CoV-2 by  FDA under an Emergency Use Authorization (EUA). This EUA will remain  in effect (meaning this test can be used) for the duration of the  Covid-19 declaration under Section 564(b)(1) of the Act, 21  U.S.C. section 360bbb-3(b)(1), unless the authorization is  terminated or revoked. Performed at [facility], [address] [place], [place] [address]   Blood culture (routine single)     Status: None (Preliminary result)   Collection Time: 09/03/2020  3:29 PM   Specimen: BLOOD  Result Value Ref Range Status   Specimen Description BLOOD BLOOD LEFT FOREARM  Final   Special Requests   Final    BOTTLES DRAWN AEROBIC AND ANAEROBIC Blood Culture results may not be optimal due to an inadequate volume of blood received in culture  bottles   Culture   Final    NO GROWTH 2 DAYS Performed at [facility], [address] [place], [place] [address]    Report Status PENDING  Incomplete  Urine culture     Status: Abnormal   Collection Time: 09/06/2020  6:26 PM   Specimen: In/Out Cath Urine  Result Value Ref Range Status   Specimen Description   Final    IN/OUT CATH URINE Performed at [facility], [address] [place], [place] [address]    Special Requests   Final    NONE Performed at [facility], [address] [place], [place] [address]    Culture MULTIPLE SPECIES PRESENT, SUGGEST RECOLLECTION (A)  Final   Report Status 09/11/2020 FINAL  Final     Studies: CT ABDOMEN PELVIS WO CONTRAST  Result  Date: 09/11/2020 CLINICAL DATA:  Unintended weight loss EXAM: CT CHEST, ABDOMEN AND PELVIS WITHOUT CONTRAST TECHNIQUE: Multidetector CT imaging of the chest, abdomen and pelvis was performed following the standard protocol without IV contrast. COMPARISON:  01/03/2013, chest x-ray from Sep 19, 2020 FINDINGS: CT CHEST FINDINGS Cardiovascular: Diffuse atherosclerotic calcifications are noted in the thoracic aorta. No definitive aneurysmal dilatation is noted. No cardiac enlargement is seen. Coronary calcifications are noted. No pericardial effusion is seen. Mediastinum/Nodes: Thoracic inlet is within normal limits. No hilar or mediastinal adenopathy is noted. The esophagus as visualized is within normal limits. Lungs/Pleura: Lungs are well aerated bilaterally. Mild emphysematous changes are noted. No focal confluent infiltrate or sizable effusion is seen. No parenchymal nodule is noted. Musculoskeletal: Degenerative changes of the thoracic spine are seen. No acute bony abnormality is noted. CT ABDOMEN PELVIS FINDINGS Hepatobiliary: Dependent gallstones are noted. No other focal abnormality is noted or gallbladder. Pancreas: Fatty interdigitation of the pancreas is noted. Spleen: Normal in size without  focal abnormality. Adrenals/Urinary Tract: Adrenal glands are within normal limits. Kidneys demonstrate no mass lesion or hydronephrosis. No renal calculi are seen. Bladder is well distended although a Foley catheter is noted in place. Air is noted likely related to the recent instrumentation. Stomach/Bowel: Diverticular change of the colon is noted without evidence of diverticulitis. No definitive mass lesion or obstructive change is seen. The appendix is within normal limits. No small bowel or gastric abnormality is seen. Vascular/Lymphatic: Aortic atherosclerosis. No enlarged abdominal or pelvic lymph nodes. Reproductive: Prostate is unremarkable. Other: No abdominal wall hernia or abnormality. No abdominopelvic ascites. Musculoskeletal: No acute or significant osseous findings. IMPRESSION: CT of the chest: No acute abnormality noted. Aortic Atherosclerosis (ICD10-I70.0) and Emphysema (ICD10-J43.9). CT of the abdomen and pelvis: Cholelithiasis without complicating factors. Diverticulosis without diverticulitis. Electronically Signed   By: Alcide Clever M.D.   On: 09/11/2020 14:40   CT CHEST WO CONTRAST  Result Date: 09/11/2020 CLINICAL DATA:  Unintended weight loss EXAM: CT CHEST, ABDOMEN AND PELVIS WITHOUT CONTRAST TECHNIQUE: Multidetector CT imaging of the chest, abdomen and pelvis was performed following the standard protocol without IV contrast. COMPARISON:  01/03/2013, chest x-ray from 19-Sep-2020 FINDINGS: CT CHEST FINDINGS Cardiovascular: Diffuse atherosclerotic calcifications are noted in the thoracic aorta. No definitive aneurysmal dilatation is noted. No cardiac enlargement is seen. Coronary calcifications are noted. No pericardial effusion is seen. Mediastinum/Nodes: Thoracic inlet is within normal limits. No hilar or mediastinal adenopathy is noted. The esophagus as visualized is within normal limits. Lungs/Pleura: Lungs are well aerated bilaterally. Mild emphysematous changes are noted. No focal  confluent infiltrate or sizable effusion is seen. No parenchymal nodule is noted. Musculoskeletal: Degenerative changes of the thoracic spine are seen. No acute bony abnormality is noted. CT ABDOMEN PELVIS FINDINGS Hepatobiliary: Dependent gallstones are noted. No other focal abnormality is noted or gallbladder. Pancreas: Fatty interdigitation of the pancreas is noted. Spleen: Normal in size without focal abnormality. Adrenals/Urinary Tract: Adrenal glands are within normal limits. Kidneys demonstrate no mass lesion or hydronephrosis. No renal calculi are seen. Bladder is well distended although a Foley catheter is noted in place. Air is noted likely related to the recent instrumentation. Stomach/Bowel: Diverticular change of the colon is noted without evidence of diverticulitis. No definitive mass lesion or obstructive change is seen. The appendix is within normal limits. No small bowel or gastric abnormality is seen. Vascular/Lymphatic: Aortic atherosclerosis. No enlarged abdominal or pelvic lymph nodes. Reproductive: Prostate is unremarkable. Other: No abdominal wall hernia or abnormality. No abdominopelvic ascites. Musculoskeletal:  No acute or significant osseous findings. IMPRESSION: CT of the chest: No acute abnormality noted. Aortic Atherosclerosis (ICD10-I70.0) and Emphysema (ICD10-J43.9). CT of the abdomen and pelvis: Cholelithiasis without complicating factors. Diverticulosis without diverticulitis. Electronically Signed   By: Alcide Clever M.D.   On: 09/11/2020 14:40   US RENAL  Result Date: 09/02/2020 CLINICAL DATA:  Acute kidney injury EXAM: RENAL / URINARY TRACT ULTRASOUND COMPLETE COMPARISON:  None. FINDINGS: Right Kidney: Renal measurements: 10.4 x 5.2 x 4.7 cm = volume: 134 mL. Hyperechoic. No mass or hydronephrosis visualized. Left Kidney: Renal measurements: 10.0 x 5.1 x 5.0 cm = volume: 135 mL. Hyperechoic. No mass or hydronephrosis visualized. Bladder: Decompressed by Foley catheter Other:  None. IMPRESSION: Hyperechoic kidneys, consistent with medical renal disease. No other abnormality. Electronically Signed   By: Deatra Robinson M.D.   On: 09/02/2020 19:12   DG Chest Port 1 View  Result Date: 08/23/2020 CLINICAL DATA:  Lethargy and altered mental status. EXAM: PORTABLE CHEST 1 VIEW COMPARISON:  June 09, 2015 FINDINGS: Multiple overlying radiopaque cardiac lead wires are seen. The heart size and mediastinal contours are within normal limits. Tortuosity of the descending thoracic aorta is seen. Both lungs are clear. The visualized skeletal structures are unremarkable. IMPRESSION: 1. No acute or active cardiopulmonary disease. Electronically Signed   By: Aram Candela M.D.   On: 08/15/2020 16:09    Scheduled Meds: . Chlorhexidine Gluconate Cloth  6 each Topical Daily  . donepezil  10 mg Oral QHS  . heparin  5,000 Units Subcutaneous Q8H  . insulin aspart  0-5 Units Subcutaneous QHS  . insulin aspart  0-9 Units Subcutaneous TID WC  . levothyroxine  25 mcg Oral Daily  . tamsulosin  0.4 mg Oral QPC supper   Continuous Infusions: . cefTRIAXone (ROCEPHIN)  IV 1 g (09/12/20 1120)  . lactated ringers 100 mL/hr at 09/12/20 1117    Assessment/Plan:  1. Acute metabolic encephalopathy.  Spoke with the son at the bedside that everything will key off his mental status.  Need to regain a better mental status in order to eat.  We will get an MRI of the brain.  Palliative care consultation. 2. Acute kidney injury.  Creatinine 2.92 on presentation down to 1.95.  Continue IV fluids.  Hold Metformin and lisinopril HCT. 3. Severe protein calorie malnutrition, failure to thrive.  Patient made NPO with altered mental status.  Speech therapy to follow-up. 4. Suspected UTI urine culture growing out contamination on Rocephin for now. 5. Underlying dementia.  Check B12, RPR. 6. Hypothyroidism unspecified on levothyroxine if able to take.  TSH normal range. 7. Type 2 diabetes mellitus with  hyperlipidemia.  Hemoglobin A1c elevated at 9.1.  Sliding scale only since n.p.o.  Hold statin. 8. Essential hypertension holding lisinopril HCT 9. Elevated liver function test.  Continue to monitor. 10. Slightly elevated PSA which could be indicative of prostate cancer.  We will see if mental status improves prior to further work-up of this. 11. Reviewed CODE STATUS with son at the bedside and still remains full code at this point.     Code Status:     Code Status Orders  (From admission, onward)         Start     Ordered   09/07/2020 1803  Full code  Continuous        08/31/2020 1806        Code Status History    Date Active Date Inactive Code Status Order ID Comments User Context  06/13/2015 1628 06/20/2015 1723 Full Code [id number]  Jacquelynn Cree, PA-C Inpatient   06/13/2015 1628 06/13/2015 1628 Full Code [id number]  Jerene Pitch Inpatient   06/08/2015 2034 06/13/2015 1628 Full Code [id number]  Levie Heritage, DO Inpatient   Advance Care Planning Activity     Family Communication: Spoke with son at the bedside Disposition Plan: Status is: Inpatient  Dispo: The patient is from: Home              Anticipated d/c is to: To be determined based on clinical course              Anticipated d/c date is: We will need more time here with acute metabolic encephalopathy              Patient currently made n.p.o. with acute metabolic encephalopathy.  Consultants:  Nephrology  Palliative care  Speech therapy  Antibiotics:  Rocephin  Time spent: 31 minutes  Jionni [employer]

## 2020-09-12 NOTE — Progress Notes (Signed)
Initial Nutrition Assessment  DOCUMENTATION CODES:   Severe malnutrition in context of chronic illness  INTERVENTION:  If appropriate with goals of care, recommend placing NG tube for nutrition support -Osmolite 1.2 @ 20 ml/hr, advance 10 ml every 8 hrs to goal rate 60 ml/hr (1440 ml/day) Regimen will provide 1728 kcal, 80 grams of protein, and 1181 ml free water  Pt is high refeeding risk, recommend monitoring magnesium, potassium, and phosphorus daily as tube progresses towards goal.   With diet advancement, consider appetite stimulant  NUTRITION DIAGNOSIS:   Severe Malnutrition related to chronic illness as evidenced by energy intake < or equal to 75% for > or equal to 1 month, severe fat depletion, moderate muscle depletion, severe muscle depletion.    GOAL:   Patient will meet greater than or equal to 90% of their needs    MONITOR:   Weight trends, Labs, I & O's, PO intake, Diet advancement  REASON FOR ASSESSMENT:   Consult Assessment of nutrition requirement/status  ASSESSMENT:  74 year old male with history of DM2, HTN, HLD, prior stroke, and dementia presented with progressive decline in functional status over the past few months admitted for AKI and failure to thrive.  Met with pt at bedside, son in room this morning. Untouched breakfast tray in room. Pt did not open his eyes or respond to questions, but appeared aware of RD presence and cooperative during exam. Son reports pt has been lethargic over the last 2 days, endorsed no po intake over the past week and decreased appetite/intake over the past couple of months. Recalled pt as usually having a good appetite, likes to snack all day, typically eats sandwiches and drinks water. Son reports pt with history of alcohol use, stated "he use to drink a long time ago" and unaware of any current alcohol intake. Pt wears dentures, son reports bottom dentures broke approximately 1 week ago.   Per chart, pt has not seen a  physician in a few years and has been noncompliant with medications and  refusing most of the time. No recent wt history for review, last weight prior to admission 59.6 kg (131.12 lb) in 2016. Currently, pt weighs 120 lbs with severe fat/muscle depletions to entire body. He is not awake/alert enough to tolerate po intake at this time and is now NPO. RD discussed placing feeding tube with son to provide short term nutrition, son agreeable and will discuss with family. Recommendations provided above if appropriate with goals of care, palliative consult is pending.    I/Os: -580 ml since admit UOP: 2850 ml x 24 hrs Medications reviewed and include: Aricept, SSI, Lantus 10 units at bedtime, IV Rocephin   Lactated ringers @ 100 ml/hr  Labs: CBGs [id number], BUN 56 (H), Cr 1.95 (H) 9/27 A1c 9.1  NUTRITION - FOCUSED PHYSICAL EXAM:    Most Recent Value  Orbital Region Severe depletion  Upper Arm Region Severe depletion  Thoracic and Lumbar Region Severe depletion  Buccal Region Unable to assess  [suspect severe, pt not wearing bottom dentures and unable to access]  Temple Region Moderate depletion  Clavicle Bone Region Severe depletion  Clavicle and Acromion Bone Region Severe depletion  Scapular Bone Region Unable to assess  Dorsal Hand Severe depletion  Patellar Region Severe depletion  Anterior Thigh Region Severe depletion  Posterior Calf Region Severe depletion  Edema (RD Assessment) None  Hair Reviewed  Eyes Unable to assess  Mouth Unable to assess  Skin Reviewed  Nails Reviewed  Diet Order:   Diet Order            Diet NPO time specified  Diet effective now                 EDUCATION NEEDS:   Education needs have been addressed  Skin:  Skin Assessment: Reviewed RN Assessment  Last BM:  pta  Height:   Ht Readings from Last 1 Encounters:  08/31/2020 5' 6"  (1.676 m)    Weight:   Wt Readings from Last 1 Encounters:  09/04/2020 54.4 kg    BMI:  Body  mass index is 19.37 kg/m.  Estimated Nutritional Needs:   Kcal:  1600-1800  Protein:  70-80  Fluid:  >/= 1.5 L    Lajuan Lines, RD, LDN Clinical Nutrition After Hours/Weekend Pager # in [place]

## 2020-09-12 NOTE — Progress Notes (Addendum)
Occupational Therapy Treatment Patient Details Name: Jacob White MRN: [id number] DOB: [birth year] Today's Date: 09/12/2020    History of present illness Jacob White is a 74 y.o. male with a stated past medical history of leukemia on injectable chemotherapy, type 2 diabetes, and hypertension who presents via EMS after family called with concerns of gradually declining function, generalized weakness, and poor p.o. intake. Imaging significant for cholelithiasis without complicating factors, diverticulosis without diverticulitis, and hyperechoic kidneys consistent with medical renal disease.   OT comments  Pt seen for OT/PT co tx session this date to attempt to progress functional mobility and determine level of assist needed to support functional ADL management and functional mobility. Pt rqequires +2 TOTAL assist to perform sup<>sit t/f. Once seated EOB pt requires variable assist to maintain sitting balance. He is able to briefly maintain sitting balance with CGA for safety sitting EOB. He requires MOD/MAX A to bring a washcloth to his face while seated and +2 mod A to maintain sitting balance. Pt minimally more alert while seated upright at EOB. He fatigues quickly and returns lethargic state after sit>sup t/f. Pt does not verbalize t/o session, and continues to have difficulty following VCs. He continues to benefit from skilled OT services. Will follow POC as written. DC recommendation remains appropriate.     Follow Up Recommendations  Supervision/Assistance - 24 hour;SNF    Equipment Recommendations  3 in 1 bedside commode    Recommendations for Other Services      Precautions / Restrictions Precautions Precautions: Fall Precaution Comments: High fall Restrictions Weight Bearing Restrictions: No       Mobility Bed Mobility Overal bed mobility: Needs Assistance Bed Mobility: Supine to Sit     Supine to sit: Total assist;+2 for physical assistance     General bed  mobility comments: Total assist +2 for trunk/BLE facilitation to sit EOB.  Transfers Overall transfer level: Needs assistance               General transfer comment: Deferred for pt safety.    Balance Overall balance assessment: Needs assistance Sitting-balance support: Bilateral upper extremity supported;Feet supported;Single extremity supported Sitting balance-Leahy Scale: Poor Sitting balance - Comments: Pt initially required total assist to sit EOB. With time, pt able to maintain seated balance with SBA and BUE support. With fatigue, pt demonstrated L lateral lean that he was able to correct momentarily with tactile cues to R obliques. Increased posterior lean noted with decreased BLE support. Postural control: Left lateral lean;Posterior lean     Standing balance comment: deferred                           ADL either performed or assessed with clinical judgement   ADL Overall ADL's : Needs assistance/impaired                                       General ADL Comments: Pt is remains significantly functionally limited by impaired cognition this date. He requires TOTAL A +2 for sup<>sit t/f. He is unsafe to perform functional mobility as he is unable to maintain static sitting balance consistently w/o MOD-MAX A +1.     Vision Baseline Vision/History: Wears glasses Wears Glasses: Reading only Additional Comments: Pt unable to state, eyes generally remain closed, however pt is able to open them slightly at sounds of sons voice and with prompting.  Perception     Praxis      Cognition Arousal/Alertness: Lethargic Behavior During Therapy: Flat affect Overall Cognitive Status: Impaired/Different from baseline Area of Impairment: Following commands                       Following Commands: Follows one step commands inconsistently       General Comments: Pt remains generally non-resposive, but does demo improved alertness once  seated at EOB.        Exercises Other Exercises Other Exercises: Pt able to tolerate ~10 minutes of seated balance at EOB with intermittent assistance of total-SBA. Pt required increased assistance to maintain upright positioning with fatigue. Pt able to wash his face with OT and unilateral UE/Bil LE support, requiring min assist for maintainance of upright posture. Other Exercises: OT/PT facilitated seated EOB activity including attempt at UB grooming (face washing) this date. Pt requires significant increased time/assist to maintain sitting balance and bring washcloth to face.   Shoulder Instructions       General Comments Pt LLE noted to be slightly purple/ruddy from mid-shin through ankle, and foot. It presents cold to the touch. RN notified.    Pertinent Vitals/ Pain       Pain Assessment: Faces Pain Score: 0-No pain Faces Pain Scale: Hurts even more Pain Location: L foot with touch/movement Pain Descriptors / Indicators: Grimacing Pain Intervention(s): Limited activity within patient's tolerance;Monitored during session;Repositioned  Home Living Family/patient expects to be discharged to:: Private residence Living Arrangements: Spouse/significant other;Other (Comment) (and grandchildren) Available Help at Discharge: Available 24 hours/day Type of Home: Mobile home Home Access: Stairs to enter Entrance Stairs-Number of Steps: 4 Entrance Stairs-Rails: Right;Left;Can reach both Home Layout: One level     Bathroom Shower/Tub: Chief Strategy Officer: Standard Bathroom Accessibility: Yes   Home Equipment: Cane - single point;Adaptive equipment Adaptive Equipment: Reacher        Prior Functioning/Environment Level of Independence: Independent        Comments: Per son, pt was walking independently, 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 Had one fall at the restaruant ~1 month ago. Has reported feeling  light headed and dizzy for last month. Family report they've noted him stumbling more in the last month as well. Does not use AE for functional mobility   Frequency  Min 1X/week        Progress Toward Goals  OT Goals(current goals can now be found in the care plan section)  Progress towards OT goals: Progressing toward goals  Acute Rehab OT Goals Patient Stated Goal: Pt unable to state, per son to go home when able. OT Goal Formulation: With patient/family Time For Goal Achievement: 09/26/20 Potential to Achieve Goals: Good ADL Goals Pt Will Perform Grooming: sitting;with min assist (c LRAD PRN for improved safety and functional independence) Pt Will Perform Lower Body Dressing: sit to/from stand;with mod assist (c LRAD PRN for improved safety and functional independence) Pt Will Transfer to Toilet: bedside commode;ambulating;with min assist (c LRAD PRN for improved safety and functional independence)  Plan Discharge plan remains appropriate;Frequency remains appropriate    Co-evaluation    PT/OT/SLP Co-Evaluation/Treatment: Yes Reason for Co-Treatment: Complexity of the patient's impairments (multi-system involvement);For patient/therapist safety;To address functional/ADL transfers PT goals addressed during session: Mobility/safety with mobility OT goals addressed during session: ADL's and self-care      AM-PAC OT "6 Clicks" Daily Activity     Outcome  Measure   Help from another person eating meals?: Total Help from another person taking care of personal grooming?: A Lot Help from another person toileting, which includes using toliet, bedpan, or urinal?: Total Help from another person bathing (including washing, rinsing, drying)?: Total Help from another person to put on and taking off regular upper body clothing?: A Lot Help from another person to put on and taking off regular lower body clothing?: Total 6 Click Score: 8    End of Session    OT Visit Diagnosis:  Other abnormalities of gait and mobility (R26.89);Muscle weakness (generalized) (M62.81);Other symptoms and signs involving cognitive function   Activity Tolerance Patient limited by lethargy;Other (comment) (Limited by impaired cognition.)   Patient Left in bed;with call bell/phone within reach;with bed alarm set;with family/visitor present   Nurse Communication          Time: 1336-1350 OT Time Calculation (min): 14 min  Charges: OT General Charges $OT Visit: 1 Visit OT Treatments $Therapeutic Activity: 8-22 mins  Rockney Ghee, M.S., OTR/L Ascom: [phone] 09/12/20, 2:45 PM

## 2020-09-12 NOTE — Evaluation (Signed)
Physical Therapy Evaluation Patient Details Name: Jacob White MRN: [id number] DOB: Aug 14, 1946 Today's Date: 09/12/2020   History of Present Illness  Jacob White is a 74 y.o. male with a stated past medical history of leukemia on injectable chemotherapy, type 2 diabetes, and hypertension who presents via EMS after family called with concerns of gradually declining function, generalized weakness, and poor p.o. intake. Imaging significant for cholelithiasis without complicating factors, diverticulosis without diverticulitis, and hyperechoic kidneys consistent with medical renal disease.    Clinical Impression  Pt is a 74 year old M admitted to hospital on 09/29/2020 for concerns of gradually declining function, generalized weakness, and poor p.o. intake. At baseline (prior to 6 months ago), pt was independent with all ADL's, IADL's, and ambulation without AD. Over the past few months pt has progressively declined, requiring increased assistance for mobility/ADL's, demonstrated bouts of confusion/dizziness, and has had decreased p.o. intake. Pt presents with generalized weakness, UMN signs, lethargy, decreased gross balance, decreased activity tolerance, pain, and altered skin temp/discoloration of LLE. Due to deficits, pt required total assist +2 for supine>sit transfer; further mobility limited secondary to pt cognition. Pt with intermittent assistance levels of SBA-total assist for maintenance of upright seated balance at EOB for ADL's with OT; pt demonstrated increased alertness while at EOB, but remained non-verbal and unable to follow >25% of commands. Pt demonstrated positive Babinski and Clonus of LLE, indicating UMN sign and possible spinal cord compression/involvement. Pt also with significantly decreased temperature, discoloration, and increased pain of LLE>RLE; unable to assess Babinski and Clonus on LLE due to increased pain with touch/movement. MD and RN notified of PT findings. Pt may  benefit from neuro consult, spinal imaging, and vascular consult, as appropriate, to address and further investigate PT findings. Deficits limit the pt's ability to safely and independently perform ADL's, transfer, and ambulate. Pt will benefit from acute skilled PT services to address deficits for return to baseline function. At this time, PT recommends 24/7 assist and SNF at DC. Son states that their ultimate goal is to bring pt home at DC, as they have family that works in healthcare and would be able to provide care pt needs. CM notified.     Follow Up Recommendations SNF    Equipment Recommendations  Other (comment) (Will defer to postacute facility)    Recommendations for Other Services Other (comment) (neuro consult due to UMN signs, vascular consult due to decreased temp/skin discoloration on LLE>RLE)     Precautions / Restrictions Precautions Precautions: Fall Precaution Comments: High fall Restrictions Weight Bearing Restrictions: No      Mobility  Bed Mobility Overal bed mobility: Needs Assistance Bed Mobility: Supine to Sit     Supine to sit: Total assist;+2 for physical assistance     General bed mobility comments: Total assist +2 for trunk/BLE facilitation to sit EOB.  Transfers Overall transfer level: Needs assistance               General transfer comment: Deferred for pt safety.  Ambulation/Gait                Stairs            Wheelchair Mobility    Modified Rankin (Stroke Patients Only)       Balance Overall balance assessment: Needs assistance Sitting-balance support: Bilateral upper extremity supported;Feet supported;Single extremity supported Sitting balance-Leahy Scale: Poor Sitting balance - Comments: Pt initially required total assist to sit EOB. With time, pt able to maintain seated balance with  SBA and BUE support. With fatigue, pt demonstrated L lateral lean that he was able to correct momentarily with tactile cues to R  obliques. Increased posterior lean noted with decreased BLE support. Postural control: Left lateral lean;Posterior lean     Standing balance comment: deferred                             Pertinent Vitals/Pain Pain Assessment: Faces Pain Score: 0-No pain Faces Pain Scale: Hurts even more Pain Location: L foot with touch/movement Pain Descriptors / Indicators: Grimacing;Other (Comment) (Increased pain behaviors including: grimacing and withdrawal noted with touch/movement) Pain Intervention(s): Limited activity within patient's tolerance;Repositioned;Relaxation;Other (comment) (RN notified)    Home Living Family/patient expects to be discharged to:: Private residence Living Arrangements: Spouse/significant other;Other (Comment) (and grandchildren) Available Help at Discharge: Available 24 hours/day Type of Home: Mobile home Home Access: Stairs to enter Entrance Stairs-Rails: Right;Left;Can reach both Entrance Stairs-Number of Steps: 4 Home Layout: One level Home Equipment: Cane - single point;Adaptive equipment      Prior Function Level of Independence: Independent         Comments: Per son, pt was walking independently, 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 Had one fall at the restaruant ~1 month ago. Has reported feeling light headed and dizzy for last month. Family report they've noted him stumbling more in the last month as well. Does not use AE for functional mobility     Hand Dominance   Dominant Hand: Right    Extremity/Trunk Assessment   Upper Extremity Assessment Upper Extremity Assessment: Generalized weakness;Difficult to assess due to impaired cognition    Lower Extremity Assessment Lower Extremity Assessment: Difficult to assess due to impaired cognition;Generalized weakness (Unable to assess secondary to pt lethargy; pt unable to follow commands for testing, but muscle activation noted  with passive movement of extremities)    Cervical / Trunk Assessment Cervical / Trunk Assessment: Kyphotic (L lateral cervical flexion)  Communication   Communication: Other (comment) (Pt unable to communicate with PT/OT or follow >25% of commands)  Cognition Arousal/Alertness: Lethargic Behavior During Therapy: Flat affect Overall Cognitive Status: Impaired/Different from baseline Area of Impairment: Following commands                       Following Commands: Follows one step commands inconsistently       General Comments: Pt is generally non-responsive, but able to slightly open eyes with verbal/tactile/noxious stimuli. Pt with improved alertness while seated at EOB, but returned to lethargy state with eyes closed immediately in supine.      General Comments      Exercises Other Exercises Other Exercises: Pt able to tolerate ~10 minutes of seated balance at EOB with intermittent assistance of total-SBA. Pt required increased assistance to maintain upright positioning with fatigue. Pt able to wash his face with OT and unilateral UE/Bil LE support, requiring min assist for maintainance of upright posture.   Assessment/Plan    PT Assessment Patient needs continued PT services  PT Problem List Decreased strength;Decreased mobility;Decreased safety awareness;Decreased range of motion;Decreased activity tolerance;Decreased cognition;Decreased balance;Pain       PT Treatment Interventions Gait training;Functional mobility training;Therapeutic activities;Stair training;Therapeutic exercise;Balance training;Neuromuscular re-education;Cognitive remediation;Patient/family education    PT Goals (Current goals can be found in the Care Plan section)  Acute Rehab PT Goals Patient Stated Goal: Pt unable to state, per son to  go home when able. PT Goal Formulation: With family Time For Goal Achievement: 09/26/20 Potential to Achieve Goals: Fair    Frequency Min 2X/week    Barriers to discharge        Co-evaluation PT/OT/SLP Co-Evaluation/Treatment: Yes Reason for Co-Treatment: Complexity of the patient's impairments (multi-system involvement);For patient/therapist safety;To address functional/ADL transfers PT goals addressed during session: Mobility/safety with mobility OT goals addressed during session: ADL's and self-care       AM-PAC PT "6 Clicks" Mobility  Outcome Measure Help needed turning from your back to your side while in a flat bed without using bedrails?: Total Help needed moving from lying on your back to sitting on the side of a flat bed without using bedrails?: Total Help needed moving to and from a bed to a chair (including a wheelchair)?: Total Help needed standing up from a chair using your arms (e.g., wheelchair or bedside chair)?: Total Help needed to walk in hospital room?: Total Help needed climbing 3-5 steps with a railing? : Total 6 Click Score: 6    End of Session   Activity Tolerance: Patient limited by fatigue;Patient limited by lethargy Patient left: in bed;with call bell/phone within reach;with bed alarm set;with family/visitor present Nurse Communication: Mobility status (positive UMN signs, temp/color changes of LLE>RLE) PT Visit Diagnosis: Unsteadiness on feet (R26.81);Muscle weakness (generalized) (M62.81);Difficulty in walking, not elsewhere classified (R26.2);Other abnormalities of gait and mobility (R26.89);Other symptoms and signs involving the nervous system (C58.527)    Time: 7824-2353 PT Time Calculation (min) (ACUTE ONLY): 20 min   Charges:   PT Evaluation $PT Eval Moderate Complexity: 1 Mod         Vira Blanco, PT, DPT 2:40 PM,09/12/20

## 2020-09-12 NOTE — Evaluation (Signed)
Occupational Therapy Evaluation Patient Details Name: Jacob White MRN: [id number] DOB: 20-Nov-1946 Today's Date: 09/12/2020    History of Present Illness Creedon "Jacob White" Morawski is a 74 y.o. male with a stated past medical history of leukemia on injectable chemotherapy, type 2 diabetes, and hypertension who presents via EMS after family called with concerns of gradually declining function, generalized weakness, and poor p.o. intake. Imaging significant for cholelithiasis without complicating factors, diverticulosis without diverticulitis, and hyperechoic kidneys consistent with medical renal disease.   Clinical Impression   Pt was seen for OT evaluation this date. Pt remains lethargic, unable to answer questions or follow VCs during session. Son at bedside reports up until ~1 month prior to admission pt had been getting up daily to go to his local breakfast restaurant to help out (mainly socializing, occasionally taking a coffee order, etc.) Per son, pt was not using any AE for functional mobility or ADL management and was independent with bathing, dressing, etc. Pt lives with his spouse and two adult grandchildren who are available to provide assist 24/7. Currently pt demonstrates impairments in cognition, strength, & safety awareness, which functionally limit his ability to perform ADL/self-care tasks. Pt currently requires MAX assist to perform bed level grooming tasks, he is unable to follow VCs or participate further in ADL management during session. Anticipate +2 MAX/TOTAL assist functional mobility, TOTAL assist for bed-level toileting, & bathing.  Pt would benefit from skilled OT services to address noted impairments and functional limitations (see below for any additional details) in order to maximize safety and independence while minimizing falls risk and caregiver burden. Upon hospital discharge, recommend STR to maximize pt safety and return to PLOF.      Follow Up Recommendations   Supervision/Assistance - 24 hour;SNF    Equipment Recommendations  3 in 1 bedside commode    Recommendations for Other Services       Precautions / Restrictions Precautions Precautions: Fall Precaution Comments: High fall Restrictions Weight Bearing Restrictions: No      Mobility Bed Mobility Overal bed mobility: Needs Assistance             General bed mobility comments: Max assist to adjust trunk in bed Further mobility deferred. Pt unsafe/unable to attempt.  Transfers Overall transfer level: Needs assistance               General transfer comment: Deferred for pt safety.    Balance Overall balance assessment: Needs assistance                                         ADL either performed or assessed with clinical judgement   ADL Overall ADL's : Needs assistance/impaired                                       General ADL Comments: Pt is significantly functionally limited by impaired cognition this date. He is received semi-supine in bed and requires max assist to adjust his position in bed. He is able to bring a washcloth up to his chin given multimodal prompting and moderate assist for shoulder elevation. Therapist provides max assist with washing remaining parts of face/forehead. Anticipate TOTAL assist for bed-level ADL management including LB bathing and dressing, toileting, etc. Pt to be evaluated by SLP for recommendations regarding diet. If cleared for  self-feeding, anticipate MAX-total assist to bring food to mouth at this time.     Vision Baseline Vision/History: Wears glasses Wears Glasses: Reading only       Perception     Praxis      Pertinent Vitals/Pain Pain Assessment: Faces Pain Score: 0-No pain     Hand Dominance Right   Extremity/Trunk Assessment Upper Extremity Assessment Upper Extremity Assessment: Generalized weakness;Difficult to assess due to impaired cognition   Lower Extremity  Assessment Lower Extremity Assessment: Generalized weakness;Difficult to assess due to impaired cognition       Communication     Cognition Arousal/Alertness: Lethargic   Overall Cognitive Status: Impaired/Different from baseline Area of Impairment: Following commands                       Following Commands: Follows one step commands inconsistently       General Comments: Pt is generally non-responsive. He does not open eyes or respond to verbal/tactile stimuli, however he is awake. He is sitting upright in bed, noted to engage in pill rolling motion with his R hand. Given significant increased time is able to open eyes and looks at son during session, but does not verbalize or appear to purposefully respond t/o.   General Comments       Exercises Other Exercises Other Exercises: Pt education limited due to impaired cognition. Caregiver at bedside (son) educated on role of OT in acute setting, discharge considerations for STR vs HH,  falls prevention strategies, strategies for supporting pt maintenance of a regular sleep/wake cycle, and supporing pt participation in self-care tasks during hospital stay.   Shoulder Instructions      Home Living Family/patient expects to be discharged to:: Private residence Living Arrangements: Spouse/significant other;Other (Comment) ("Grandchildren) Available Help at Discharge: Available 24 hours/day Type of Home: Mobile home Home Access: Stairs to enter [employer] of Steps: 4 Entrance Stairs-Rails: Right;Left;Can reach both Home Layout: One level     Bathroom Shower/Tub: Chief Strategy Officer: Standard Bathroom Accessibility: Yes How Accessible: Accessible via wheelchair Home Equipment: Cane - single point;Adaptive equipment Adaptive Equipment: Reacher        Prior Functioning/Environment Level of Independence: Independent        Comments: Per son, pt was walking independently, 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 Had one fall at the restaruant ~1 month ago. Has reported feeling light headed and dizzy for last month. Family report they've noted him stumbling more in the last month as well. Does not use AE for functional mobility        OT Problem List: Decreased strength;Decreased coordination;Decreased range of motion;Decreased cognition;Decreased activity tolerance;Decreased safety awareness;Impaired balance (sitting and/or standing);Decreased knowledge of use of DME or AE;Impaired UE functional use      OT Treatment/Interventions: Self-care/ADL training;Therapeutic exercise;Therapeutic activities;DME and/or AE instruction;Patient/family education;Cognitive remediation/compensation;Energy conservation;Balance training    OT Goals(Current goals can be found in the care plan section) Acute Rehab OT Goals Patient Stated Goal: Pt unable to state, per son to go home when able. OT Goal Formulation: With patient/family Time For Goal Achievement: 09/26/20 Potential to Achieve Goals: Good ADL Goals Pt Will Perform Grooming: sitting;with min assist (c LRAD PRN for improved safety and functional independence) Pt Will Perform Lower Body Dressing: sit to/from stand;with mod assist (c LRAD PRN for improved safety and functional independence) Pt Will Transfer to Toilet: bedside commode;ambulating;with min assist (c LRAD  PRN for improved safety and functional independence)  OT Frequency: Min 1X/week   Barriers to D/C:            Co-evaluation              AM-PAC OT "6 Clicks" Daily Activity     Outcome Measure Help from another person eating meals?: Total (Per SLP NPO at this time.) Help from another person taking care of personal grooming?: A Lot Help from another person toileting, which includes using toliet, bedpan, or urinal?: Total Help from another person bathing (including washing, rinsing, drying)?: Total Help  from another person to put on and taking off regular upper body clothing?: A Lot Help from another person to put on and taking off regular lower body clothing?: Total 6 Click Score: 8   End of Session    Activity Tolerance: Patient limited by lethargy;Other (comment) (Limited by impaired cognition.) Patient left: in bed;with call bell/phone within reach;with bed alarm set;with family/visitor present  OT Visit Diagnosis: Other abnormalities of gait and mobility (R26.89);Muscle weakness (generalized) (M62.81);Other symptoms and signs involving cognitive function                Time: 9163-8466 OT Time Calculation (min): 23 min Charges:  OT General Charges $OT Visit: 1 Visit OT Evaluation $OT Eval Moderate Complexity: 1 Mod OT Treatments $Self Care/Home Management : 8-22 mins  Rockney Ghee, M.S., OTR/L Ascom: [phone] 09/12/20, 11:25 AM

## 2020-09-12 NOTE — Consult Note (Signed)
Consultation Note Date: 09/12/2020   Patient Name: Jacob White  DOB: Feb 14, 1946  MRN: [id number]  Age / Sex: 74 y.o., male  PCP: Patient, No Pcp Per Referring Physician: Loletha Grayer, MD  Reason for Consultation: Establishing goals of care  HPI/Patient Profile: 74 y.o. male  with past medical history of T2DM, HTN, HLD, ?dementia, and leukemia admitted on 09/12/2020 with poor PO intake, weakness, and weight loss.  Patient has remained lethargic during hospitalization. Unable to tolerate PO intake d/t mental status. Also with AKI. Suspected UTI - started on rocephin. Elevated LFTs and elevated PSA. Pending MRI of brain and spine. PMT consulted to discuss [place].   Clinical Assessment and Goals of Care: I have reviewed medical records including EPIC notes, labs and imaging, received report from RN, assessed the patient and then met with patient's son Audry Pili  to discuss diagnosis prognosis, GOC, EOL wishes, disposition and options.  Audry Pili shares that patient's spouse is also ill and so he is serving as Scientist, research (medical).   I introduced Palliative Medicine as specialized medical care for people living with serious illness. It focuses on providing relief from the symptoms and stress of a serious illness. The goal is to improve quality of life for both the patient and the family.  We discussed a brief life review of the patient. Patient worked for most of his life in [employer]. 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 We talk about how loyal and hardworking Mr. Bresee is.   As far as functional and nutritional status, son tells me of decline over the past couple of months - decline in function and PO intake. Also tells me patient has been getting confused easily at home - he suspected dementia.    We discussed patient's current illness and what it means in the larger context  of patient's on-going co-morbidities.  Natural disease trajectory and expectations at EOL were discussed. We discussed uncertainty of diagnosis and pending tests. We discussed organ dysfunction. We discussed patient's AMS and inability to tolerate POs. Discussed UTI. Son acknowledges that patient is very ill.   I attempted to elicit values and goals of care important to the patient.  Son tells me "he would not want to be like this". Quality of life is important to patient.  The difference between aggressive medical intervention and comfort care was considered in light of the patient's goals of care. Advance directives, concepts specific to code status, artificial feeding and hydration, and rehospitalization were considered and discussed.  Encouraged son to consider DNR/DNI status understanding evidenced based poor outcomes in similar hospitalized patients, as the cause of the arrest is likely associated with chronic/terminal disease rather than a reversible acute cardio-pulmonary event. Son agrees that DNR is appropriate and thinks this would align with patient's goals of care.  Son shares that they are not interested in any placement other than home when patient is ready for discharge - they have family members that work in the medical field and can help provide care at home.   Discussed with son the importance of continued conversation with family and the medical providers regarding overall plan of care and treatment options, ensuring decisions are within the context of the patient's values and GOCs.    Questions and concerns were addressed. The family was encouraged to call with questions or concerns.   Primary Decision Maker NEXT OF KIN - son Audry Pili as patient's spouse is ill  SUMMARY OF RECOMMENDATIONS   - initial goals of care discussion, son aware of condition and guarded prognosis - son agreed to code status change to DNR - PMT will follow closely, allow time for outcomes, further testing    Code Status/Advance Care Planning:  DNR  Prognosis:   Unable to determine  Discharge Planning: To Be Determined      Primary Diagnoses: Present on Admission: . AKI (acute kidney injury) ([place])   I have reviewed the medical record, interviewed the patient and family, and examined the patient. The following aspects are pertinent.  Past Medical History:  Diagnosis Date  . Diabetes mellitus without complication ([place])   . Hyperlipemia   . Hypertension    Social History   Socioeconomic History  . Marital status: Married    Spouse name: Not on file  . Number of children: Not on file  . Years of education: Not on file  . Highest education level: Not on file  Occupational History  . Not on file  Tobacco Use  . Smoking status: Never Smoker  Substance and Sexual Activity  . Alcohol use: No  . Drug use: Not on file  . Sexual activity: Not on file  Other Topics Concern  . Not on file  Social History Narrative  . Not on file   Social Determinants of Health   Financial Resource Strain:   . Difficulty of Paying Living Expenses: Not on file  Food Insecurity:   . Worried About Charity fundraiser in the Last Year: Not on file  . Ran Out of Food in the Last Year: Not on file  Transportation Needs:   . Lack of Transportation (Medical): Not on file  . Lack of Transportation (Non-Medical): Not on file  Physical Activity:   . Days of Exercise per Week: Not on file  . Minutes of Exercise per Session: Not on file  Stress:   . Feeling of Stress : Not on file  Social Connections:   . Frequency of Communication with Friends and Family: Not on file  . Frequency of Social Gatherings with Friends and Family: Not on file  . Attends Religious Services: Not on file  . Active Member of Clubs or Organizations: Not on file  . Attends [employer] Meetings: Not on file  . Marital Status: Not on file   History reviewed. No pertinent family history. Scheduled Meds: .  Chlorhexidine Gluconate Cloth  6 each Topical Daily  . donepezil  10 mg Oral QHS  . heparin  5,000 Units Subcutaneous Q8H  . insulin aspart  0-5 Units Subcutaneous QHS  . insulin aspart  0-9 Units Subcutaneous TID WC  . levothyroxine  25 mcg Oral Daily  . tamsulosin  0.4 mg Oral QPC supper   Continuous Infusions: . cefTRIAXone (ROCEPHIN)  IV 1 g (09/12/20 1120)  . fluconazole (DIFLUCAN) IV    . lactated ringers 100 mL/hr at 09/12/20 1117   PRN Meds:.acetaminophen **OR** acetaminophen, hydrALAZINE, iohexol, ondansetron **OR** ondansetron (ZOFRAN) IV, polyethylene glycol No Known Allergies Review of Systems  Unable to perform ROS: Mental status change    Physical Exam Constitutional:      General: He is not in acute distress. Pulmonary:     Effort: Pulmonary effort is normal.  Musculoskeletal:     Comments: LLE cool to touch, discolored (MD aware)  Skin:    General: Skin is warm and dry.  Neurological:     Mental Status: He is lethargic.  Vital Signs: BP 135/84 (BP Location: Left Arm)   Pulse 75   Temp 98.4 F (36.9 C) (Oral)   Resp 16   Ht _0  (1.676 m)   Wt 54.4 kg   SpO2 92%   BMI 19.37 kg/m  Pain Scale: PAINAD   Pain Score: 0-No pain   SpO2: SpO2: 92 % O2 Device:SpO2: 92 % O2 Flow Rate: .   IO: Intake/output summary:   Intake/Output Summary (Last 24 hours) at 09/12/2020 1530 Last data filed at 09/12/2020 9983 Gross per 24 hour  Intake --  Output 1800 ml  Net -1800 ml    LBM:   Baseline Weight: Weight: 54.4 kg Most recent weight: Weight: 54.4 kg     Palliative Assessment/Data: PPS 10%    Time Total: 75 minutes Greater than 50%  of this time was spent counseling and coordinating care related to the above assessment and plan.  Juel Burrow, DNP, AGNP-C Palliative Medicine Team [phone] Pager: [phone]

## 2020-09-12 NOTE — Evaluation (Signed)
Clinical/Bedside Swallow Evaluation Patient Details  Name: Jacob White MRN: [id number] Date of Birth: May 04, 1946  Today's Date: 09/12/2020 Time: SLP Start Time (ACUTE ONLY): 7829 SLP Stop Time (ACUTE ONLY): 1000 SLP Time Calculation (min) (ACUTE ONLY): 35 min  Past Medical History:  Past Medical History:  Diagnosis Date  . Diabetes mellitus without complication (HCC)   . Hyperlipemia   . Hypertension    Past Surgical History:  Past Surgical History:  Procedure Laterality Date  . TEE WITHOUT CARDIOVERSION N/A 06/12/2015   Procedure: TRANSESOPHAGEAL ECHOCARDIOGRAM (TEE);  Surgeon: Chrystie Nose, MD;  Location: [facility] ENDOSCOPY;  Service: Cardiovascular;  Laterality: N/A;   HPI:  Jacob White is a 74 y.o. male with medical history significant of type 2 diabetes mellitus, hypertension and hyperlipidemia was brought to ED via EMS after family called that he is sick. Daughter reports pt has not been doing well for the past couple of months, gradually declinin, poor PO intake, requiring assistance with ADLs. Imaging did reveal any acute or active cardiopulmonary disease and CT of abdomen noted Cholelithiasis without complicating factorsand Diverticulosis without diverticulitis.   Assessment / Plan / Recommendation Clinical Impression  Pt is severely encephalopathic and is largely non responsive. Pt slightly opened his eyes when his son spoke his name but there was not attempt to localize son or any indication of response. He was not responsive to deep sternal rub with this therapist. An ice chip was introduced at pt's lips but he was not responsive to having it enter his mouth and was removed by this Clinical research associate. In his current state, pt is at a very high risk of aspiration and further malnutrition/dehydration.  Education provided to pt's son on high risk of aspiration and recommendation for strict NPO. He voiced understanding.   Pt's son and pt's wife (via phone) provided the following information.  They started suspecting unconfirmed dx of dementia > 6 months prior as pt was continually disoriented to time of day (he confused afternoons with mornings) and he began demonstrating confused unsafe behaviors such as putting the cheese in the toaster oven instead of the bread. As a result, family is present in the home 24 hours a day. His wife stated that ~ 1 to 2 months prior pt began throwing up after eating or drinking, As a result, pt decreased PO intake and at time of admission he was consuming several bites of applesauce and sips of water. His wife stated that even with current small amount of consumption, pt was throwing up afterwards even with 2 sips of water. Pt began refusing his medicines as they also made him throw up.  Per her report, this was a severe chronic occurrence.   At this time, prognosis is very guarded for return to safe PO intake, recommend alternative means of nutrition if it aligns with pt's GOC. ST will follow chart for any changes in pt's condition.      SLP Visit Diagnosis: Dysphagia, unspecified (R13.10)    Aspiration Risk  Severe aspiration risk;Risk for inadequate nutrition/hydration    Diet Recommendation NPO   Medication Administration: Via alternative means    Other  Recommendations Recommended Consults: Consider GI evaluation Oral Care Recommendations: Oral care QID   Follow up Recommendations  (Palliative Care consult to establish GOC)      Frequency and Duration min 1 x/week  2 weeks       Prognosis Prognosis for Safe Diet Advancement: Guarded Barriers to Reach Goals: Cognitive deficits;Time post onset;Severity of deficits  Swallow Study   General Date of Onset: 09/11/20 HPI: Jacob White is a 74 y.o. male with medical history significant of type 2 diabetes mellitus, hypertension and hyperlipidemia was brought to ED via EMS after family called that he is sick. Daughter reports pt has not been doing well for the past couple of months,  gradually declinin, poor PO intake, requiring assistance with ADLs. Imaging did reveal any acute or active cardiopulmonary disease and CT of abdomen noted Cholelithiasis without complicating factorsand Diverticulosis without diverticulitis. Type of Study: Bedside Swallow Evaluation Previous Swallow Assessment: none in chart Diet Prior to this Study: NPO Temperature Spikes Noted: No Respiratory Status: Room air History of Recent Intubation: No Behavior/Cognition:  (nonresponsive) Oral Cavity Assessment: Dry;Dried secretions Oral Care Completed by SLP: Recent completion by staff Patient Positioning: Upright in bed Baseline Vocal Quality: Not observed Volitional Cough: Cognitively unable to elicit Volitional Swallow: Unable to elicit    Oral/Motor/Sensory Function Overall Oral Motor/Sensory Function:  (unable to assess)   Ice Chips Ice chips: Impaired Oral Phase Impairments: Poor awareness of bolus Other Comments: no movement in response to ice chip, no swallow appreciated   Thin Liquid Thin Liquid: Not tested    Nectar Thick Nectar Thick Liquid: Not tested   Honey Thick Honey Thick Liquid: Not tested   Puree Puree: Not tested   Solid     Solid: Not tested     Jaz Mallick B. Dreama Saa M.S., CCC-SLP, [facility] Speech-Language Pathologist Rehabilitation Services Office [phone]  Antar Milks 09/12/2020,1:50 PM

## 2020-09-12 NOTE — Progress Notes (Signed)
Inpatient Diabetes Program Recommendations  AACE/ADA: New Consensus Statement on Inpatient Glycemic Control (2015)  Target Ranges:  Prepandial:   less than 140 mg/dL      Peak postprandial:   less than 180 mg/dL (1-2 hours)      Critically ill patients:  140 - 180 mg/dL   Lab Results  Component Value Date   GLUCAP 169 (H) 09/12/2020   HGBA1C 9.1 (H) 2020-09-21    Review of Glycemic ControlResults for DENNEY, SHEIN (MRN [id number]) as of 09/12/2020 13:27  Ref. Range 09/11/2020 15:04 09/11/2020 18:13 09/11/2020 21:25 09/12/2020 08:25 09/12/2020 12:25  Glucose-Capillary Latest Ref Range: 70 - 99 mg/dL 588 (H) 325 (H) 498 (H) 164 (H) 169 (H)   Diabetes history: DM 2 Outpatient Diabetes medications: Trulicity 0.75 weekly, Metformin 1000 mg bid Current orders for Inpatient glycemic control:  Novolog sensitive tid with meals and HS Lantus 10 units q HS Inpatient Diabetes Program Recommendations:    Per documentation, patient did not receive Lantus last PM.  Patient is currently not eating or drinking.  Blood sugars improved this AM.  Consider holding Lantus for now.   Thanks,  Beryl Meager, RN, BC-ADM Inpatient Diabetes Coordinator Pager [phone] (8a-5p)

## 2020-09-12 NOTE — Progress Notes (Addendum)
[facility] [place], [place] 09/12/20  Subjective:   Hospital day # 2  Patient resting in bed, awake, but appears confused,unable to answer questions.Son at the bedside.  09/28 0701 - 09/29 0700 In: -  Out: 2850 [Urine:2850] Lab Results  Component Value Date   CREATININE 1.95 (H) 09/12/2020   CREATININE 2.05 (H) 09/11/2020   CREATININE 2.92 (H) 09/01/2020     Objective:  Vital signs in last 24 hours:  Temp:  [98.3 F (36.8 C)-98.6 F (37 C)] 98.4 F (36.9 C) (09/29 0753) Pulse Rate:  [70-75] 75 (09/29 0753) Resp:  [16-20] 16 (09/29 0753) BP: (120-135)/(68-87) 135/84 (09/29 0753) SpO2:  [92 %-94 %] 92 % (09/29 0753)  Weight change:  Filed Weights   09/07/2020 1454  Weight: 54.4 kg    Intake/Output:    Intake/Output Summary (Last 24 hours) at 09/12/2020 0938 Last data filed at 09/12/2020 16100623 Gross per 24 hour  Intake --  Output 2850 ml  Net -2850 ml     Physical Exam: General: Awake,in no obvious distress  HEENT Normocephalic,Atraumatic  Pulm/lungs Normal and symmetrical respiratory Effort, lungs clear  CVS/Heart S1S2,Regular  Abdomen:  Soft, non distended  Extremities: No peripheral edema  Neurologic: Awake, Alert with flat affect,not responding to questions  Skin: No acute rashes or lesions    Basic Metabolic Panel:  Recent Labs  Lab 09/13/2020 1457 09/11/20 0513 09/12/20 0642  NA 138 141 143  K 4.3 4.6 4.3  CL 97* 100 104  CO2 26 27 19*  GLUCOSE 341* 166* 171*  BUN 107* 84* 56*  CREATININE 2.92* 2.05* 1.95*  CALCIUM 8.8* 8.6* 8.6*  PHOS  --   --  3.7     CBC: Recent Labs  Lab 09/07/2020 1457 09/11/20 0513 09/12/20 0642  WBC 8.4 7.9 8.9  HGB 17.0 16.4 16.1  HCT 51.0 47.1 46.4  MCV 87.0 85.2 85.8  PLT 152 118* 121*     No results found for: HEPBSAG, HEPBSAB, HEPBIGM    Microbiology:  Recent Results (from the past 240 hour(s))  Respiratory Panel by RT PCR (Flu A&B, Covid) - Nasopharyngeal Swab     Status:  None   Collection Time: 08/20/2020  3:29 PM   Specimen: Nasopharyngeal Swab  Result Value Ref Range Status   SARS Coronavirus 2 by RT PCR NEGATIVE NEGATIVE Final    Comment: (NOTE) SARS-CoV-2 target nucleic acids are NOT DETECTED.  The SARS-CoV-2 RNA is generally detectable in upper respiratoy specimens during the acute phase of infection. The lowest concentration of SARS-CoV-2 viral copies this assay can detect is 131 copies/mL. A negative result does not preclude SARS-Cov-2 infection and should not be used as the sole basis for treatment or other patient management decisions. A negative result may occur with  improper specimen collection/handling, submission of specimen other than nasopharyngeal swab, presence of viral mutation(s) within the areas targeted by this assay, and inadequate number of viral copies (<131 copies/mL). A negative result must be combined with clinical observations, patient history, and epidemiological information. The expected result is Negative.  Fact Sheet for Patients:  https://www.moore.com/https://www.fda.gov/media/142436/download  Fact Sheet for Healthcare Providers:  https://www.young.biz/https://www.fda.gov/media/142435/download  This test is no t yet approved or cleared by the [place] FDA and  has been authorized for detection and/or diagnosis of SARS-CoV-2 by FDA under an Emergency Use Authorization (EUA). This EUA will remain  in effect (meaning this test can be used) for the duration of the COVID-19 declaration under Section 564(b)(1) of the Act,  21 U.S.C. section 360bbb-3(b)(1), unless the authorization is terminated or revoked sooner.     Influenza A by PCR NEGATIVE NEGATIVE Final   Influenza B by PCR NEGATIVE NEGATIVE Final    Comment: (NOTE) The Xpert Xpress SARS-CoV-2/FLU/RSV assay is intended as an aid in  the diagnosis of influenza from Nasopharyngeal swab specimens and  should not be used as a sole basis for treatment. Nasal washings and  aspirates are unacceptable for  Xpert Xpress SARS-CoV-2/FLU/RSV  testing.  Fact Sheet for Patients: https://www.moore.com/  Fact Sheet for Healthcare Providers: https://www.young.biz/  This test is not yet approved or cleared by the [place] FDA and  has been authorized for detection and/or diagnosis of SARS-CoV-2 by  FDA under an Emergency Use Authorization (EUA). This EUA will remain  in effect (meaning this test can be used) for the duration of the  Covid-19 declaration under Section 564(b)(1) of the Act, 21  U.S.C. section 360bbb-3(b)(1), unless the authorization is  terminated or revoked. Performed at [facility], [address] [place], [place] [address]   Blood culture (routine single)     Status: None (Preliminary result)   Collection Time: 08/29/2020  3:29 PM   Specimen: BLOOD  Result Value Ref Range Status   Specimen Description BLOOD BLOOD LEFT FOREARM  Final   Special Requests   Final    BOTTLES DRAWN AEROBIC AND ANAEROBIC Blood Culture results may not be optimal due to an inadequate volume of blood received in culture bottles   Culture   Final    NO GROWTH 2 DAYS Performed at [facility], [address], [place], [place] [address]    Report Status PENDING  Incomplete  Urine culture     Status: Abnormal   Collection Time: 09/03/2020  6:26 PM   Specimen: In/Out Cath Urine  Result Value Ref Range Status   Specimen Description   Final    IN/OUT CATH URINE Performed at [facility], [address], [place], [place] [address]    Special Requests   Final    NONE Performed at [facility], [address], [place], [place] [address]    Culture MULTIPLE SPECIES PRESENT, SUGGEST RECOLLECTION (A)  Final   Report Status 09/11/2020 FINAL  Final    Coagulation Studies: Recent Labs    08/31/2020 1457  LABPROT 13.3  INR 1.1    Urinalysis: Recent Labs    09/04/2020 1826  COLORURINE YELLOW*  LABSPEC 1.016  PHURINE  5.0  GLUCOSEU >=500*  HGBUR LARGE*  BILIRUBINUR NEGATIVE  KETONESUR 5*  PROTEINUR 30*  NITRITE NEGATIVE  LEUKOCYTESUR MODERATE*      Imaging: CT ABDOMEN PELVIS WO CONTRAST  Result Date: 09/11/2020 CLINICAL DATA:  Unintended weight loss EXAM: CT CHEST, ABDOMEN AND PELVIS WITHOUT CONTRAST TECHNIQUE: Multidetector CT imaging of the chest, abdomen and pelvis was performed following the standard protocol without IV contrast. COMPARISON:  01/03/2013, chest x-ray from 08/24/2020 FINDINGS: CT CHEST FINDINGS Cardiovascular: Diffuse atherosclerotic calcifications are noted in the thoracic aorta. No definitive aneurysmal dilatation is noted. No cardiac enlargement is seen. Coronary calcifications are noted. No pericardial effusion is seen. Mediastinum/Nodes: Thoracic inlet is within normal limits. No hilar or mediastinal adenopathy is noted. The esophagus as visualized is within normal limits. Lungs/Pleura: Lungs are well aerated bilaterally. Mild emphysematous changes are noted. No focal confluent infiltrate or sizable effusion is seen. No parenchymal nodule is noted. Musculoskeletal: Degenerative changes of the thoracic spine are seen. No acute bony abnormality is noted. CT  ABDOMEN PELVIS FINDINGS Hepatobiliary: Dependent gallstones are noted. No other focal abnormality is noted or gallbladder. Pancreas: Fatty interdigitation of the pancreas is noted. Spleen: Normal in size without focal abnormality. Adrenals/Urinary Tract: Adrenal glands are within normal limits. Kidneys demonstrate no mass lesion or hydronephrosis. No renal calculi are seen. Bladder is well distended although a Foley catheter is noted in place. Air is noted likely related to the recent instrumentation. Stomach/Bowel: Diverticular change of the colon is noted without evidence of diverticulitis. No definitive mass lesion or obstructive change is seen. The appendix is within normal limits. No small bowel or gastric abnormality is seen.  Vascular/Lymphatic: Aortic atherosclerosis. No enlarged abdominal or pelvic lymph nodes. Reproductive: Prostate is unremarkable. Other: No abdominal wall hernia or abnormality. No abdominopelvic ascites. Musculoskeletal: No acute or significant osseous findings. IMPRESSION: CT of the chest: No acute abnormality noted. Aortic Atherosclerosis (ICD10-I70.0) and Emphysema (ICD10-J43.9). CT of the abdomen and pelvis: Cholelithiasis without complicating factors. Diverticulosis without diverticulitis. Electronically Signed   By: Alcide Clever M.D.   On: 09/11/2020 14:40   CT CHEST WO CONTRAST  Result Date: 09/11/2020 CLINICAL DATA:  Unintended weight loss EXAM: CT CHEST, ABDOMEN AND PELVIS WITHOUT CONTRAST TECHNIQUE: Multidetector CT imaging of the chest, abdomen and pelvis was performed following the standard protocol without IV contrast. COMPARISON:  01/03/2013, chest x-ray from 08/30/2020 FINDINGS: CT CHEST FINDINGS Cardiovascular: Diffuse atherosclerotic calcifications are noted in the thoracic aorta. No definitive aneurysmal dilatation is noted. No cardiac enlargement is seen. Coronary calcifications are noted. No pericardial effusion is seen. Mediastinum/Nodes: Thoracic inlet is within normal limits. No hilar or mediastinal adenopathy is noted. The esophagus as visualized is within normal limits. Lungs/Pleura: Lungs are well aerated bilaterally. Mild emphysematous changes are noted. No focal confluent infiltrate or sizable effusion is seen. No parenchymal nodule is noted. Musculoskeletal: Degenerative changes of the thoracic spine are seen. No acute bony abnormality is noted. CT ABDOMEN PELVIS FINDINGS Hepatobiliary: Dependent gallstones are noted. No other focal abnormality is noted or gallbladder. Pancreas: Fatty interdigitation of the pancreas is noted. Spleen: Normal in size without focal abnormality. Adrenals/Urinary Tract: Adrenal glands are within normal limits. Kidneys demonstrate no mass lesion or  hydronephrosis. No renal calculi are seen. Bladder is well distended although a Foley catheter is noted in place. Air is noted likely related to the recent instrumentation. Stomach/Bowel: Diverticular change of the colon is noted without evidence of diverticulitis. No definitive mass lesion or obstructive change is seen. The appendix is within normal limits. No small bowel or gastric abnormality is seen. Vascular/Lymphatic: Aortic atherosclerosis. No enlarged abdominal or pelvic lymph nodes. Reproductive: Prostate is unremarkable. Other: No abdominal wall hernia or abnormality. No abdominopelvic ascites. Musculoskeletal: No acute or significant osseous findings. IMPRESSION: CT of the chest: No acute abnormality noted. Aortic Atherosclerosis (ICD10-I70.0) and Emphysema (ICD10-J43.9). CT of the abdomen and pelvis: Cholelithiasis without complicating factors. Diverticulosis without diverticulitis. Electronically Signed   By: Alcide Clever M.D.   On: 09/11/2020 14:40   US RENAL  Result Date: 09/04/2020 CLINICAL DATA:  Acute kidney injury EXAM: RENAL / URINARY TRACT ULTRASOUND COMPLETE COMPARISON:  None. FINDINGS: Right Kidney: Renal measurements: 10.4 x 5.2 x 4.7 cm = volume: 134 mL. Hyperechoic. No mass or hydronephrosis visualized. Left Kidney: Renal measurements: 10.0 x 5.1 x 5.0 cm = volume: 135 mL. Hyperechoic. No mass or hydronephrosis visualized. Bladder: Decompressed by Foley catheter Other: None. IMPRESSION: Hyperechoic kidneys, consistent with medical renal disease. No other abnormality. Electronically Signed   By: Deatra Robinson  M.D.   On: 08/24/2020 19:12   DG Chest Port 1 View  Result Date: 08/21/2020 CLINICAL DATA:  Lethargy and altered mental status. EXAM: PORTABLE CHEST 1 VIEW COMPARISON:  June 09, 2015 FINDINGS: Multiple overlying radiopaque cardiac lead wires are seen. The heart size and mediastinal contours are within normal limits. Tortuosity of the descending thoracic aorta is seen. Both lungs  are clear. The visualized skeletal structures are unremarkable. IMPRESSION: 1. No acute or active cardiopulmonary disease. Electronically Signed   By: Aram Candela M.D.   On: 09/11/2020 16:09     Medications:    cefTRIAXone (ROCEPHIN)  IV Stopped (09/11/20 1100)   lactated ringers 100 mL/hr at 09/12/20 0000    Chlorhexidine Gluconate Cloth  6 each Topical Daily   donepezil  10 mg Oral QHS   heparin  5,000 Units Subcutaneous Q8H   insulin aspart  0-5 Units Subcutaneous QHS   insulin aspart  0-9 Units Subcutaneous TID WC   insulin glargine  10 Units Subcutaneous QHS   levothyroxine  25 mcg Oral Daily   pravastatin  40 mg Oral QHS   tamsulosin  0.4 mg Oral QPC supper   acetaminophen **OR** acetaminophen, hydrALAZINE, iohexol, ondansetron **OR** ondansetron (ZOFRAN) IV, polyethylene glycol  Assessment/ Plan:  74 y.o. male with     admitted on 08/29/2020 for Dehydration [E86.0] AKI (acute kidney injury) (HCC) [N17.9] Altered mental status, unspecified altered mental status type [R41.82]   #Acute kidney injury Baseline creatinine of 0.76, GFR greater than 60 from July 2016 Admission/presenting creatinine of 2.92 which is starting to improve with IV hydration.  Creatinine today is 1.95  Renal ultrasound shows hyperechoic kidneys, no other abnormality. Continue IV hydration Keep holding ACE inhibitor, Metformin  #Hematuria H/o gross hematuria CT of the chest and abdomen without contrast is negative for any bladder mass.May need outpatient urology evaluation for hematuria and urinary retention PSA level on 08/17/2020 is  7.74  #Diabetes type 2, uncontrolled Recent Labs       Lab Results  Component Value Date   HGBA1C 9.1 (H) 09/09/2020     Currently on sliding scale and long acting insulins,holding aggressive outpatient diabetic medication regimen   Patient seen and evaluated with Denna Haggard, NP She assisted with transcription of this note.

## 2020-09-13 ENCOUNTER — Inpatient Hospital Stay: Payer: Medicare Other

## 2020-09-13 DIAGNOSIS — Z515 Encounter for palliative care: Secondary | ICD-10-CM

## 2020-09-13 DIAGNOSIS — Z7189 Other specified counseling: Secondary | ICD-10-CM

## 2020-09-13 DIAGNOSIS — I63413 Cerebral infarction due to embolism of bilateral middle cerebral arteries: Principal | ICD-10-CM

## 2020-09-13 LAB — LIPID PANEL
Cholesterol: 163 mg/dL (ref 0–200)
HDL: 50 mg/dL (ref >40)
LDL Cholesterol: 88 mg/dL (ref 0–99)
Total CHOL/HDL Ratio: 3.3 RATIO
Triglycerides: 125 mg/dL (ref <150)
VLDL: 25 mg/dL (ref 0–40)

## 2020-09-13 LAB — BASIC METABOLIC PANEL
Anion gap: 22 — ABNORMAL HIGH (ref 5–15)
BUN: 52 mg/dL — ABNORMAL HIGH (ref 8–23)
CO2: 17 mmol/L — ABNORMAL LOW (ref 22–32)
Calcium: 8.5 mg/dL — ABNORMAL LOW (ref 8.9–10.3)
Chloride: 111 mmol/L (ref 98–111)
Creatinine, Ser: 1.88 mg/dL — ABNORMAL HIGH (ref 0.61–1.24)
GFR calc Af Amer: 40 mL/min — ABNORMAL LOW (ref >60)
GFR calc non Af Amer: 34 mL/min — ABNORMAL LOW (ref >60)
Glucose, Bld: 161 mg/dL — ABNORMAL HIGH (ref 70–99)
Potassium: 4.3 mmol/L (ref 3.5–5.1)
Sodium: 150 mmol/L — ABNORMAL HIGH (ref 135–145)

## 2020-09-13 LAB — PROTEIN ELECTROPHORESIS, SERUM
A/G Ratio: 0.9 (ref 0.7–1.7)
Albumin ELP: 2.9 g/dL (ref 2.9–4.4)
Alpha-1-Globulin: 0.3 g/dL (ref 0.0–0.4)
Alpha-2-Globulin: 1 g/dL (ref 0.4–1.0)
Beta Globulin: 0.8 g/dL (ref 0.7–1.3)
Gamma Globulin: 1 g/dL (ref 0.4–1.8)
Globulin, Total: 3.1 g/dL (ref 2.2–3.9)
Total Protein ELP: 6 g/dL (ref 6.0–8.5)

## 2020-09-13 LAB — GLUCOSE, CAPILLARY: Glucose-Capillary: 144 mg/dL — ABNORMAL HIGH (ref 70–99)

## 2020-09-13 LAB — KAPPA/LAMBDA LIGHT CHAINS
Kappa free light chain: 44.8 mg/L — ABNORMAL HIGH (ref 3.3–19.4)
Kappa, lambda light chain ratio: 1.24 (ref 0.26–1.65)
Lambda free light chains: 36.2 mg/L — ABNORMAL HIGH (ref 5.7–26.3)

## 2020-09-13 LAB — ANCA TITERS
Atypical P-ANCA titer: <1:20 {titer}
C-ANCA: <1:20 {titer}
P-ANCA: <1:20 {titer}

## 2020-09-13 LAB — C4 COMPLEMENT: Complement C4, Body Fluid: 37 mg/dL (ref 12–38)

## 2020-09-13 LAB — ANA W/REFLEX IF POSITIVE: Anti Nuclear Antibody (ANA): NEGATIVE

## 2020-09-13 LAB — PARATHYROID HORMONE, INTACT (NO CA): PTH: 34 pg/mL (ref 15–65)

## 2020-09-13 LAB — C3 COMPLEMENT: C3 Complement: 121 mg/dL (ref 82–167)

## 2020-09-13 LAB — VITAMIN B12: Vitamin B-12: 162 pg/mL — ABNORMAL LOW (ref 180–914)

## 2020-09-13 LAB — RPR: RPR Ser Ql: NONREACTIVE

## 2020-09-13 MED ORDER — HALOPERIDOL LACTATE 5 MG/ML IJ SOLN: 0.5000 mg | INTRAMUSCULAR | Status: DC | PRN

## 2020-09-13 MED ORDER — MORPHINE BOLUS VIA INFUSION
2.0000 mg | INTRAVENOUS | Status: DC | PRN
  Filled 2020-09-13: qty 2

## 2020-09-13 MED ORDER — BIOTENE DRY MOUTH MT LIQD: 15.0000 mL | OROMUCOSAL | Status: DC | PRN

## 2020-09-13 MED ORDER — ACETAMINOPHEN 325 MG PO TABS: 650.0000 mg | ORAL_TABLET | Freq: Four times a day (QID) | ORAL | Status: DC | PRN

## 2020-09-13 MED ORDER — POLYVINYL ALCOHOL 1.4 % OP SOLN
1.0000 [drp] | Freq: Four times a day (QID) | OPHTHALMIC | Status: DC | PRN
  Filled 2020-09-13: qty 15

## 2020-09-13 MED ORDER — HALOPERIDOL LACTATE 2 MG/ML PO CONC
0.5000 mg | ORAL | Status: DC | PRN
  Filled 2020-09-13: qty 0.3

## 2020-09-13 MED ORDER — GLYCOPYRROLATE 1 MG PO TABS
1.0000 mg | ORAL_TABLET | ORAL | Status: DC | PRN
  Filled 2020-09-13: qty 1

## 2020-09-13 MED ORDER — HALOPERIDOL 0.5 MG PO TABS
0.5000 mg | ORAL_TABLET | ORAL | Status: DC | PRN
  Filled 2020-09-13: qty 1

## 2020-09-13 MED ORDER — ONDANSETRON 4 MG PO TBDP: 4.0000 mg | ORAL_TABLET | Freq: Four times a day (QID) | ORAL | Status: DC | PRN

## 2020-09-13 MED ORDER — ASPIRIN 300 MG RE SUPP
300.0000 mg | Freq: Every day | RECTAL | Status: DC
  Filled 2020-09-13: qty 1

## 2020-09-13 MED ORDER — MORPHINE SULFATE (PF) 2 MG/ML IV SOLN
2.0000 mg | INTRAVENOUS | Status: DC
  Administered 2020-09-13: 2 mg via INTRAVENOUS

## 2020-09-13 MED ORDER — GLYCOPYRROLATE 0.2 MG/ML IJ SOLN
0.2000 mg | INTRAMUSCULAR | Status: DC | PRN
  Filled 2020-09-13: qty 1

## 2020-09-13 MED ORDER — ACETAMINOPHEN 650 MG RE SUPP: 650.0000 mg | Freq: Four times a day (QID) | RECTAL | Status: DC | PRN

## 2020-09-13 MED ORDER — ONDANSETRON HCL 4 MG/2ML IJ SOLN: 4.0000 mg | Freq: Four times a day (QID) | INTRAMUSCULAR | Status: DC | PRN

## 2020-09-13 MED ORDER — CHLORHEXIDINE GLUCONATE CLOTH 2 % EX PADS
6.0000 | MEDICATED_PAD | Freq: Every day | CUTANEOUS | Status: DC
  Administered 2020-09-13: 6 via TOPICAL

## 2020-09-13 MED ORDER — MORPHINE 100MG IN NS 100ML (1MG/ML) PREMIX INFUSION
2.0000 mg/h | INTRAVENOUS | Status: DC
  Administered 2020-09-13: 2 mg/h via INTRAVENOUS
  Filled 2020-09-13: qty 100

## 2020-09-13 NOTE — Progress Notes (Signed)
[facility] Liaison note:  New referral for [facility] received from Palliative NP Kathie Rhodes. TOC Telford Nab aware. Patient information given to referral. Hospice home eligibility has been confirmed.  Writer met in the room with patient's son, daughter in Sports coach and pastor to initiate  education regarding hospice services, philosophy, team approach to care and current visitation policy with understanding voiced. Questions answered.  Unfortunately [facility] does not have bed availability today.  Hospital care team and family made aware.   Will continue to follow and update regarding bed availability daily. Thank you for the opportunity to be involved in the care of this patient and his family.  Flo Shanks BSN, RN, [place] [phone]

## 2020-09-13 NOTE — Progress Notes (Signed)
Daily Progress Note   Patient Name: Jacob White       Date: 09/13/2020 DOB: 08-Jun-1946  Age: 74 y.o. MRN#: [id number] Attending Physician: Alford Highland, MD Primary Care Physician: Patient, No Pcp Per Admit Date: October 05, 2020  Reason for Consultation/Follow-up: Establishing goals of care  Subjective: Patient unresponsive. Son Jacob White at bedside.   Length of Stay: 3  Current Medications: Scheduled Meds:  . Chlorhexidine Gluconate Cloth  6 each Topical Daily    Continuous Infusions: . morphine 2 mg/hr (09/13/20 1105)    PRN Meds: acetaminophen **OR** acetaminophen, antiseptic oral rinse, glycopyrrolate **OR** glycopyrrolate **OR** glycopyrrolate, haloperidol **OR** haloperidol **OR** haloperidol lactate, morphine, ondansetron **OR** ondansetron (ZOFRAN) IV, polyvinyl alcohol  Physical Exam Constitutional:      Comments: unresponsive  Pulmonary:     Comments: Cheyne stokes breathing pattern noted Skin:    General: Skin is warm and dry.             Vital Signs: BP 130/72 (BP Location: Left Arm)   Pulse 82   Temp 98.2 F (36.8 C) (Oral)   Resp (!) 22   Ht 5\' 6"  (1.676 m)   Wt 51.1 kg   SpO2 93%   BMI 18.18 kg/m  SpO2: SpO2: 93 % O2 Device: O2 Device: Room Air O2 Flow Rate:    Intake/output summary:   Intake/Output Summary (Last 24 hours) at 09/13/2020 1120 Last data filed at 09/13/2020 09/15/2020 Gross per 24 hour  Intake 3758.23 ml  Output 1150 ml  Net 2608.23 ml   LBM:   Baseline Weight: Weight: 54.4 kg Most recent weight: Weight: 51.1 kg       Palliative Assessment/Data: PPS 10%    Flowsheet Rows     Most Recent Value  Intake Tab  Referral Department Hospitalist  Unit at Time of Referral Med/Surg Unit  Palliative Care Primary Diagnosis Neurology  Date Notified  09/11/20  Palliative Care Type New Palliative care  Reason for referral Clarify Goals of Care  Date of Admission 10/05/2020  Date first seen by Palliative Care 09/12/20  # of days Palliative referral response time 1 Day(s)  # of days IP prior to Palliative referral 1  Clinical Assessment  Palliative Performance Scale Score 10%  Psychosocial & Spiritual Assessment  Palliative Care Outcomes  Patient/Family meeting held? Yes  Who was at the meeting? son  Palliative Care Outcomes Clarified goals of care, Provided advance care planning, Provided psychosocial or spiritual support, Changed CPR status      Patient Active Problem List   Diagnosis Date Noted  . 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 Protein-calorie malnutrition, severe 09/12/2020  . Acute metabolic encephalopathy   . Failure to thrive in adult   . Dementia without behavioral disturbance (HCC)   . Essential hypertension   . End of life care   . Palliative care by specialist   . DNR (do not resuscitate)   . AKI (acute kidney injury) (HCC) Oct 05, 2020  . Stroke, acute, thrombotic, within last 8 weeks 06/13/2015  . Left hemiparesis (HCC)   . Poorly controlled type 2 diabetes mellitus (HCC)   . Cerebral thrombosis with cerebral infarction (HCC) 06/11/2015  .  MRSA bacteremia 06/11/2015  . Bacteremia 06/09/2015  . Weakness generalized 06/08/2015  . Confusion 06/08/2015  . Dehydration 06/08/2015  . Diabetes mellitus without complication (HCC) 06/08/2015  . Pain of right upper extremity 06/08/2015    Palliative Care Assessment & Plan   HPI: 74 y.o. male  with past medical history of T2DM, HTN, HLD, ?dementia, and leukemia admitted on 08/19/2020 with poor PO intake, weakness, and weight loss.  Patient has remained lethargic during hospitalization. Unable to tolerate PO intake d/t mental status. Also with AKI. Suspected UTI - started on rocephin. Elevated LFTs and elevated  PSA. Pending MRI of brain and spine. PMT consulted to discuss GOC.   Assessment: Discussed situation with son at bedside. He understands MRI results and discussed comfort measures only with Dr. Renae Gloss. We discussed interventions used to ensure comfort for patient. We discussed signs of distress. Discussed patient nearing end of life - uncertain timing - hours to days likely. Discussed option of transferring to hospice facility. Jacob White called his wife and mother to discuss this option - all would prefer that patient be transferred to hospice facility. We discussed patient may not be able to tolerate transfer - we do risk that patient pass away during transport. Family accept this risk and would like to work towards transfer to hospice facility. We discussed patient may pass away prior to transport and family express understanding.   Recommendations/Plan:  Full comfort measures   Referral to hospice facility - family aware patient may not survive until bed available or may not tolerate transport to facility  Goals of Care and Additional Recommendations:  Limitations on Scope of Treatment: Full Comfort Care  Code Status:  DNR  Prognosis:   Hours - Days  Discharge Planning:  Hospice facility vs hospital death  Care plan was discussed with patient's son and DIL, Dr. Renae Gloss, CSW and hospice liaison  Thank you for allowing the Palliative Medicine Team to assist in the care of this patient.   Total Time 45 minutes Prolonged Time Billed  no       Greater than 50%  of this time was spent counseling and coordinating care related to the above assessment and plan.  Gerlean Ren, DNP, [facility] Palliative Medicine Team Team Phone # [phone]  Pager [phone]

## 2020-09-13 NOTE — Progress Notes (Signed)
SLP Cancellation Note  Patient Details Name: Jacob White MRN: [id number] DOB: [birth year]   Cancelled treatment:       Reason Eval/Treat Not Completed: Medical issues which prohibited therapy   Per chart pt is currently receiving comfort measures. ST will sign off.   Marelly Wehrman B. Dreama Saa M.S., CCC-SLP, [facility] Speech-Language Pathologist Rehabilitation Services Office [phone]    Reuel Derby 09/13/2020, 9:39 AM

## 2020-09-13 NOTE — Progress Notes (Signed)
Patient ID: Jacob White, male   DOB: [birth year], 74 y.o.   MRN: [id number] [facility] PROGRESS NOTE  JANET DECESARE FAO:[id number] DOB: February 05, 1946 DOA: 08/19/2020 PCP: Patient, No Pcp Per  HPI/Subjective: Patient unresponsive to sternal rub.  No corneal reflex.  Pupils 2 mm and fixed.  Objective: Vitals:   09/13/20 0106 09/13/20 0803  BP: 124/80 130/72  Pulse: 74 82  Resp: 18 (!) 22  Temp: 98.9 F (37.2 C) 98.2 F (36.8 C)  SpO2: 93% 93%    Intake/Output Summary (Last 24 hours) at 09/13/2020 0912 Last data filed at 09/13/2020 0110 Gross per 24 hour  Intake 3758.23 ml  Output 550 ml  Net 3208.23 ml   Filed Weights   08/31/2020 1454 09/13/20 0500  Weight: 54.4 kg 51.1 kg    ROS: Review of Systems  Unable to perform ROS: Acuity of condition   Exam: Physical Exam HENT:     Nose: No mucosal edema.     Mouth/Throat:     Pharynx: No oropharyngeal exudate.  Eyes:     General: Lids are normal.     Conjunctiva/sclera: Conjunctivae normal.     Comments: Pupils 2 mm fixed.  Pulmonary:     Breath sounds: Examination of the right-lower field reveals decreased breath sounds. Examination of the left-lower field reveals decreased breath sounds. Decreased breath sounds present. No wheezing, rhonchi or rales.  Abdominal:     Palpations: Abdomen is soft.     Tenderness: There is no abdominal tenderness.  Musculoskeletal:     Right ankle: No swelling.     Left ankle: No swelling.  Skin:    General: Skin is warm.     Findings: No rash.  Neurological:     Mental Status: He is unresponsive.     Comments: No corneal reflex bilaterally Pupils 2 mm fixed       Data Reviewed: Basic Metabolic Panel: Recent Labs  Lab 08/31/2020 1457 09/11/20 0513 09/12/20 0642 09/13/20 0530  NA 138 141 143 150*  K 4.3 4.6 4.3 4.3  CL 97* 100 104 111  CO2 26 27 19* 17*  GLUCOSE 341* 166* 171* 161*  BUN 107* 84* 56* 52*  CREATININE 2.92* 2.05* 1.95* 1.88*  CALCIUM 8.8* 8.6* 8.6* 8.5*   PHOS  --   --  3.7  --    Liver Function Tests: Recent Labs  Lab 09/03/2020 1457 09/12/20 0642  AST 39 109*  ALT 22 55*  ALKPHOS 94 80  BILITOT 1.2 1.7*  PROT 7.2 6.3*  ALBUMIN 3.3* 2.9*  2.9*   CBC: Recent Labs  Lab 09/12/2020 1457 09/11/20 0513 09/12/20 0642  WBC 8.4 7.9 8.9  HGB 17.0 16.4 16.1  HCT 51.0 47.1 46.4  MCV 87.0 85.2 85.8  PLT 152 118* 121*   BNP (last 3 results) Recent Labs    08/26/2020 1457  BNP 41.7    CBG: Recent Labs  Lab 09/12/20 0825 09/12/20 1225 09/12/20 1614 09/12/20 2209 09/13/20 0805  GLUCAP 164* 169* 172* 143* 144*    Recent Results (from the past 240 hour(s))  Respiratory Panel by RT PCR (Flu A&B, Covid) - Nasopharyngeal Swab     Status: None   Collection Time: 08/18/2020  3:29 PM   Specimen: Nasopharyngeal Swab  Result Value Ref Range Status   SARS Coronavirus 2 by RT PCR NEGATIVE NEGATIVE Final    Comment: (NOTE) SARS-CoV-2 target nucleic acids are NOT DETECTED.  The SARS-CoV-2 RNA is generally detectable in upper respiratoy specimens  during the acute phase of infection. The lowest concentration of SARS-CoV-2 viral copies this assay can detect is 131 copies/mL. A negative result does not preclude SARS-Cov-2 infection and should not be used as the sole basis for treatment or other patient management decisions. A negative result may occur with  improper specimen collection/handling, submission of specimen other than nasopharyngeal swab, presence of viral mutation(s) within the areas targeted by this assay, and inadequate number of viral copies (<131 copies/mL). A negative result must be combined with clinical observations, patient history, and epidemiological information. The expected result is Negative.  Fact Sheet for Patients:  https://www.moore.com/  Fact Sheet for Healthcare Providers:  https://www.young.biz/  This test is no t yet approved or cleared by the [place] FDA and   has been authorized for detection and/or diagnosis of SARS-CoV-2 by FDA under an Emergency Use Authorization (EUA). This EUA will remain  in effect (meaning this test can be used) for the duration of the COVID-19 declaration under Section 564(b)(1) of the Act, 21 U.S.C. section 360bbb-3(b)(1), unless the authorization is terminated or revoked sooner.     Influenza A by PCR NEGATIVE NEGATIVE Final   Influenza B by PCR NEGATIVE NEGATIVE Final    Comment: (NOTE) The Xpert Xpress SARS-CoV-2/FLU/RSV assay is intended as an aid in  the diagnosis of influenza from Nasopharyngeal swab specimens and  should not be used as a sole basis for treatment. Nasal washings and  aspirates are unacceptable for Xpert Xpress SARS-CoV-2/FLU/RSV  testing.  Fact Sheet for Patients: https://www.moore.com/  Fact Sheet for Healthcare Providers: https://www.young.biz/  This test is not yet approved or cleared by the [place] FDA and  has been authorized for detection and/or diagnosis of SARS-CoV-2 by  FDA under an Emergency Use Authorization (EUA). This EUA will remain  in effect (meaning this test can be used) for the duration of the  Covid-19 declaration under Section 564(b)(1) of the Act, 21  U.S.C. section 360bbb-3(b)(1), unless the authorization is  terminated or revoked. Performed at [facility], [address] [place], [place] [address]   Blood culture (routine single)     Status: None (Preliminary result)   Collection Time: 08/30/2020  3:29 PM   Specimen: BLOOD  Result Value Ref Range Status   Specimen Description BLOOD BLOOD LEFT FOREARM  Final   Special Requests   Final    BOTTLES DRAWN AEROBIC AND ANAEROBIC Blood Culture results may not be optimal due to an inadequate volume of blood received in culture bottles   Culture   Final    NO GROWTH 3 DAYS Performed at [facility], [address] [place], [place] [address]    Report  Status PENDING  Incomplete  Urine culture     Status: Abnormal   Collection Time: 09/11/2020  6:26 PM   Specimen: In/Out Cath Urine  Result Value Ref Range Status   Specimen Description   Final    IN/OUT CATH URINE Performed at [facility], [address], [place], [place] [address]    Special Requests   Final    NONE Performed at [facility], [address], [place], [place] [address]    Culture MULTIPLE SPECIES PRESENT, SUGGEST RECOLLECTION (A)  Final   Report Status 09/11/2020 FINAL  Final     Studies: CT ABDOMEN PELVIS WO CONTRAST  Result Date: 09/11/2020 CLINICAL DATA:  Unintended weight loss EXAM: CT CHEST, ABDOMEN AND PELVIS WITHOUT CONTRAST TECHNIQUE: Multidetector CT imaging of the chest, abdomen and  pelvis was performed following the standard protocol without IV contrast. COMPARISON:  01/03/2013, chest x-ray from 09/19/20 FINDINGS: CT CHEST FINDINGS Cardiovascular: Diffuse atherosclerotic calcifications are noted in the thoracic aorta. No definitive aneurysmal dilatation is noted. No cardiac enlargement is seen. Coronary calcifications are noted. No pericardial effusion is seen. Mediastinum/Nodes: Thoracic inlet is within normal limits. No hilar or mediastinal adenopathy is noted. The esophagus as visualized is within normal limits. Lungs/Pleura: Lungs are well aerated bilaterally. Mild emphysematous changes are noted. No focal confluent infiltrate or sizable effusion is seen. No parenchymal nodule is noted. Musculoskeletal: Degenerative changes of the thoracic spine are seen. No acute bony abnormality is noted. CT ABDOMEN PELVIS FINDINGS Hepatobiliary: Dependent gallstones are noted. No other focal abnormality is noted or gallbladder. Pancreas: Fatty interdigitation of the pancreas is noted. Spleen: Normal in size without focal abnormality. Adrenals/Urinary Tract: Adrenal glands are within normal limits. Kidneys demonstrate no mass lesion or hydronephrosis. No  renal calculi are seen. Bladder is well distended although a Foley catheter is noted in place. Air is noted likely related to the recent instrumentation. Stomach/Bowel: Diverticular change of the colon is noted without evidence of diverticulitis. No definitive mass lesion or obstructive change is seen. The appendix is within normal limits. No small bowel or gastric abnormality is seen. Vascular/Lymphatic: Aortic atherosclerosis. No enlarged abdominal or pelvic lymph nodes. Reproductive: Prostate is unremarkable. Other: No abdominal wall hernia or abnormality. No abdominopelvic ascites. Musculoskeletal: No acute or significant osseous findings. IMPRESSION: CT of the chest: No acute abnormality noted. Aortic Atherosclerosis (ICD10-I70.0) and Emphysema (ICD10-J43.9). CT of the abdomen and pelvis: Cholelithiasis without complicating factors. Diverticulosis without diverticulitis. Electronically Signed   By: Alcide Clever M.D.   On: 09/11/2020 14:40   CT CHEST WO CONTRAST  Result Date: 09/11/2020 CLINICAL DATA:  Unintended weight loss EXAM: CT CHEST, ABDOMEN AND PELVIS WITHOUT CONTRAST TECHNIQUE: Multidetector CT imaging of the chest, abdomen and pelvis was performed following the standard protocol without IV contrast. COMPARISON:  01/03/2013, chest x-ray from 2020/09/19 FINDINGS: CT CHEST FINDINGS Cardiovascular: Diffuse atherosclerotic calcifications are noted in the thoracic aorta. No definitive aneurysmal dilatation is noted. No cardiac enlargement is seen. Coronary calcifications are noted. No pericardial effusion is seen. Mediastinum/Nodes: Thoracic inlet is within normal limits. No hilar or mediastinal adenopathy is noted. The esophagus as visualized is within normal limits. Lungs/Pleura: Lungs are well aerated bilaterally. Mild emphysematous changes are noted. No focal confluent infiltrate or sizable effusion is seen. No parenchymal nodule is noted. Musculoskeletal: Degenerative changes of the thoracic spine  are seen. No acute bony abnormality is noted. CT ABDOMEN PELVIS FINDINGS Hepatobiliary: Dependent gallstones are noted. No other focal abnormality is noted or gallbladder. Pancreas: Fatty interdigitation of the pancreas is noted. Spleen: Normal in size without focal abnormality. Adrenals/Urinary Tract: Adrenal glands are within normal limits. Kidneys demonstrate no mass lesion or hydronephrosis. No renal calculi are seen. Bladder is well distended although a Foley catheter is noted in place. Air is noted likely related to the recent instrumentation. Stomach/Bowel: Diverticular change of the colon is noted without evidence of diverticulitis. No definitive mass lesion or obstructive change is seen. The appendix is within normal limits. No small bowel or gastric abnormality is seen. Vascular/Lymphatic: Aortic atherosclerosis. No enlarged abdominal or pelvic lymph nodes. Reproductive: Prostate is unremarkable. Other: No abdominal wall hernia or abnormality. No abdominopelvic ascites. Musculoskeletal: No acute or significant osseous findings. IMPRESSION: CT of the chest: No acute abnormality noted. Aortic Atherosclerosis (ICD10-I70.0) and Emphysema (ICD10-J43.9). CT of the abdomen  and pelvis: Cholelithiasis without complicating factors. Diverticulosis without diverticulitis. Electronically Signed   By: Alcide Clever M.D.   On: 09/11/2020 14:40   MR BRAIN WO CONTRAST  Result Date: 09/12/2020 CLINICAL DATA:  Weakness with inability to walk. EXAM: MRI HEAD WITHOUT CONTRAST MRI CERVICAL SPINE WITHOUT CONTRAST TECHNIQUE: Multiplanar, multiecho pulse sequences of the brain and surrounding structures, and cervical spine, to include the craniocervical junction and cervicothoracic junction, were obtained without intravenous contrast. COMPARISON:  None. FINDINGS: MRI HEAD FINDINGS Brain: There are multiple bilateral acute infarcts affecting both MCA territories and the left PCA territory. The largest area of ischemia is in the  left frontal operculum extending to the insula. This area of infarction includes the left precentral gyrus. There are multiple old cerebellar infarcts and an old left parietal infarct. Diffuse confluent hyperintense T2-weighted signal within the periventricular, deep and juxtacortical white matter. Advanced atrophy. There is petechial hemorrhage within the left frontal infarct. There is hemosiderin deposition over the posterior right hemisphere and posterior left parietal lobe. There are multiple chronic microhemorrhages of the pons and cerebellum. Vascular: Normal flow voids. Skull and upper cervical spine: Normal marrow signal. Sinuses/Orbits: Negative. Other: None. MRI CERVICAL SPINE FINDINGS Alignment: Physiologic. Vertebrae: Mild left C3-4 facet edema.  No focal marrow lesion. Cord: Normal Posterior Fossa, vertebral arteries, paraspinal tissues: Multiple old cerebellar infarcts. Flow voids are normal. Disc levels: C1-2: Unremarkable. C2-3: Normal disc space and facet joints. There is no spinal canal stenosis. No neural foraminal stenosis. C3-4: Small disc bulge. Left facet hypertrophy with fluid in the facet joint. No spinal canal stenosis. Mild left neural foraminal stenosis. C4-5: Small disc bulge with endplate spurring. There is no spinal canal stenosis. No neural foraminal stenosis. C5-6: Small disc bulge. There is no spinal canal stenosis. No neural foraminal stenosis. C6-7: Small disc bulge. There is no spinal canal stenosis. No neural foraminal stenosis. C7-T1: Normal disc space and facet joints. There is no spinal canal stenosis. No neural foraminal stenosis. IMPRESSION: 1. Multiple bilateral acute infarcts affecting both MCA territories and the left PCA territory. The largest area of ischemia is in the left frontal operculum extending to the insula and precentral gyrus. 2. Petechial hemorrhage within the left frontal infarct. Heidelberg classification 1b: HI2, confluent petechiae, no mass effect. 3.  Advanced atrophy with severe chronic ischemic white matter changes. 4. Multiple old infarcts. 5. No cervical spinal canal stenosis. Moderate facet arthrosis at left C3-4 may be a source of local neck pain. Electronically Signed   By: Deatra Robinson M.D.   On: 09/12/2020 22:03   MR CERVICAL SPINE WO CONTRAST  Result Date: 09/12/2020 CLINICAL DATA:  Weakness with inability to walk. EXAM: MRI HEAD WITHOUT CONTRAST MRI CERVICAL SPINE WITHOUT CONTRAST TECHNIQUE: Multiplanar, multiecho pulse sequences of the brain and surrounding structures, and cervical spine, to include the craniocervical junction and cervicothoracic junction, were obtained without intravenous contrast. COMPARISON:  None. FINDINGS: MRI HEAD FINDINGS Brain: There are multiple bilateral acute infarcts affecting both MCA territories and the left PCA territory. The largest area of ischemia is in the left frontal operculum extending to the insula. This area of infarction includes the left precentral gyrus. There are multiple old cerebellar infarcts and an old left parietal infarct. Diffuse confluent hyperintense T2-weighted signal within the periventricular, deep and juxtacortical white matter. Advanced atrophy. There is petechial hemorrhage within the left frontal infarct. There is hemosiderin deposition over the posterior right hemisphere and posterior left parietal lobe. There are multiple chronic microhemorrhages of the pons  and cerebellum. Vascular: Normal flow voids. Skull and upper cervical spine: Normal marrow signal. Sinuses/Orbits: Negative. Other: None. MRI CERVICAL SPINE FINDINGS Alignment: Physiologic. Vertebrae: Mild left C3-4 facet edema.  No focal marrow lesion. Cord: Normal Posterior Fossa, vertebral arteries, paraspinal tissues: Multiple old cerebellar infarcts. Flow voids are normal. Disc levels: C1-2: Unremarkable. C2-3: Normal disc space and facet joints. There is no spinal canal stenosis. No neural foraminal stenosis. C3-4: Small  disc bulge. Left facet hypertrophy with fluid in the facet joint. No spinal canal stenosis. Mild left neural foraminal stenosis. C4-5: Small disc bulge with endplate spurring. There is no spinal canal stenosis. No neural foraminal stenosis. C5-6: Small disc bulge. There is no spinal canal stenosis. No neural foraminal stenosis. C6-7: Small disc bulge. There is no spinal canal stenosis. No neural foraminal stenosis. C7-T1: Normal disc space and facet joints. There is no spinal canal stenosis. No neural foraminal stenosis. IMPRESSION: 1. Multiple bilateral acute infarcts affecting both MCA territories and the left PCA territory. The largest area of ischemia is in the left frontal operculum extending to the insula and precentral gyrus. 2. Petechial hemorrhage within the left frontal infarct. Heidelberg classification 1b: HI2, confluent petechiae, no mass effect. 3. Advanced atrophy with severe chronic ischemic white matter changes. 4. Multiple old infarcts. 5. No cervical spinal canal stenosis. Moderate facet arthrosis at left C3-4 may be a source of local neck pain. Electronically Signed   By: Deatra RobinsonKevin  Herman M.D.   On: 09/12/2020 22:03    Scheduled Meds: . Chlorhexidine Gluconate Cloth  6 each Topical Daily   Continuous Infusions: . morphine      Assessment/Plan:  1. End-of-life care.  Discontinue most oral medications and use comfort care measures.  Start morphine drip.  Updated palliative care team.  Patient already a DNR. 2. 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 3. Acute metabolic encephalopathy.  Patient's mental status worse today.  Unresponsive to sternal rub and no corneal reflex and pupils fixed. 4. Acute kidney injury 5. Severe protein calorie malnutrition and failure to thrive 6. Underlying dementia 7. Hypothyroidism unspecified 8. Essential hypertension 9. Elevated liver function test 10. Slightly elevated PSA      Code Status:     Code Status  Orders  (From admission, onward)         Start     Ordered   09/13/20 0908  Do not attempt resuscitation (DNR)  Continuous       Question Answer Comment  In the event of cardiac or respiratory ARREST Do not call a "code blue"   In the event of cardiac or respiratory ARREST Do not perform Intubation, CPR, defibrillation or ACLS   In the event of cardiac or respiratory ARREST Use medication by any route, position, wound care, and other measures to relive pain and suffering. May use oxygen, suction and manual treatment of airway obstruction as needed for comfort.   Comments nurse may pronounce      09/13/20 0908        Code Status History    Date Active Date Inactive Code Status Order ID Comments User Context   09/12/2020 1517 09/13/2020 0908 DNR [id number]  Joylene DraftShaffer, Shae Lee N, NP Inpatient   02/12/2020 1806 09/12/2020 1517 Full Code [id number]  Arnetha CourserAmin, Sumayya, MD ED   06/13/2015 1628 06/20/2015 1723 Full Code [id number]  Jacquelynn CreeLove, Pamela S, PA-C Inpatient   06/13/2015 1628 06/13/2015 1628 Full Code [id number]  Jacquelynn CreeLove, Pamela S, PA-C Inpatient   06/08/2015  2034 06/13/2015 1628 Full Code [id number]  Levie Heritage, DO Inpatient   Advance Care Planning Activity     Family Communication: Spoke with son at the bedside and he spoke with the rest of the family and we decided on comfort care measures at this time. Disposition Plan: Status is: Inpatient  Dispo: The patient is from: Home              Anticipated d/c is to: Comfort care measures              Anticipated d/c date is: Comfort care measures              Patient currently starting comfort care measures here in the hospital  Time spent: 32 minutes in coordination of care and speaking with neurology and palliative care.  I canceled neurology consultation with making comfort care measures.  Mayukha Symmonds [employer]  [facility] [employer]

## 2020-09-13 NOTE — Care Management Important Message (Signed)
Important Message  Patient Details  Name: Jacob White MRN: [id number] Date of Birth: 10-24-1946   Medicare Important Message Given:  Other (see comment)  Patient on comfort care measures.  No Medicare IM given out of respect for patient and family.   Johnell Comings 09/13/2020, 9:36 AM

## 2020-09-13 NOTE — Progress Notes (Signed)
Ch visited with Pt's son Raiford Noble after being paged about Pt being put on comfort care. Raiford Noble was tearful and experiencing anticipatory grief. Rick let Ch know about the multiple strokes his dad(Pt) had had.Ch provided pastoral presence and support. Rick requested prayer. Ch prayed with him. Ch asked if any other family members are coming, and he said that his wife will be joining him soon.Ch let him know that chaplains are available anytime for support. He was grateful for visit.

## 2020-09-14 DEATH — deceased

## 2020-09-15 LAB — CULTURE, BLOOD (SINGLE): Culture: NO GROWTH

## 2020-09-20 ENCOUNTER — Ambulatory Visit: Payer: Self-pay | Admitting: Urology

## 2020-09-20 ENCOUNTER — Ambulatory Visit: Payer: Self-pay | Admitting: Physician Assistant

## 2020-10-15 NOTE — Progress Notes (Signed)
[facility] hospital liaison note:  Writer to the room following patient's death to provide emotional support to family. Family appreciative of care provided. Hospice home and referral notified. Dayna Barker BSN, RN, [facility] [employer], [employer]

## 2020-10-15 NOTE — Progress Notes (Addendum)
[facility] hospital Liaison note:  Visit made. Patient's son Jacob White and daughter in law Jacob White at bedside. Patient remains on a continuous morphine drip at 2 mg/hr. Mouth care provided. No signs of pain or distress noted. Patient repositioned with staff RN Victorino Dike. Emotional support provided to family.  [facility] does not have any bed availability today. Family and hospital care team updated. Hospice home staff with follow up with the weekend TOC regarding bed availability. Thank you.  Dayna Barker BSN, RN, [facility] [employer] [phone]

## 2020-10-15 NOTE — Progress Notes (Signed)
Patient ID: Jacob White, male   DOB: 09/30/1946, 74 y.o.   MRN: [id number] [facility] PROGRESS NOTE  Jacob White:[id number] DOB: 08/20/1946 DOA: 09/06/2020 PCP: Patient, No Pcp Per  HPI/Subjective: Patient made comfort care measures yesterday.  Currently breathing comfortably on morphine drip.  Unresponsive to sternal rub.  Objective: Vitals:   09/13/20 0803 09/13/20 2347  BP: 130/72 95/62  Pulse: 82 87  Resp: (!) 22 20  Temp: 98.2 F (36.8 C) 98.7 F (37.1 C)  SpO2: 93% (!) 85%    Intake/Output Summary (Last 24 hours) at 2020/10/11 0844 Last data filed at October 11, 2020 0100 Gross per 24 hour  Intake 29.81 ml  Output 975 ml  Net -945.19 ml   Filed Weights   09/12/2020 1454 09/13/20 0500  Weight: 54.4 kg 51.1 kg    ROS: Review of Systems  Unable to perform ROS: Acuity of condition   Exam: Physical Exam HENT:     Head: Normocephalic.     Mouth/Throat:     Mouth: Mucous membranes are dry.  Eyes:     General: Lids are normal.     Conjunctiva/sclera: Conjunctivae normal.     Comments: Pupils pinpoint  Cardiovascular:     Rate and Rhythm: Normal rate and regular rhythm.     Heart sounds: Normal heart sounds, S1 normal and S2 normal.  Pulmonary:     Breath sounds: Examination of the right-lower field reveals decreased breath sounds. Examination of the left-lower field reveals decreased breath sounds. Decreased breath sounds present. No wheezing, rhonchi or rales.  Abdominal:     Palpations: Abdomen is soft.     Tenderness: There is no abdominal tenderness.  Musculoskeletal:     Right lower leg: No swelling.     Left lower leg: No swelling.  Skin:    General: Skin is warm.     Findings: No rash.  Neurological:     Mental Status: He is unresponsive.       Data Reviewed: Basic Metabolic Panel: Recent Labs  Lab 08/19/2020 1457 09/11/20 0513 09/12/20 0642 09/13/20 0530  NA 138 141 143 150*  K 4.3 4.6 4.3 4.3  CL 97* 100 104 111  CO2 26 27 19* 17*   GLUCOSE 341* 166* 171* 161*  BUN 107* 84* 56* 52*  CREATININE 2.92* 2.05* 1.95* 1.88*  CALCIUM 8.8* 8.6* 8.6* 8.5*  PHOS  --   --  3.7  --    Liver Function Tests: Recent Labs  Lab 08/30/2020 1457 09/12/20 0642  AST 39 109*  ALT 22 55*  ALKPHOS 94 80  BILITOT 1.2 1.7*  PROT 7.2 6.3*  ALBUMIN 3.3* 2.9*  2.9*   CBC: Recent Labs  Lab 09/09/2020 1457 09/11/20 0513 09/12/20 0642  WBC 8.4 7.9 8.9  HGB 17.0 16.4 16.1  HCT 51.0 47.1 46.4  MCV 87.0 85.2 85.8  PLT 152 118* 121*   BNP (last 3 results) Recent Labs    08/26/2020 1457  BNP 41.7     CBG: Recent Labs  Lab 09/12/20 0825 09/12/20 1225 09/12/20 1614 09/12/20 2209 09/13/20 0805  GLUCAP 164* 169* 172* 143* 144*    Recent Results (from the past 240 hour(s))  Respiratory Panel by RT PCR (Flu A&B, Covid) - Nasopharyngeal Swab     Status: None   Collection Time: 08/29/2020  3:29 PM   Specimen: Nasopharyngeal Swab  Result Value Ref Range Status   SARS Coronavirus 2 by RT PCR NEGATIVE NEGATIVE Final    Comment: (  NOTE) SARS-CoV-2 target nucleic acids are NOT DETECTED.  The SARS-CoV-2 RNA is generally detectable in upper respiratoy specimens during the acute phase of infection. The lowest concentration of SARS-CoV-2 viral copies this assay can detect is 131 copies/mL. A negative result does not preclude SARS-Cov-2 infection and should not be used as the sole basis for treatment or other patient management decisions. A negative result may occur with  improper specimen collection/handling, submission of specimen other than nasopharyngeal swab, presence of viral mutation(s) within the areas targeted by this assay, and inadequate number of viral copies (<131 copies/mL). A negative result must be combined with clinical observations, patient history, and epidemiological information. The expected result is Negative.  Fact Sheet for Patients:  https://www.moore.com/  Fact Sheet for Healthcare  Providers:  https://www.young.biz/  This test is no t yet approved or cleared by the [place] FDA and  has been authorized for detection and/or diagnosis of SARS-CoV-2 by FDA under an Emergency Use Authorization (EUA). This EUA will remain  in effect (meaning this test can be used) for the duration of the COVID-19 declaration under Section 564(b)(1) of the Act, 21 U.S.C. section 360bbb-3(b)(1), unless the authorization is terminated or revoked sooner.     Influenza A by PCR NEGATIVE NEGATIVE Final   Influenza B by PCR NEGATIVE NEGATIVE Final    Comment: (NOTE) The Xpert Xpress SARS-CoV-2/FLU/RSV assay is intended as an aid in  the diagnosis of influenza from Nasopharyngeal swab specimens and  should not be used as a sole basis for treatment. Nasal washings and  aspirates are unacceptable for Xpert Xpress SARS-CoV-2/FLU/RSV  testing.  Fact Sheet for Patients: https://www.moore.com/  Fact Sheet for Healthcare Providers: https://www.young.biz/  This test is not yet approved or cleared by the [place] FDA and  has been authorized for detection and/or diagnosis of SARS-CoV-2 by  FDA under an Emergency Use Authorization (EUA). This EUA will remain  in effect (meaning this test can be used) for the duration of the  Covid-19 declaration under Section 564(b)(1) of the Act, 21  U.S.C. section 360bbb-3(b)(1), unless the authorization is  terminated or revoked. Performed at [facility], [address] [place], [place] [address]   Blood culture (routine single)     Status: None (Preliminary result)   Collection Time: 08/27/2020  3:29 PM   Specimen: BLOOD  Result Value Ref Range Status   Specimen Description BLOOD BLOOD LEFT FOREARM  Final   Special Requests   Final    BOTTLES DRAWN AEROBIC AND ANAEROBIC Blood Culture results may not be optimal due to an inadequate volume of blood received in culture bottles    Culture   Final    NO GROWTH 4 DAYS Performed at [facility], [address], [place], [place] [address]    Report Status PENDING  Incomplete  Urine culture     Status: Abnormal   Collection Time: 08/25/2020  6:26 PM   Specimen: In/Out Cath Urine  Result Value Ref Range Status   Specimen Description   Final    IN/OUT CATH URINE Performed at [facility], [address] [place], [place] [address]    Special Requests   Final    NONE Performed at [facility], [address], [place], [place] [address]    Culture MULTIPLE SPECIES PRESENT, SUGGEST RECOLLECTION (A)  Final   Report Status 09/11/2020 FINAL  Final     Studies: MR BRAIN WO CONTRAST  Result Date: 09/12/2020 CLINICAL DATA:  Weakness with  inability to walk. EXAM: MRI HEAD WITHOUT CONTRAST MRI CERVICAL SPINE WITHOUT CONTRAST TECHNIQUE: Multiplanar, multiecho pulse sequences of the brain and surrounding structures, and cervical spine, to include the craniocervical junction and cervicothoracic junction, were obtained without intravenous contrast. COMPARISON:  None. FINDINGS: MRI HEAD FINDINGS Brain: There are multiple bilateral acute infarcts affecting both MCA territories and the left PCA territory. The largest area of ischemia is in the left frontal operculum extending to the insula. This area of infarction includes the left precentral gyrus. There are multiple old cerebellar infarcts and an old left parietal infarct. Diffuse confluent hyperintense T2-weighted signal within the periventricular, deep and juxtacortical white matter. Advanced atrophy. There is petechial hemorrhage within the left frontal infarct. There is hemosiderin deposition over the posterior right hemisphere and posterior left parietal lobe. There are multiple chronic microhemorrhages of the pons and cerebellum. Vascular: Normal flow voids. Skull and upper cervical spine: Normal marrow signal. Sinuses/Orbits: Negative. Other: None. MRI  CERVICAL SPINE FINDINGS Alignment: Physiologic. Vertebrae: Mild left C3-4 facet edema.  No focal marrow lesion. Cord: Normal Posterior Fossa, vertebral arteries, paraspinal tissues: Multiple old cerebellar infarcts. Flow voids are normal. Disc levels: C1-2: Unremarkable. C2-3: Normal disc space and facet joints. There is no spinal canal stenosis. No neural foraminal stenosis. C3-4: Small disc bulge. Left facet hypertrophy with fluid in the facet joint. No spinal canal stenosis. Mild left neural foraminal stenosis. C4-5: Small disc bulge with endplate spurring. There is no spinal canal stenosis. No neural foraminal stenosis. C5-6: Small disc bulge. There is no spinal canal stenosis. No neural foraminal stenosis. C6-7: Small disc bulge. There is no spinal canal stenosis. No neural foraminal stenosis. C7-T1: Normal disc space and facet joints. There is no spinal canal stenosis. No neural foraminal stenosis. IMPRESSION: 1. Multiple bilateral acute infarcts affecting both MCA territories and the left PCA territory. The largest area of ischemia is in the left frontal operculum extending to the insula and precentral gyrus. 2. Petechial hemorrhage within the left frontal infarct. Heidelberg classification 1b: HI2, confluent petechiae, no mass effect. 3. Advanced atrophy with severe chronic ischemic white matter changes. 4. Multiple old infarcts. 5. No cervical spinal canal stenosis. Moderate facet arthrosis at left C3-4 may be a source of local neck pain. Electronically Signed   By: Deatra Robinson M.D.   On: 09/12/2020 22:03   MR CERVICAL SPINE WO CONTRAST  Result Date: 09/12/2020 CLINICAL DATA:  Weakness with inability to walk. EXAM: MRI HEAD WITHOUT CONTRAST MRI CERVICAL SPINE WITHOUT CONTRAST TECHNIQUE: Multiplanar, multiecho pulse sequences of the brain and surrounding structures, and cervical spine, to include the craniocervical junction and cervicothoracic junction, were obtained without intravenous contrast.  COMPARISON:  None. FINDINGS: MRI HEAD FINDINGS Brain: There are multiple bilateral acute infarcts affecting both MCA territories and the left PCA territory. The largest area of ischemia is in the left frontal operculum extending to the insula. This area of infarction includes the left precentral gyrus. There are multiple old cerebellar infarcts and an old left parietal infarct. Diffuse confluent hyperintense T2-weighted signal within the periventricular, deep and juxtacortical white matter. Advanced atrophy. There is petechial hemorrhage within the left frontal infarct. There is hemosiderin deposition over the posterior right hemisphere and posterior left parietal lobe. There are multiple chronic microhemorrhages of the pons and cerebellum. Vascular: Normal flow voids. Skull and upper cervical spine: Normal marrow signal. Sinuses/Orbits: Negative. Other: None. MRI CERVICAL SPINE FINDINGS Alignment: Physiologic. Vertebrae: Mild left C3-4 facet edema.  No focal marrow lesion. Cord: Normal Posterior  Fossa, vertebral arteries, paraspinal tissues: Multiple old cerebellar infarcts. Flow voids are normal. Disc levels: C1-2: Unremarkable. C2-3: Normal disc space and facet joints. There is no spinal canal stenosis. No neural foraminal stenosis. C3-4: Small disc bulge. Left facet hypertrophy with fluid in the facet joint. No spinal canal stenosis. Mild left neural foraminal stenosis. C4-5: Small disc bulge with endplate spurring. There is no spinal canal stenosis. No neural foraminal stenosis. C5-6: Small disc bulge. There is no spinal canal stenosis. No neural foraminal stenosis. C6-7: Small disc bulge. There is no spinal canal stenosis. No neural foraminal stenosis. C7-T1: Normal disc space and facet joints. There is no spinal canal stenosis. No neural foraminal stenosis. IMPRESSION: 1. Multiple bilateral acute infarcts affecting both MCA territories and the left PCA territory. The largest area of ischemia is in the left  frontal operculum extending to the insula and precentral gyrus. 2. Petechial hemorrhage within the left frontal infarct. Heidelberg classification 1b: HI2, confluent petechiae, no mass effect. 3. Advanced atrophy with severe chronic ischemic white matter changes. 4. Multiple old infarcts. 5. No cervical spinal canal stenosis. Moderate facet arthrosis at left C3-4 may be a source of local neck pain. Electronically Signed   By: Deatra RobinsonKevin  Herman M.D.   On: 09/12/2020 22:03    Scheduled Meds: . Chlorhexidine Gluconate Cloth  6 each Topical Daily   Continuous Infusions: . morphine 2 mg/hr (10/11/2020 0100)    Assessment/Plan:  1. End-of-life care.  Continue morphine drip.  Palliative care and hospice also following.  Patient is a DO NOT RESUSCITATE.  Comfort care measures only. 2. 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 Acute metabolic encephalopathy.  Mental status was impaired on admission and has not improved.  Likely secondary to strokes. 4. Acute kidney injury 5. Severe protein calorie malnutrition failure to thrive 6. Underlying dementia 7. Hypothyroidism unspecified 8. Essential hypertension 9. Elevated liver function test 10. Slightly elevated PSA     Code Status:     Code Status Orders  (From admission, onward)         Start     Ordered   09/13/20 0908  Do not attempt resuscitation (DNR)  Continuous       Question Answer Comment  In the event of cardiac or respiratory ARREST Do not call a "code blue"   In the event of cardiac or respiratory ARREST Do not perform Intubation, CPR, defibrillation or ACLS   In the event of cardiac or respiratory ARREST Use medication by any route, position, wound care, and other measures to relive pain and suffering. May use oxygen, suction and manual treatment of airway obstruction as needed for comfort.   Comments nurse may pronounce      09/13/20 0908        Code Status History    Date  Active Date Inactive Code Status Order ID Comments User Context   09/12/2020 1517 09/13/2020 0908 DNR [id number]  Joylene DraftShaffer, Shae Lee N, NP Inpatient   2020/08/26 1806 09/12/2020 1517 Full Code [id number]  Arnetha CourserAmin, Sumayya, MD ED   06/13/2015 1628 06/20/2015 1723 Full Code [id number]  Jacquelynn CreeLove, Pamela S, PA-C Inpatient   06/13/2015 1628 06/13/2015 1628 Full Code [id number]  Jerene PitchLove, Pamela S, PA-C Inpatient   06/08/2015 2034 06/13/2015 1628 Full Code [id number]  Levie HeritageStinson, Jacob J, DO Inpatient   Advance Care Planning Activity     Family Communication: Spoke with family on the phone Disposition Plan: Status is: Inpatient  Dispo: The patient  is from: Home              Anticipated d/c is to: Potential hospice home versus comfort care measures here in the hospital              Anticipated d/c date is: To be determined based on hospice home availability versus comfort care measures here in the hospital              Patient currently receiving comfort care measures on morphine drip  Time spent: 24 minutes  [employer]

## 2020-10-15 NOTE — Death Summary Note (Signed)
DEATH SUMMARY   Patient Details  Name: Jacob White MRN: [id number] DOB: 1946-01-17  Admission/Discharge Information   Admit Date:  Sep 15, 2020  Date of Death: Date of Death: 09-19-20  Time of Death: Time of Death: 03/30/1457  Length of Stay: 4  Referring Physician: Patient, No Pcp Per   Reason(s) for Hospitalization  Came in with acute metabolic encephalopathy  Diagnoses  Preliminary cause of death:  Secondary Diagnoses (including complications and co-morbidities):  Active Problems:   AKI (acute kidney injury) (HCC)   Protein-calorie malnutrition, severe   Acute metabolic encephalopathy   Failure to thrive in adult   Dementia without behavioral disturbance (HCC)   Essential hypertension   End of life care   Palliative care by specialist   DNR (do not resuscitate)   Cerebrovascular accident (CVA) due to bilateral embolism of middle cerebral arteries (HCC)   Goals of care, counseling/discussion   Brief Hospital Course (including significant findings, care, treatment, and services provided and events leading to death)  Jahmil CORTAVIOUS NIX is a 74 y.o. year old male who was admitted with acute metabolic encephalopathy and acute kidney injury. The patient was given IV fluids. The patient does have severe protein calorie malnutrition and failure to thrive. I saw the patient on 09/12/2020 and patient's mental status was still impaired. Patient was made n.p.o. MRI of the brain was ordered. MRI of the brain showed multiple bilateral acute infarcts affecting both MCA territories in the left PCA territory. Largest area of ischemia in the left frontal operculum extending into the insula and precentral gyrus. Patient did have her petechial hemorrhage and left frontal infarct. Also saw multiple old infarcts. The patient worsened with his status and was made comfort care measures and started on morphine drip. Appreciated help with palliative care and hospice. The patient passed away on 2020/09/19 at  1458.  The patient also had hyponatremia with sodium up at 150. The patient's creatinine was up at 2.92 on presentation and did improve to 1.88 on last blood draw. LDL was 88. The patient was also B12 deficient at 162. The patient's PSA was elevated at 7.74 which could go along with prostate cancer.      Pertinent Labs and Studies  Significant Diagnostic Studies CT ABDOMEN PELVIS WO CONTRAST  Result Date: 09/11/2020 CLINICAL DATA:  Unintended weight loss EXAM: CT CHEST, ABDOMEN AND PELVIS WITHOUT CONTRAST TECHNIQUE: Multidetector CT imaging of the chest, abdomen and pelvis was performed following the standard protocol without IV contrast. COMPARISON:  01/03/2013, chest x-ray from 15-Sep-2020 FINDINGS: CT CHEST FINDINGS Cardiovascular: Diffuse atherosclerotic calcifications are noted in the thoracic aorta. No definitive aneurysmal dilatation is noted. No cardiac enlargement is seen. Coronary calcifications are noted. No pericardial effusion is seen. Mediastinum/Nodes: Thoracic inlet is within normal limits. No hilar or mediastinal adenopathy is noted. The esophagus as visualized is within normal limits. Lungs/Pleura: Lungs are well aerated bilaterally. Mild emphysematous changes are noted. No focal confluent infiltrate or sizable effusion is seen. No parenchymal nodule is noted. Musculoskeletal: Degenerative changes of the thoracic spine are seen. No acute bony abnormality is noted. CT ABDOMEN PELVIS FINDINGS Hepatobiliary: Dependent gallstones are noted. No other focal abnormality is noted or gallbladder. Pancreas: Fatty interdigitation of the pancreas is noted. Spleen: Normal in size without focal abnormality. Adrenals/Urinary Tract: Adrenal glands are within normal limits. Kidneys demonstrate no mass lesion or hydronephrosis. No renal calculi are seen. Bladder is well distended although a Foley catheter is noted in place. Air is noted likely related to the  recent instrumentation. Stomach/Bowel:  Diverticular change of the colon is noted without evidence of diverticulitis. No definitive mass lesion or obstructive change is seen. The appendix is within normal limits. No small bowel or gastric abnormality is seen. Vascular/Lymphatic: Aortic atherosclerosis. No enlarged abdominal or pelvic lymph nodes. Reproductive: Prostate is unremarkable. Other: No abdominal wall hernia or abnormality. No abdominopelvic ascites. Musculoskeletal: No acute or significant osseous findings. IMPRESSION: CT of the chest: No acute abnormality noted. Aortic Atherosclerosis (ICD10-I70.0) and Emphysema (ICD10-J43.9). CT of the abdomen and pelvis: Cholelithiasis without complicating factors. Diverticulosis without diverticulitis. Electronically Signed   By: Alcide Clever M.D.   On: 09/11/2020 14:40   CT CHEST WO CONTRAST  Result Date: 09/11/2020 CLINICAL DATA:  Unintended weight loss EXAM: CT CHEST, ABDOMEN AND PELVIS WITHOUT CONTRAST TECHNIQUE: Multidetector CT imaging of the chest, abdomen and pelvis was performed following the standard protocol without IV contrast. COMPARISON:  01/03/2013, chest x-ray from 08-Oct-2020 FINDINGS: CT CHEST FINDINGS Cardiovascular: Diffuse atherosclerotic calcifications are noted in the thoracic aorta. No definitive aneurysmal dilatation is noted. No cardiac enlargement is seen. Coronary calcifications are noted. No pericardial effusion is seen. Mediastinum/Nodes: Thoracic inlet is within normal limits. No hilar or mediastinal adenopathy is noted. The esophagus as visualized is within normal limits. Lungs/Pleura: Lungs are well aerated bilaterally. Mild emphysematous changes are noted. No focal confluent infiltrate or sizable effusion is seen. No parenchymal nodule is noted. Musculoskeletal: Degenerative changes of the thoracic spine are seen. No acute bony abnormality is noted. CT ABDOMEN PELVIS FINDINGS Hepatobiliary: Dependent gallstones are noted. No other focal abnormality is noted or  gallbladder. Pancreas: Fatty interdigitation of the pancreas is noted. Spleen: Normal in size without focal abnormality. Adrenals/Urinary Tract: Adrenal glands are within normal limits. Kidneys demonstrate no mass lesion or hydronephrosis. No renal calculi are seen. Bladder is well distended although a Foley catheter is noted in place. Air is noted likely related to the recent instrumentation. Stomach/Bowel: Diverticular change of the colon is noted without evidence of diverticulitis. No definitive mass lesion or obstructive change is seen. The appendix is within normal limits. No small bowel or gastric abnormality is seen. Vascular/Lymphatic: Aortic atherosclerosis. No enlarged abdominal or pelvic lymph nodes. Reproductive: Prostate is unremarkable. Other: No abdominal wall hernia or abnormality. No abdominopelvic ascites. Musculoskeletal: No acute or significant osseous findings. IMPRESSION: CT of the chest: No acute abnormality noted. Aortic Atherosclerosis (ICD10-I70.0) and Emphysema (ICD10-J43.9). CT of the abdomen and pelvis: Cholelithiasis without complicating factors. Diverticulosis without diverticulitis. Electronically Signed   By: Alcide Clever M.D.   On: 09/11/2020 14:40   MR BRAIN WO CONTRAST  Result Date: 09/12/2020 CLINICAL DATA:  Weakness with inability to walk. EXAM: MRI HEAD WITHOUT CONTRAST MRI CERVICAL SPINE WITHOUT CONTRAST TECHNIQUE: Multiplanar, multiecho pulse sequences of the brain and surrounding structures, and cervical spine, to include the craniocervical junction and cervicothoracic junction, were obtained without intravenous contrast. COMPARISON:  None. FINDINGS: MRI HEAD FINDINGS Brain: There are multiple bilateral acute infarcts affecting both MCA territories and the left PCA territory. The largest area of ischemia is in the left frontal operculum extending to the insula. This area of infarction includes the left precentral gyrus. There are multiple old cerebellar infarcts and an  old left parietal infarct. Diffuse confluent hyperintense T2-weighted signal within the periventricular, deep and juxtacortical white matter. Advanced atrophy. There is petechial hemorrhage within the left frontal infarct. There is hemosiderin deposition over the posterior right hemisphere and posterior left parietal lobe. There are multiple chronic microhemorrhages of the pons  and cerebellum. Vascular: Normal flow voids. Skull and upper cervical spine: Normal marrow signal. Sinuses/Orbits: Negative. Other: None. MRI CERVICAL SPINE FINDINGS Alignment: Physiologic. Vertebrae: Mild left C3-4 facet edema.  No focal marrow lesion. Cord: Normal Posterior Fossa, vertebral arteries, paraspinal tissues: Multiple old cerebellar infarcts. Flow voids are normal. Disc levels: C1-2: Unremarkable. C2-3: Normal disc space and facet joints. There is no spinal canal stenosis. No neural foraminal stenosis. C3-4: Small disc bulge. Left facet hypertrophy with fluid in the facet joint. No spinal canal stenosis. Mild left neural foraminal stenosis. C4-5: Small disc bulge with endplate spurring. There is no spinal canal stenosis. No neural foraminal stenosis. C5-6: Small disc bulge. There is no spinal canal stenosis. No neural foraminal stenosis. C6-7: Small disc bulge. There is no spinal canal stenosis. No neural foraminal stenosis. C7-T1: Normal disc space and facet joints. There is no spinal canal stenosis. No neural foraminal stenosis. IMPRESSION: 1. Multiple bilateral acute infarcts affecting both MCA territories and the left PCA territory. The largest area of ischemia is in the left frontal operculum extending to the insula and precentral gyrus. 2. Petechial hemorrhage within the left frontal infarct. Heidelberg classification 1b: HI2, confluent petechiae, no mass effect. 3. Advanced atrophy with severe chronic ischemic white matter changes. 4. Multiple old infarcts. 5. No cervical spinal canal stenosis. Moderate facet arthrosis at  left C3-4 may be a source of local neck pain. Electronically Signed   By: Deatra Robinson M.D.   On: 09/12/2020 22:03   MR CERVICAL SPINE WO CONTRAST  Result Date: 09/12/2020 CLINICAL DATA:  Weakness with inability to walk. EXAM: MRI HEAD WITHOUT CONTRAST MRI CERVICAL SPINE WITHOUT CONTRAST TECHNIQUE: Multiplanar, multiecho pulse sequences of the brain and surrounding structures, and cervical spine, to include the craniocervical junction and cervicothoracic junction, were obtained without intravenous contrast. COMPARISON:  None. FINDINGS: MRI HEAD FINDINGS Brain: There are multiple bilateral acute infarcts affecting both MCA territories and the left PCA territory. The largest area of ischemia is in the left frontal operculum extending to the insula. This area of infarction includes the left precentral gyrus. There are multiple old cerebellar infarcts and an old left parietal infarct. Diffuse confluent hyperintense T2-weighted signal within the periventricular, deep and juxtacortical white matter. Advanced atrophy. There is petechial hemorrhage within the left frontal infarct. There is hemosiderin deposition over the posterior right hemisphere and posterior left parietal lobe. There are multiple chronic microhemorrhages of the pons and cerebellum. Vascular: Normal flow voids. Skull and upper cervical spine: Normal marrow signal. Sinuses/Orbits: Negative. Other: None. MRI CERVICAL SPINE FINDINGS Alignment: Physiologic. Vertebrae: Mild left C3-4 facet edema.  No focal marrow lesion. Cord: Normal Posterior Fossa, vertebral arteries, paraspinal tissues: Multiple old cerebellar infarcts. Flow voids are normal. Disc levels: C1-2: Unremarkable. C2-3: Normal disc space and facet joints. There is no spinal canal stenosis. No neural foraminal stenosis. C3-4: Small disc bulge. Left facet hypertrophy with fluid in the facet joint. No spinal canal stenosis. Mild left neural foraminal stenosis. C4-5: Small disc bulge with  endplate spurring. There is no spinal canal stenosis. No neural foraminal stenosis. C5-6: Small disc bulge. There is no spinal canal stenosis. No neural foraminal stenosis. C6-7: Small disc bulge. There is no spinal canal stenosis. No neural foraminal stenosis. C7-T1: Normal disc space and facet joints. There is no spinal canal stenosis. No neural foraminal stenosis. IMPRESSION: 1. Multiple bilateral acute infarcts affecting both MCA territories and the left PCA territory. The largest area of ischemia is in the left frontal operculum extending  to the insula and precentral gyrus. 2. Petechial hemorrhage within the left frontal infarct. Heidelberg classification 1b: HI2, confluent petechiae, no mass effect. 3. Advanced atrophy with severe chronic ischemic white matter changes. 4. Multiple old infarcts. 5. No cervical spinal canal stenosis. Moderate facet arthrosis at left C3-4 may be a source of local neck pain. Electronically Signed   By: Deatra RobinsonKevin  Herman M.D.   On: 09/12/2020 22:03   US RENAL  Result Date: 08/22/2020 CLINICAL DATA:  Acute kidney injury EXAM: RENAL / URINARY TRACT ULTRASOUND COMPLETE COMPARISON:  None. FINDINGS: Right Kidney: Renal measurements: 10.4 x 5.2 x 4.7 cm = volume: 134 mL. Hyperechoic. No mass or hydronephrosis visualized. Left Kidney: Renal measurements: 10.0 x 5.1 x 5.0 cm = volume: 135 mL. Hyperechoic. No mass or hydronephrosis visualized. Bladder: Decompressed by Foley catheter Other: None. IMPRESSION: Hyperechoic kidneys, consistent with medical renal disease. No other abnormality. Electronically Signed   By: Deatra RobinsonKevin  Herman M.D.   On: 09/11/2020 19:12   DG Chest Port 1 View  Result Date: 08/16/2020 CLINICAL DATA:  Lethargy and altered mental status. EXAM: PORTABLE CHEST 1 VIEW COMPARISON:  June 09, 2015 FINDINGS: Multiple overlying radiopaque cardiac lead wires are seen. The heart size and mediastinal contours are within normal limits. Tortuosity of the descending thoracic aorta is  seen. Both lungs are clear. The visualized skeletal structures are unremarkable. IMPRESSION: 1. No acute or active cardiopulmonary disease. Electronically Signed   By: Aram Candelahaddeus  Houston M.D.   On: 08/29/2020 16:09    Microbiology Recent Results (from the past 240 hour(s))  Respiratory Panel by RT PCR (Flu A&B, Covid) - Nasopharyngeal Swab     Status: None   Collection Time: 08/28/2020  3:29 PM   Specimen: Nasopharyngeal Swab  Result Value Ref Range Status   SARS Coronavirus 2 by RT PCR NEGATIVE NEGATIVE Final    Comment: (NOTE) SARS-CoV-2 target nucleic acids are NOT DETECTED.  The SARS-CoV-2 RNA is generally detectable in upper respiratoy specimens during the acute phase of infection. The lowest concentration of SARS-CoV-2 viral copies this assay can detect is 131 copies/mL. A negative result does not preclude SARS-Cov-2 infection and should not be used as the sole basis for treatment or other patient management decisions. A negative result may occur with  improper specimen collection/handling, submission of specimen other than nasopharyngeal swab, presence of viral mutation(s) within the areas targeted by this assay, and inadequate number of viral copies (<131 copies/mL). A negative result must be combined with clinical observations, patient history, and epidemiological information. The expected result is Negative.  Fact Sheet for Patients:  https://www.moore.com/https://www.fda.gov/media/142436/download  Fact Sheet for Healthcare Providers:  https://www.young.biz/https://www.fda.gov/media/142435/download  This test is no t yet approved or cleared by the [place] FDA and  has been authorized for detection and/or diagnosis of SARS-CoV-2 by FDA under an Emergency Use Authorization (EUA). This EUA will remain  in effect (meaning this test can be used) for the duration of the COVID-19 declaration under Section 564(b)(1) of the Act, 21 U.S.C. section 360bbb-3(b)(1), unless the authorization is terminated or revoked  sooner.     Influenza A by PCR NEGATIVE NEGATIVE Final   Influenza B by PCR NEGATIVE NEGATIVE Final    Comment: (NOTE) The Xpert Xpress SARS-CoV-2/FLU/RSV assay is intended as an aid in  the diagnosis of influenza from Nasopharyngeal swab specimens and  should not be used as a sole basis for treatment. Nasal washings and  aspirates are unacceptable for Xpert Xpress SARS-CoV-2/FLU/RSV  testing.  Fact Sheet for  Patients: https://www.moore.com/  Fact Sheet for Healthcare Providers: https://www.young.biz/  This test is not yet approved or cleared by the [place] FDA and  has been authorized for detection and/or diagnosis of SARS-CoV-2 by  FDA under an Emergency Use Authorization (EUA). This EUA will remain  in effect (meaning this test can be used) for the duration of the  Covid-19 declaration under Section 564(b)(1) of the Act, 21  U.S.C. section 360bbb-3(b)(1), unless the authorization is  terminated or revoked. Performed at [facility], [address] [place], [place] [address]   Blood culture (routine single)     Status: None (Preliminary result)   Collection Time: 08/27/2020  3:29 PM   Specimen: BLOOD  Result Value Ref Range Status   Specimen Description BLOOD BLOOD LEFT FOREARM  Final   Special Requests   Final    BOTTLES DRAWN AEROBIC AND ANAEROBIC Blood Culture results may not be optimal due to an inadequate volume of blood received in culture bottles   Culture   Final    NO GROWTH 4 DAYS Performed at [facility], [address] [place], [place] [address]    Report Status PENDING  Incomplete  Urine culture     Status: Abnormal   Collection Time: 09/03/2020  6:26 PM   Specimen: In/Out Cath Urine  Result Value Ref Range Status   Specimen Description   Final    IN/OUT CATH URINE Performed at [facility], [address] [place], [place] [address]    Special Requests   Final    NONE Performed  at [facility], [address] [place], [place] [address]    Culture MULTIPLE SPECIES PRESENT, SUGGEST RECOLLECTION (A)  Final   Report Status 09/11/2020 FINAL  Final    Lab Basic Metabolic Panel: Recent Labs  Lab 08/29/2020 1457 09/11/20 0513 09/12/20 0642 09/13/20 0530  NA 138 141 143 150*  K 4.3 4.6 4.3 4.3  CL 97* 100 104 111  CO2 26 27 19* 17*  GLUCOSE 341* 166* 171* 161*  BUN 107* 84* 56* 52*  CREATININE 2.92* 2.05* 1.95* 1.88*  CALCIUM 8.8* 8.6* 8.6* 8.5*  PHOS  --   --  3.7  --    Liver Function Tests: Recent Labs  Lab 09/08/2020 1457 09/12/20 0642  AST 39 109*  ALT 22 55*  ALKPHOS 94 80  BILITOT 1.2 1.7*  PROT 7.2 6.3*  ALBUMIN 3.3* 2.9*  2.9*   CBC: Recent Labs  Lab 08/29/2020 1457 09/11/20 0513 09/12/20 0642  WBC 8.4 7.9 8.9  HGB 17.0 16.4 16.1  HCT 51.0 47.1 46.4  MCV 87.0 85.2 85.8  PLT 152 118* 121*   Sepsis Labs: Recent Labs  Lab 08/16/2020 1457 09/11/20 0513 09/12/20 0642  WBC 8.4 7.9 8.9  LATICACIDVEN <0.3*  --   --      Alford Highland 2020/09/26, 5:38 PM

## 2020-10-15 NOTE — Progress Notes (Signed)
Daily Progress Note   Patient Name: Jacob White       Date: 09/23/2020 DOB: 06-19-1946  Age: 74 y.o. MRN#: [id number] Attending Physician: Alford Highland, MD Primary Care Physician: Patient, No Pcp Per Admit Date: 25-Sep-2020  Reason for Consultation/Follow-up: Establishing goals of care  Subjective: Patient unresponsive. Appears comfortable. Son and DIL at bedside.  Length of Stay: 4  Current Medications: Scheduled Meds:    Continuous Infusions: . morphine 2 mg/hr (09/19/2020 0100)    PRN Meds: acetaminophen **OR** acetaminophen, antiseptic oral rinse, glycopyrrolate **OR** glycopyrrolate **OR** glycopyrrolate, haloperidol **OR** haloperidol **OR** haloperidol lactate, morphine, ondansetron **OR** ondansetron (ZOFRAN) IV, polyvinyl alcohol  Physical Exam Constitutional:      Comments: unresponsive  Pulmonary:     Effort: Pulmonary effort is normal. No respiratory distress.     Comments: Even, unlabored Skin:    General: Skin is warm and dry.             Vital Signs: BP 95/62   Pulse 87   Temp 98.7 F (37.1 C) (Oral)   Resp 20   Ht 5\' 6"  (1.676 m)   Wt 51.1 kg   SpO2 (!) 85%   BMI 18.18 kg/m  SpO2: SpO2: (!) 85 % O2 Device: O2 Device: Room Air O2 Flow Rate:    Intake/output summary:   Intake/Output Summary (Last 24 hours) at 10/12/2020 1514 Last data filed at 09/17/2020 0100 Gross per 24 hour  Intake 29.81 ml  Output 175 ml  Net -145.19 ml   LBM:   Baseline Weight: Weight: 54.4 kg Most recent weight: Weight: 51.1 kg       Palliative Assessment/Data: PPS 10%    Flowsheet Rows     Most Recent Value  Intake Tab  Referral Department Hospitalist  Unit at Time of Referral Med/Surg Unit  Palliative Care Primary Diagnosis Neurology  Date Notified 09/11/20    Palliative Care Type New Palliative care  Reason for referral Clarify Goals of Care  Date of Admission 09/25/20  Date first seen by Palliative Care 09/12/20  # of days Palliative referral response time 1 Day(s)  # of days IP prior to Palliative referral 1  Clinical Assessment  Palliative Performance Scale Score 10%  Psychosocial & Spiritual Assessment  Palliative Care Outcomes  Patient/Family meeting held? Yes  Who was at the meeting? son  Palliative Care Outcomes Clarified goals of care, Provided advance care planning, Provided psychosocial or spiritual support, Changed CPR status      Patient Active Problem List   Diagnosis Date Noted  . 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 Goals of care, counseling/discussion   . Protein-calorie malnutrition, severe 09/12/2020  . Acute metabolic encephalopathy   . Failure to thrive in adult   . Dementia without behavioral disturbance (HCC)   . Essential hypertension   . End of life care   . Palliative care by specialist   . DNR (do not resuscitate)   . AKI (acute kidney injury) (HCC) 09/25/2020  . Stroke, acute, thrombotic, within last 8 weeks 06/13/2015  . Left hemiparesis (HCC)   . Poorly controlled type 2 diabetes mellitus (HCC)   . Cerebral  thrombosis with cerebral infarction (HCC) 06/11/2015  . MRSA bacteremia 06/11/2015  . Bacteremia 06/09/2015  . Weakness generalized 06/08/2015  . Confusion 06/08/2015  . Dehydration 06/08/2015  . Diabetes mellitus without complication (HCC) 06/08/2015  . Pain of right upper extremity 06/08/2015    Palliative Care Assessment & Plan   HPI: 74 y.o. male  with past medical history of T2DM, HTN, HLD, ?dementia, and leukemia admitted on 09-25-20 with poor PO intake, weakness, and weight loss.  Patient has remained lethargic during hospitalization. Unable to tolerate PO intake d/t mental status. Also with AKI. Suspected UTI - started on  rocephin. Elevated LFTs and elevated PSA. Pending MRI of brain and spine. PMT consulted to discuss GOC.   Assessment: Follow up with patient and family. Patient appears more comfortable today. Breathing unlabored. No furrowed brow today. Educated son and DIL on s/s of discomfort, educated he appears comfortable. All questions and concerns addressed. Still awaiting hospice bed.   Recommendations/Plan:  Continue full comfort measures   Referral to hospice facility - family aware patient may not survive until bed available or may not tolerate transport to facility  Goals of Care and Additional Recommendations:  Limitations on Scope of Treatment: Full Comfort Care  Code Status:  DNR  Prognosis:   Hours - Days  Discharge Planning:  Hospice facility vs hospital death  Care plan was discussed with patient's son and DIL, Dr. Renae Gloss, CSW and hospice liaison  Thank you for allowing the Palliative Medicine Team to assist in the care of this patient.   Total Time 17 minutes Prolonged Time Billed  no       Greater than 50%  of this time was spent counseling and coordinating care related to the above assessment and plan.   Gerlean Ren, DNP, [facility] Palliative Medicine Team Team Phone # [phone]  Pager [phone]

## 2020-10-15 NOTE — TOC Transition Note (Signed)
Transition of Care [facility]) - CM/SW Discharge Note   Patient Details  Name: Jacob White MRN: [id number] Date of Birth: [birth year]  [facility] CM/SW Contact:  Eilleen Kempf, LCSW Phone Number: Sep 27, 2020, 3:59 PM   Clinical Narrative:    CSW aware of hospice consult/evaluation with Clydie Braun and patient is currently waiting on hospice bed at hospice home.         Patient Goals and CMS Choice        Discharge Placement                       Discharge Plan and Services                                     Social Determinants of Health (SDOH) Interventions     Readmission Risk Interventions No flowsheet data found.

## 2020-10-15 DEATH — deceased

## 2021-02-20 IMAGING — MR MR HEAD W/O CM
11 series · 39 of 48 positions shown · non-contrast
Comparison: None.

CLINICAL DATA: Weakness with inability to walk.

EXAM:
MRI HEAD WITHOUT CONTRAST
MRI CERVICAL SPINE WITHOUT CONTRAST
TECHNIQUE: Multiplanar, multiecho pulse sequences of the brain and surrounding
structures, and cervical spine, to include the craniocervical
junction and cervicothoracic junction, were obtained without
intravenous contrast.

[Series 5: ax dwi_tracew · axial · 3.0mm · 0.60mm/px · z∈[-64,+90]mm · 4 of 48 slices shown]
[im 1/48]
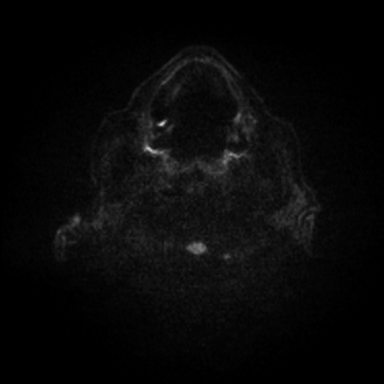
[im 16/48]
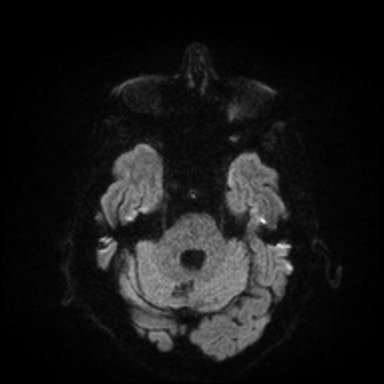
[im 32/48]
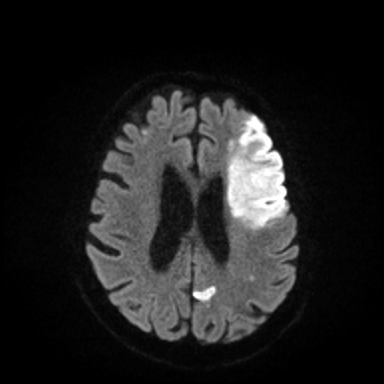
[im 48/48]
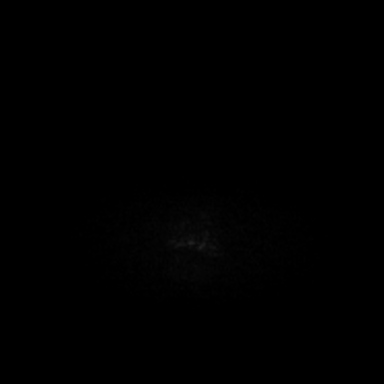

[Series 6: ax dwi_adc · axial · 3.0mm · 0.60mm/px · z∈[-64,+90]mm · 4 of 48 slices shown]
[im 1/48]
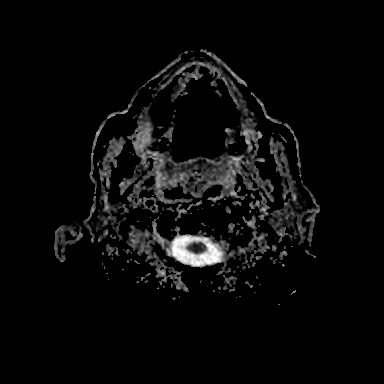
[im 16/48]
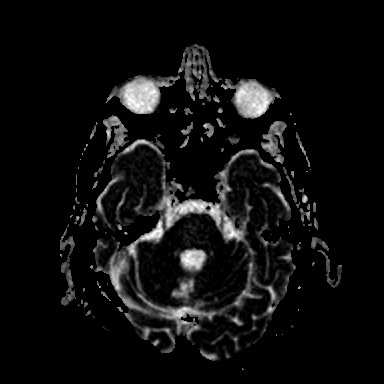
[im 32/48]
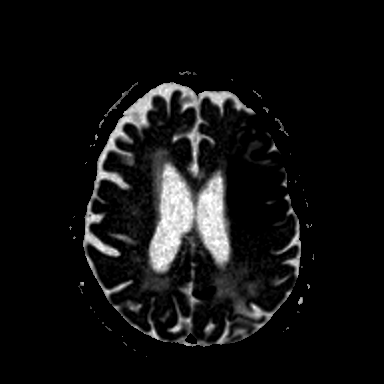
[im 48/48]
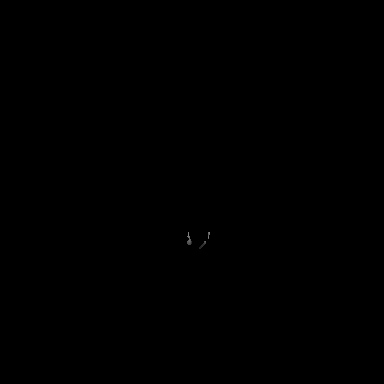

[Series 7: cor dwi_tracew · coronal · 5.0mm · 0.60mm/px · 3 of 38 slices shown]
[im 1/38]
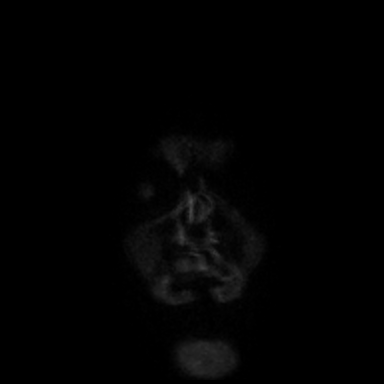
[im 19/38]
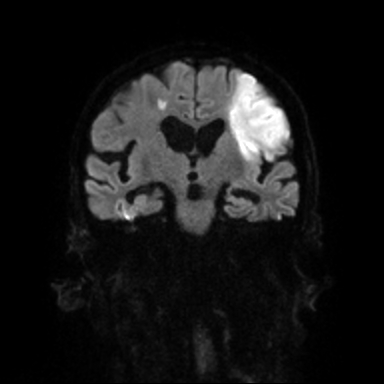
[im 38/38]
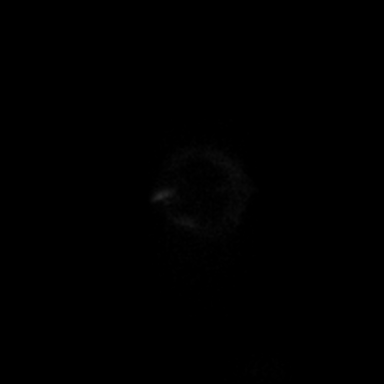

[Series 8: cor dwi_adc · coronal · 5.0mm · 0.60mm/px · 3 of 37 slices shown]
[im 1/37]
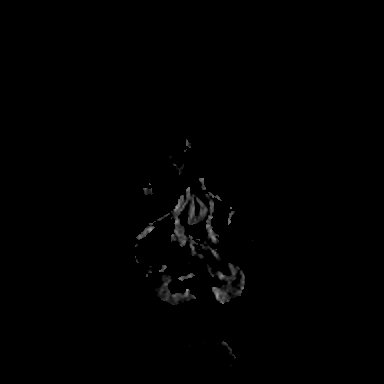
[im 19/37]
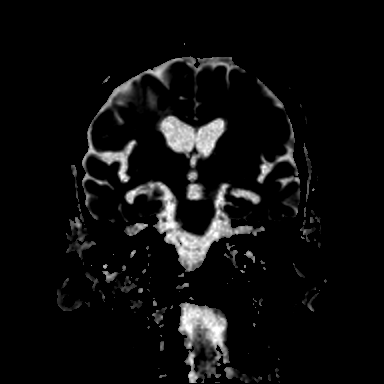
[im 37/37]
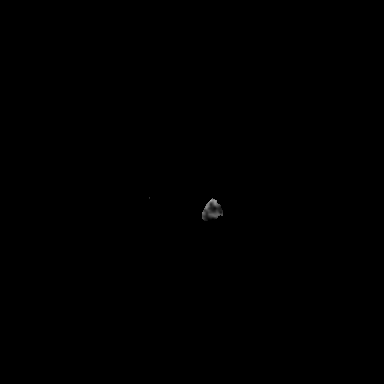

[Series 9: T1 · sagittal · 5.0mm · 0.62mm/px · 2 of 24 slices shown (1 of 2)]
[im 1/24]
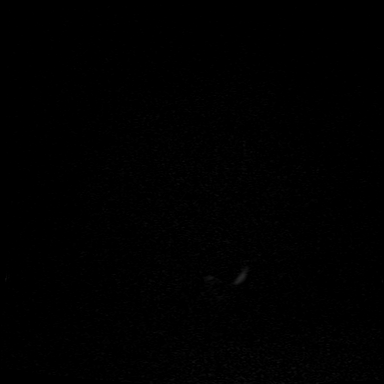
[im 24/24]
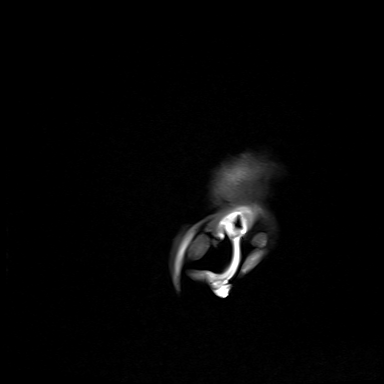

[Series 10: T2 · axial · 5.0mm · 0.53mm/px · z∈[-62,+87]mm · 2 of 26 slices shown (1 of 2)]
[im 1/26]
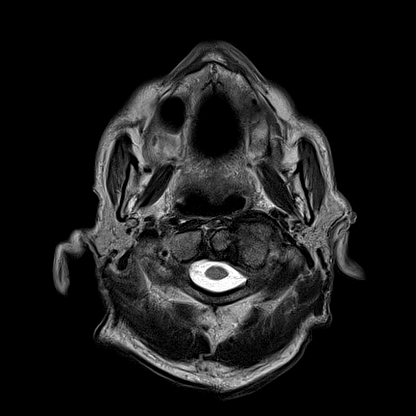
[im 26/26]
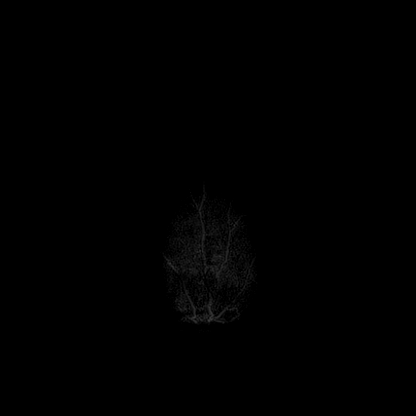

[Series 12: pha_images · axial · 3.0mm · 0.90mm/px · z∈[-74,+101]mm · 5 of 58 slices shown]
[im 1/58]
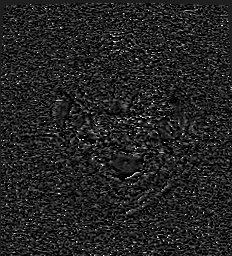
[im 15/58]
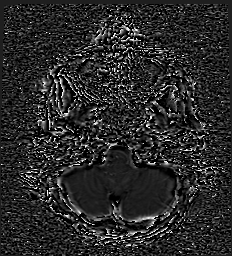
[im 29/58]
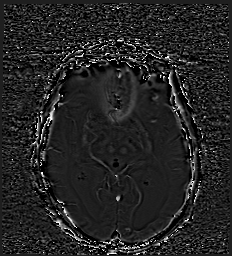
[im 43/58]
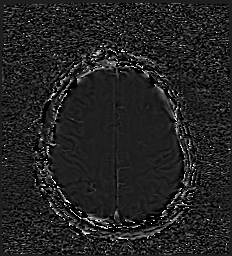
[im 58/58]
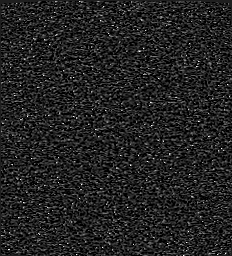

[Series 13: swi_images · axial · 3.0mm · 0.90mm/px · z∈[-74,-33]mm · 2 of 60 slices shown]
[im 1/60]
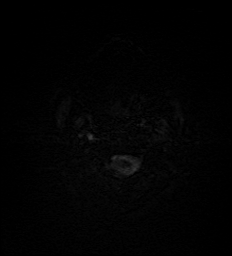
[im 15/60]
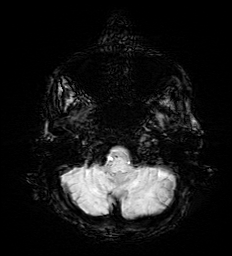

[Series 15: FLAIR · axial · 3.0mm · 0.53mm/px · z∈[-68,+93]mm · 4 of 55 slices shown]
[im 1/55]
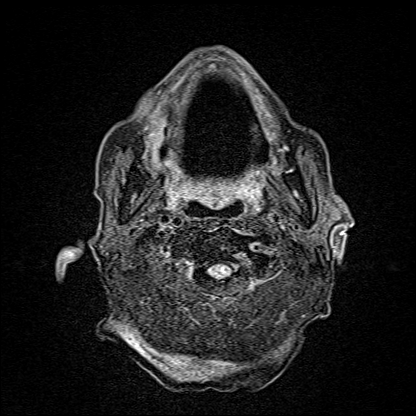
[im 19/55]
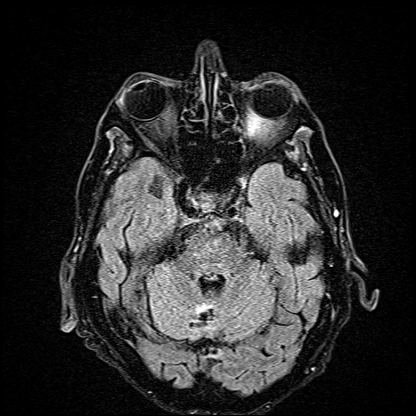
[im 37/55]
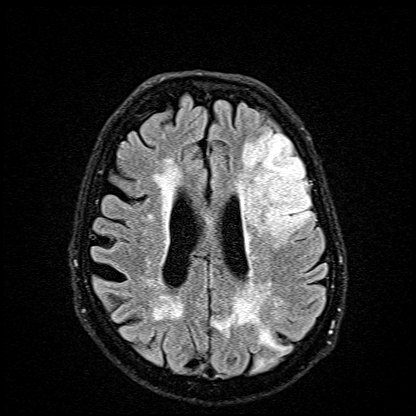
[im 55/55]
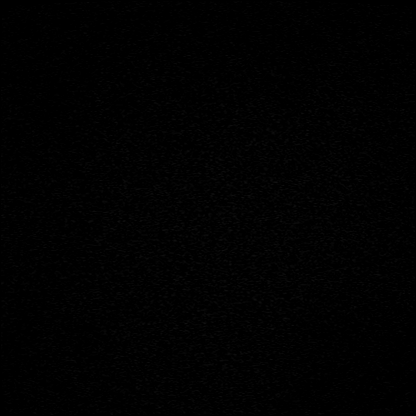

[Series 16: T1 · axial · 1.0mm · 0.98mm/px · z∈[-72,+97]mm · 8 of 172 slices shown (2 of 2)]
[im 1/172]
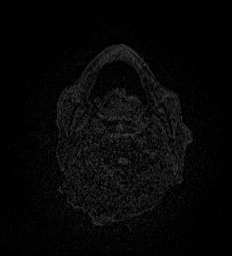
[im 27/172]
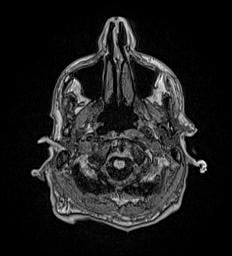
[im 53/172]
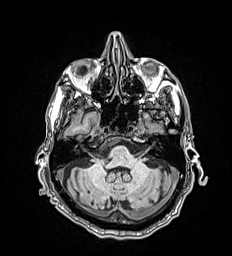
[im 79/172]
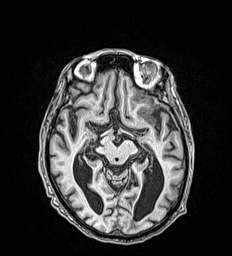
[im 93/172]
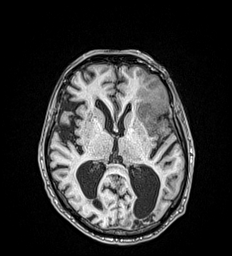
[im 119/172]
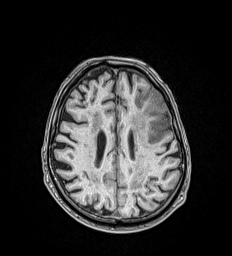
[im 145/172]
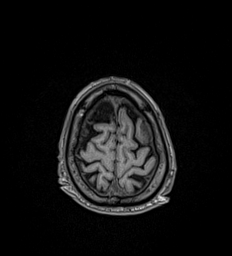
[im 172/172]
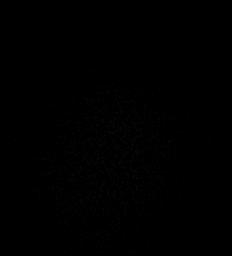

[Series 17: T2 · coronal · 5.0mm · 0.57mm/px · 2 of 29 slices shown (2 of 2)]
[im 1/29]
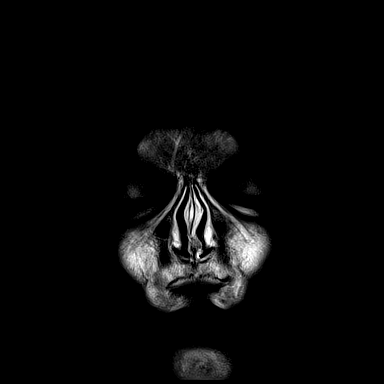
[im 29/29]
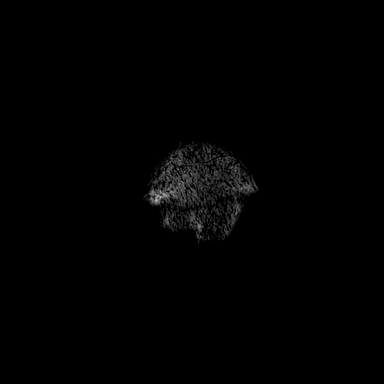

[39 of 48 positions shown; findings below may reference images not displayed]

FINDINGS: MRI HEAD FINDINGS

Brain: There are multiple bilateral acute infarcts affecting both
MCA territories and the left PCA territory. The largest area of
ischemia is in the left frontal operculum extending to the insula.
This area of infarction includes the left precentral gyrus. There
are multiple old cerebellar infarcts and an old left parietal
infarct. Diffuse confluent hyperintense T2-weighted signal within
the periventricular, deep and juxtacortical white matter. Advanced
atrophy. There is petechial hemorrhage within the left frontal
infarct. There is hemosiderin deposition over the posterior right
hemisphere and posterior left parietal lobe. There are multiple
chronic microhemorrhages of the pons and cerebellum.

Vascular: Normal flow voids.

Skull and upper cervical spine: Normal marrow signal.

Sinuses/Orbits: Negative.

Other: None.

MRI CERVICAL SPINE FINDINGS

Alignment: Physiologic.

Vertebrae: Mild left C3-4 facet edema.  No focal marrow lesion.

Cord: Normal

Posterior Fossa, vertebral arteries, paraspinal tissues: Multiple
old cerebellar infarcts. Flow voids are normal.

Disc levels:

C1-2: Unremarkable.

C2-3: Normal disc space and facet joints. There is no spinal canal
stenosis. No neural foraminal stenosis.

C3-4: Small disc bulge. Left facet hypertrophy with fluid in the
facet joint. No spinal canal stenosis. Mild left neural foraminal
stenosis.

C4-5: Small disc bulge with endplate spurring. There is no spinal
canal stenosis. No neural foraminal stenosis.

C5-6: Small disc bulge. There is no spinal canal stenosis. No neural
foraminal stenosis.

C6-7: Small disc bulge. There is no spinal canal stenosis. No neural
foraminal stenosis.

C7-T1: Normal disc space and facet joints. There is no spinal canal
stenosis. No neural foraminal stenosis.
IMPRESSION: 1. Multiple bilateral acute infarcts affecting both MCA territories
and the left PCA territory. The largest area of ischemia is in the
left frontal operculum extending to the insula and precentral gyrus.
2. Petechial hemorrhage within the left frontal infarct. Heidelberg
classification 1b: HI2, confluent petechiae, no mass effect.
3. Advanced atrophy with severe chronic ischemic white matter
changes.
4. Multiple old infarcts.
5. No cervical spinal canal stenosis. Moderate facet arthrosis at
left C[DATE] be a source of local neck pain.
